# Patient Record
Sex: Female | Born: 1965 | Race: White | Hispanic: No | Marital: Married | Smoking: Never smoker
Health system: Southern US, Community
[De-identification: ages and names within clinical notes are randomized; demographics above are authoritative.]

## PROBLEM LIST (undated history)

## (undated) DIAGNOSIS — R87619 Unspecified abnormal cytological findings in specimens from cervix uteri: Secondary | ICD-10-CM

## (undated) DIAGNOSIS — I10 Essential (primary) hypertension: Secondary | ICD-10-CM

## (undated) DIAGNOSIS — T7840XA Allergy, unspecified, initial encounter: Secondary | ICD-10-CM

## (undated) DIAGNOSIS — IMO0002 Reserved for concepts with insufficient information to code with codable children: Secondary | ICD-10-CM

## (undated) HISTORY — PX: TRIGGER FINGER RELEASE: SHX641

## (undated) HISTORY — DX: Unspecified abnormal cytological findings in specimens from cervix uteri: R87.619

## (undated) HISTORY — PX: REFRACTIVE SURGERY: SHX103

## (undated) HISTORY — DX: Reserved for concepts with insufficient information to code with codable children: IMO0002

## (undated) HISTORY — PX: KNEE SURGERY: SHX244

## (undated) HISTORY — DX: Allergy, unspecified, initial encounter: T78.40XA

## (undated) HISTORY — PX: WISDOM TOOTH EXTRACTION: SHX21

## (undated) HISTORY — PX: CARPAL TUNNEL WITH CUBITAL TUNNEL: SHX5608

## (undated) HISTORY — PX: EYE SURGERY: SHX253

---

## 1998-09-17 ENCOUNTER — Other Ambulatory Visit: Admission: RE | Admit: 1998-09-17 | Discharge: 1998-09-17 | Payer: Self-pay | Admitting: Obstetrics and Gynecology

## 1999-08-28 ENCOUNTER — Emergency Department (HOSPITAL_COMMUNITY): Admission: EM | Admit: 1999-08-28 | Discharge: 1999-08-28 | Payer: Self-pay | Admitting: Emergency Medicine

## 1999-08-28 ENCOUNTER — Encounter: Payer: Self-pay | Admitting: Emergency Medicine

## 1999-11-23 ENCOUNTER — Other Ambulatory Visit: Admission: RE | Admit: 1999-11-23 | Discharge: 1999-11-23 | Payer: Self-pay | Admitting: Obstetrics and Gynecology

## 2000-09-02 ENCOUNTER — Inpatient Hospital Stay (HOSPITAL_COMMUNITY): Admission: AD | Admit: 2000-09-02 | Discharge: 2000-09-02 | Payer: Self-pay | Admitting: *Deleted

## 2000-09-29 ENCOUNTER — Inpatient Hospital Stay (HOSPITAL_COMMUNITY): Admission: RE | Admit: 2000-09-29 | Discharge: 2000-09-29 | Payer: Self-pay | Admitting: Obstetrics and Gynecology

## 2000-12-05 ENCOUNTER — Other Ambulatory Visit: Admission: RE | Admit: 2000-12-05 | Discharge: 2000-12-05 | Payer: Self-pay | Admitting: Obstetrics and Gynecology

## 2000-12-13 ENCOUNTER — Encounter: Payer: Self-pay | Admitting: Urology

## 2000-12-13 ENCOUNTER — Encounter: Admission: RE | Admit: 2000-12-13 | Discharge: 2000-12-13 | Payer: Self-pay | Admitting: Urology

## 2000-12-21 ENCOUNTER — Encounter: Payer: Self-pay | Admitting: Obstetrics and Gynecology

## 2000-12-21 ENCOUNTER — Encounter: Admission: RE | Admit: 2000-12-21 | Discharge: 2000-12-21 | Payer: Self-pay | Admitting: Obstetrics and Gynecology

## 2001-03-15 ENCOUNTER — Other Ambulatory Visit: Admission: RE | Admit: 2001-03-15 | Discharge: 2001-03-15 | Payer: Self-pay | Admitting: Obstetrics and Gynecology

## 2001-07-02 ENCOUNTER — Inpatient Hospital Stay (HOSPITAL_COMMUNITY): Admission: AD | Admit: 2001-07-02 | Discharge: 2001-07-02 | Payer: Self-pay | Admitting: Obstetrics & Gynecology

## 2001-07-03 ENCOUNTER — Inpatient Hospital Stay (HOSPITAL_COMMUNITY): Admission: AD | Admit: 2001-07-03 | Discharge: 2001-07-03 | Payer: Self-pay | Admitting: Obstetrics and Gynecology

## 2001-07-26 ENCOUNTER — Encounter: Admission: RE | Admit: 2001-07-26 | Discharge: 2001-10-24 | Payer: Self-pay | Admitting: Obstetrics and Gynecology

## 2001-08-12 ENCOUNTER — Inpatient Hospital Stay (HOSPITAL_COMMUNITY): Admission: AD | Admit: 2001-08-12 | Discharge: 2001-08-12 | Payer: Self-pay | Admitting: Obstetrics and Gynecology

## 2001-09-17 ENCOUNTER — Inpatient Hospital Stay (HOSPITAL_COMMUNITY): Admission: AD | Admit: 2001-09-17 | Discharge: 2001-09-17 | Payer: Self-pay | Admitting: Obstetrics and Gynecology

## 2001-09-18 ENCOUNTER — Inpatient Hospital Stay (HOSPITAL_COMMUNITY): Admission: AD | Admit: 2001-09-18 | Discharge: 2001-09-20 | Payer: Self-pay | Admitting: Obstetrics and Gynecology

## 2001-12-12 ENCOUNTER — Other Ambulatory Visit: Admission: RE | Admit: 2001-12-12 | Discharge: 2001-12-12 | Payer: Self-pay | Admitting: Obstetrics and Gynecology

## 2002-10-01 ENCOUNTER — Ambulatory Visit (HOSPITAL_COMMUNITY): Admission: RE | Admit: 2002-10-01 | Discharge: 2002-10-01 | Payer: Self-pay | Admitting: Gastroenterology

## 2002-12-18 ENCOUNTER — Other Ambulatory Visit: Admission: RE | Admit: 2002-12-18 | Discharge: 2002-12-18 | Payer: Self-pay | Admitting: Obstetrics and Gynecology

## 2003-12-30 ENCOUNTER — Other Ambulatory Visit: Admission: RE | Admit: 2003-12-30 | Discharge: 2003-12-30 | Payer: Self-pay | Admitting: Obstetrics and Gynecology

## 2004-01-14 ENCOUNTER — Encounter: Admission: RE | Admit: 2004-01-14 | Discharge: 2004-01-14 | Payer: Self-pay | Admitting: Obstetrics and Gynecology

## 2004-12-30 ENCOUNTER — Other Ambulatory Visit: Admission: RE | Admit: 2004-12-30 | Discharge: 2004-12-30 | Payer: Self-pay | Admitting: Obstetrics and Gynecology

## 2005-11-08 ENCOUNTER — Encounter: Admission: RE | Admit: 2005-11-08 | Discharge: 2005-11-08 | Payer: Self-pay | Admitting: Obstetrics and Gynecology

## 2005-11-08 IMAGING — MG MM MAMMO SCREENING
6 series · 6 of 6 positions shown · non-contrast
Comparison: none

SCREENING MAMMOGRAM:

Comparison is made to previous study dated [DATE].
The breast tissue is extremely dense.  There is no dominant mass, architectural distortion or 
calcification to suggest malignancy.

[R CC]
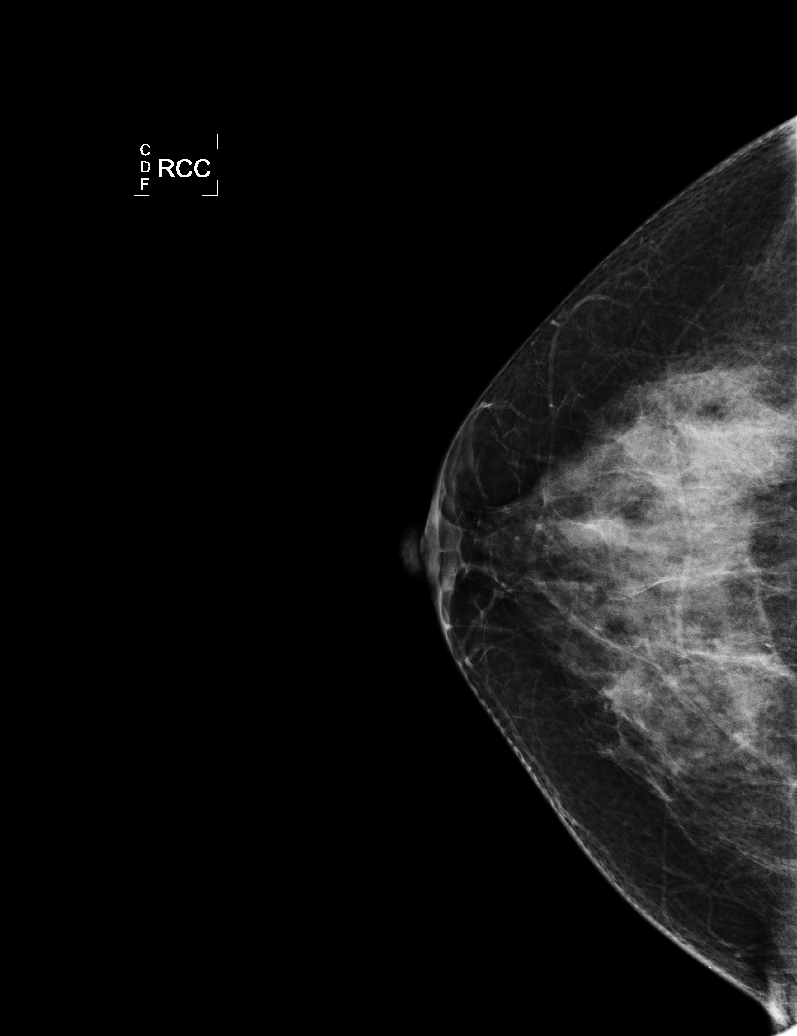

[R MLO (1 of 2)]
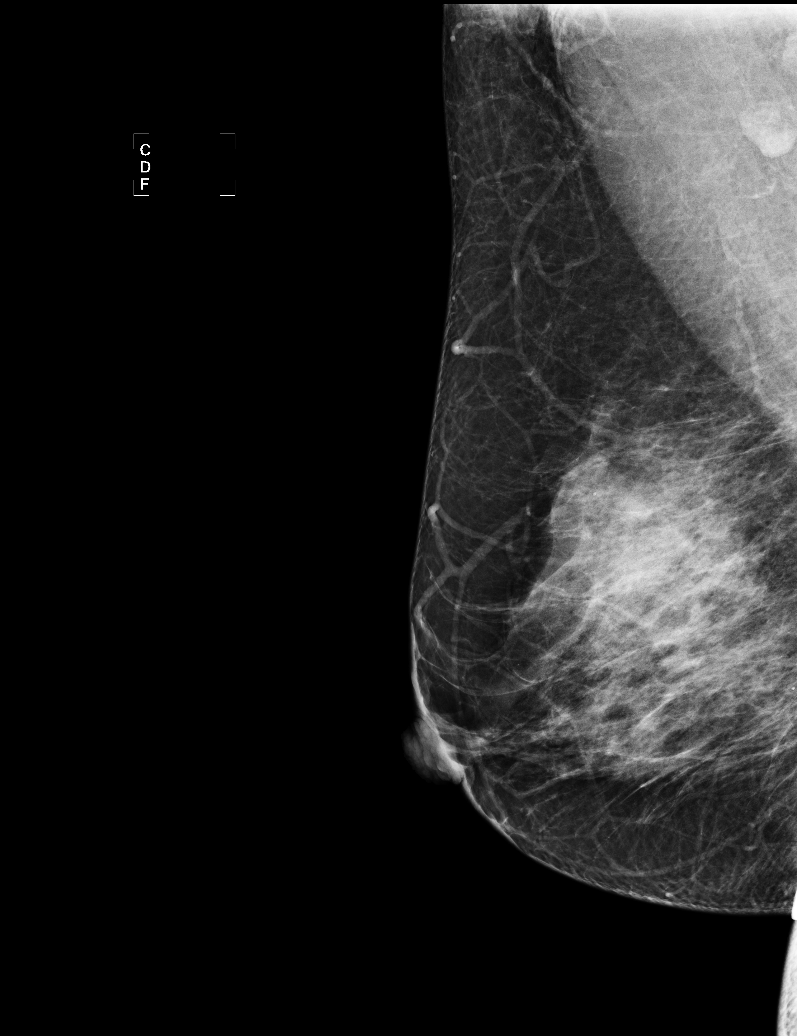

[L CC]
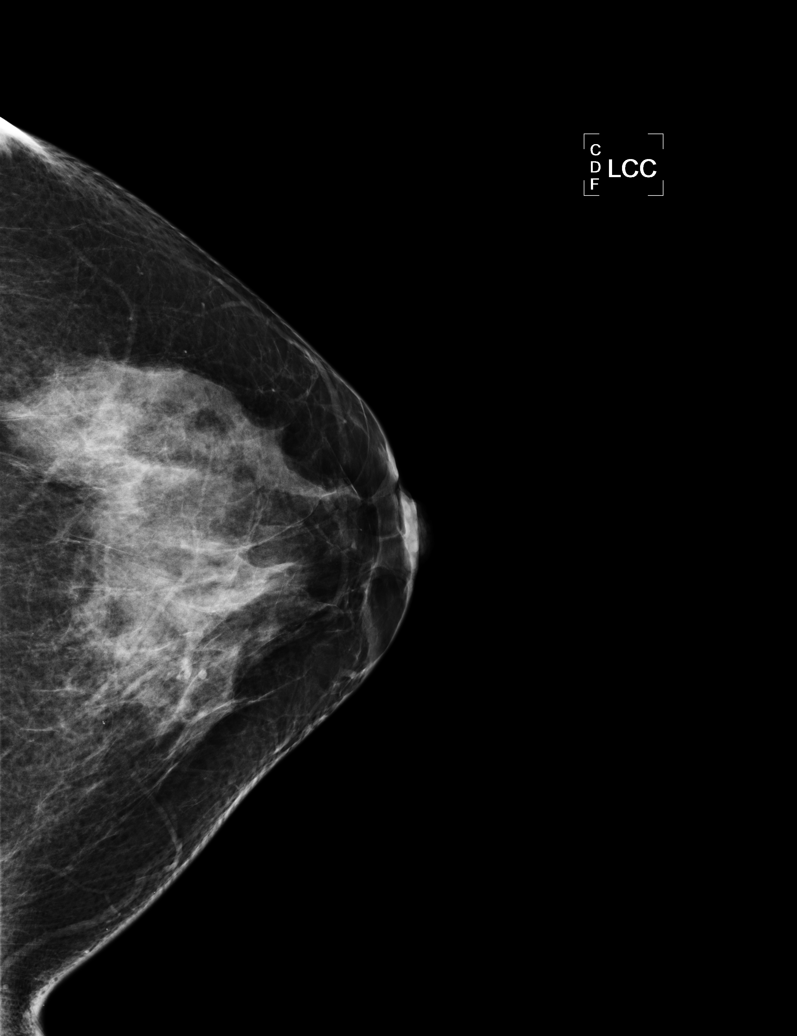

[L MLO (1 of 2)]
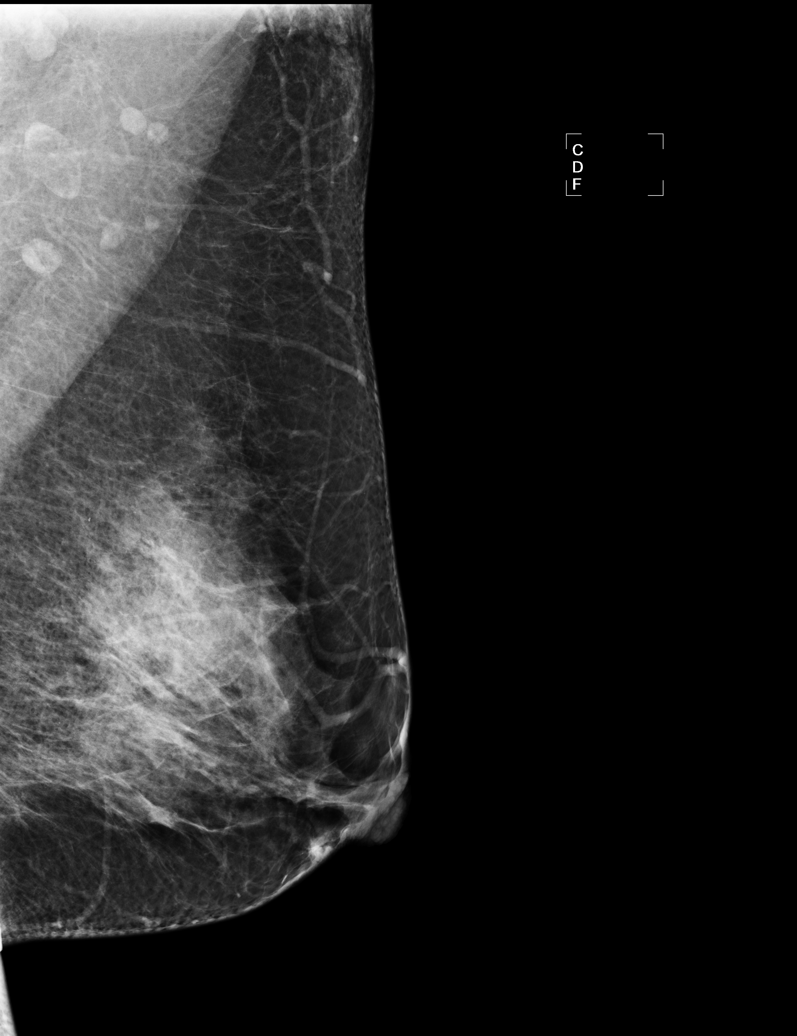

[R MLO (2 of 2)]
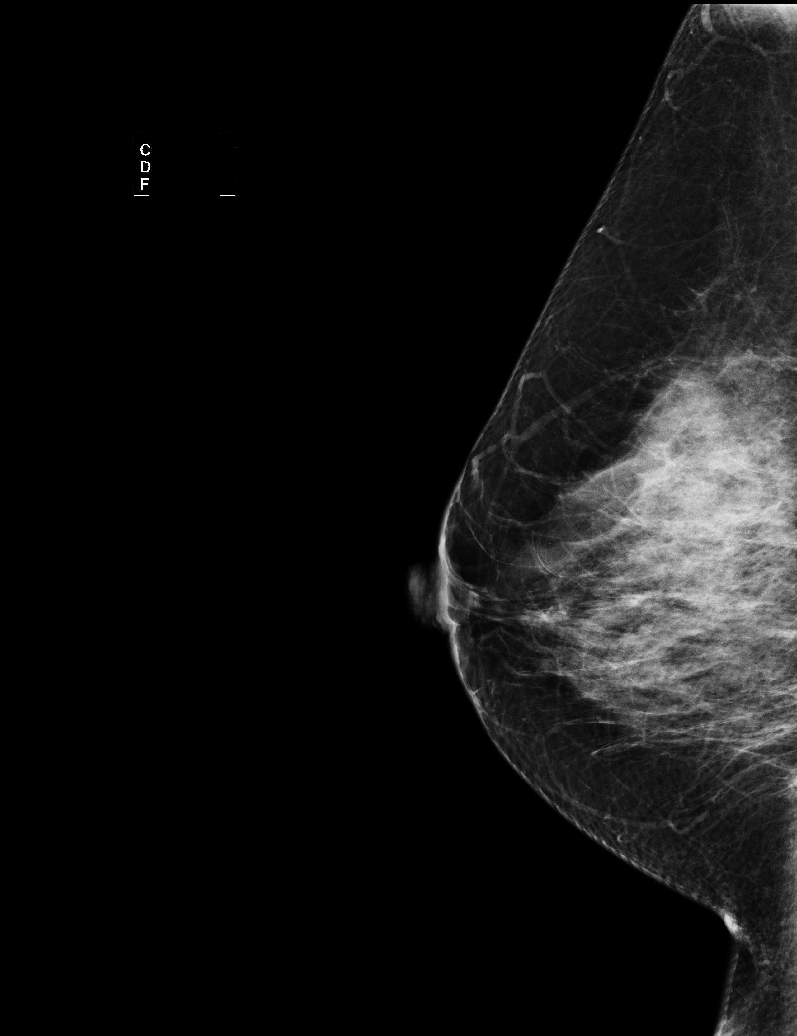

[L MLO (2 of 2)]
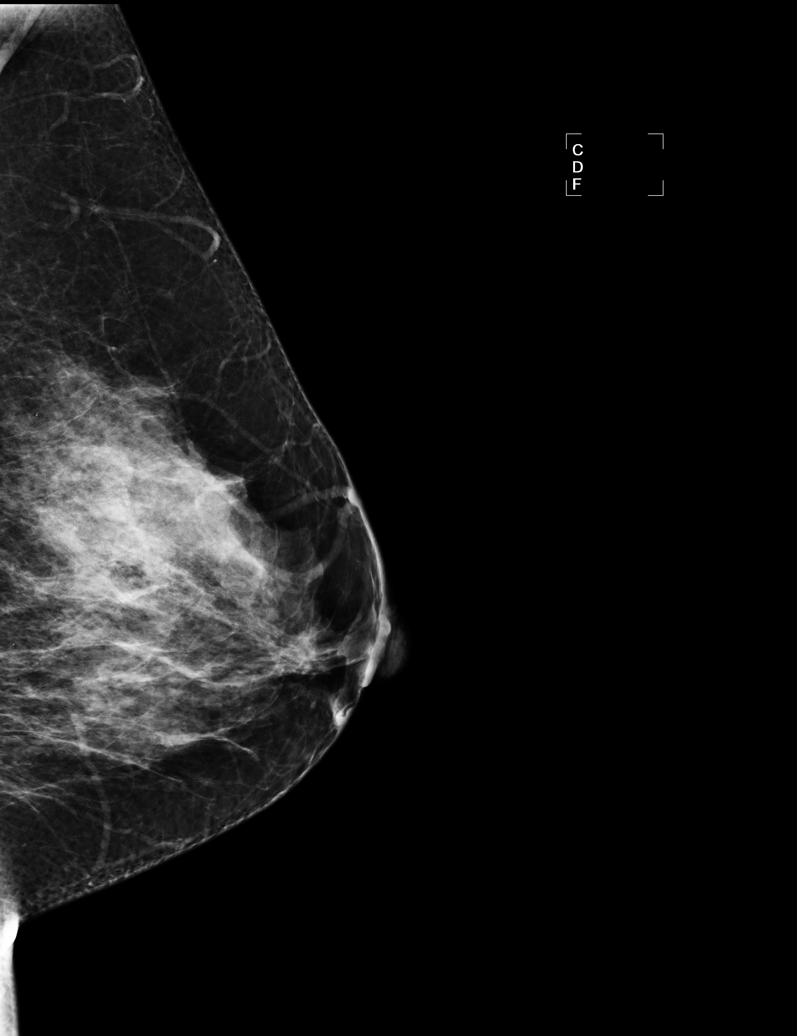

[6 of 6 positions shown; findings below may reference images not displayed]

IMPRESSION: No mammographic evidence of malignancy.  Suggest screening mammography in one year.

ASSESSMENT: Negative - BI-RADS 1

Screening mammogram in 1 year.

## 2006-01-04 ENCOUNTER — Other Ambulatory Visit: Admission: RE | Admit: 2006-01-04 | Discharge: 2006-01-04 | Payer: Self-pay | Admitting: Obstetrics and Gynecology

## 2007-01-02 ENCOUNTER — Encounter: Admission: RE | Admit: 2007-01-02 | Discharge: 2007-01-02 | Payer: Self-pay | Admitting: Obstetrics and Gynecology

## 2007-01-02 IMAGING — MG MM SCREEN MAMMOGRAM BILATERAL
6 series · 6 of 6 positions shown · non-contrast
Comparison: none

DG SCREEN MAMMOGRAM BILATERAL
Bilateral CC and MLO view(s) were taken.
Technologist: CLEMENCEAU

DIGITAL SCREENING MAMMOGRAM WITH CAD:
There is a  dense fibroglandular pattern.  No masses or malignant type calcifications are 
identified.  Compared with prior studies.

[R CC (1 of 2)]
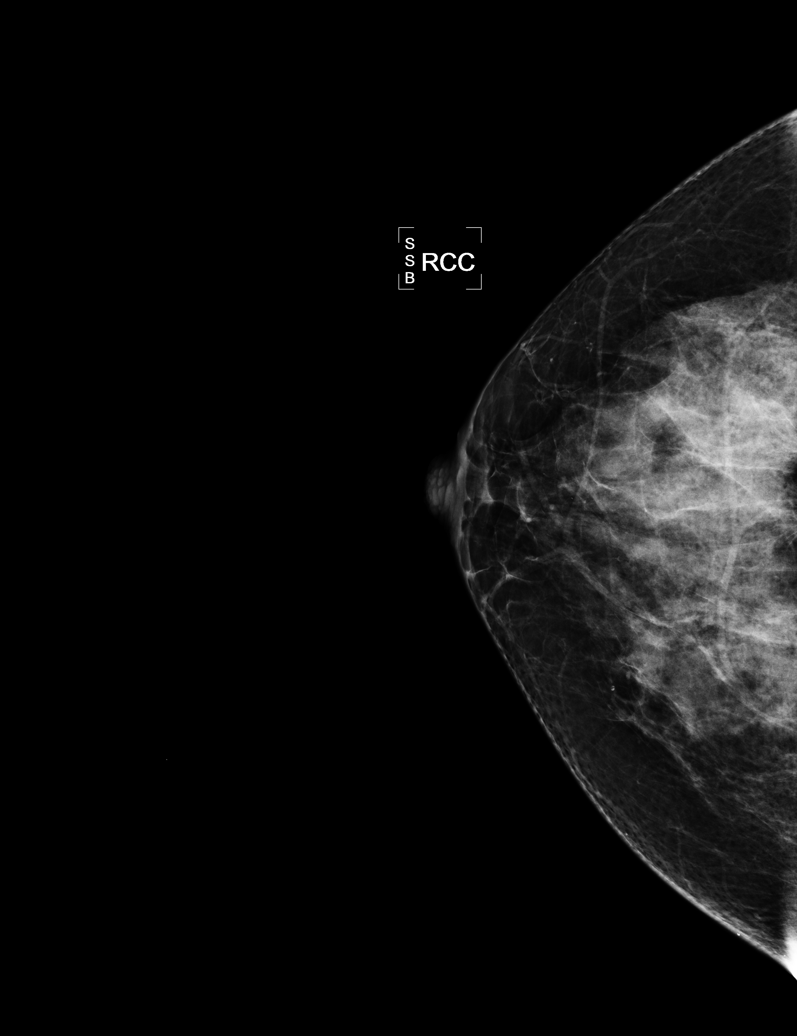

[L CC (1 of 2)]
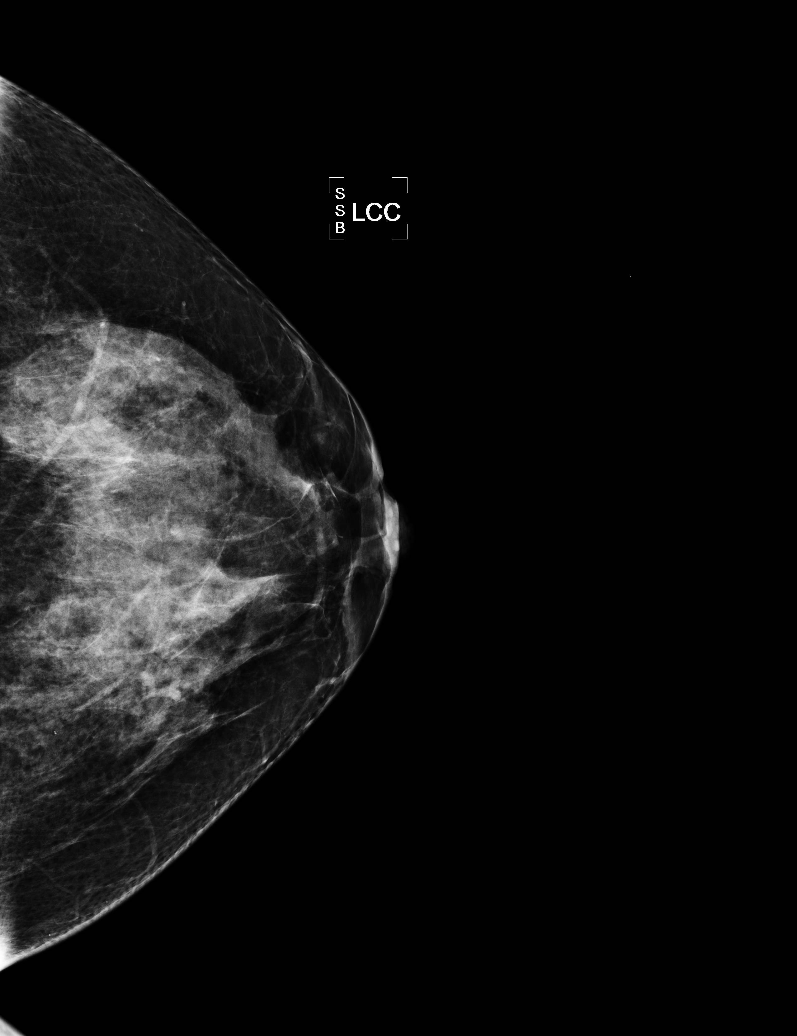

[L MLO]
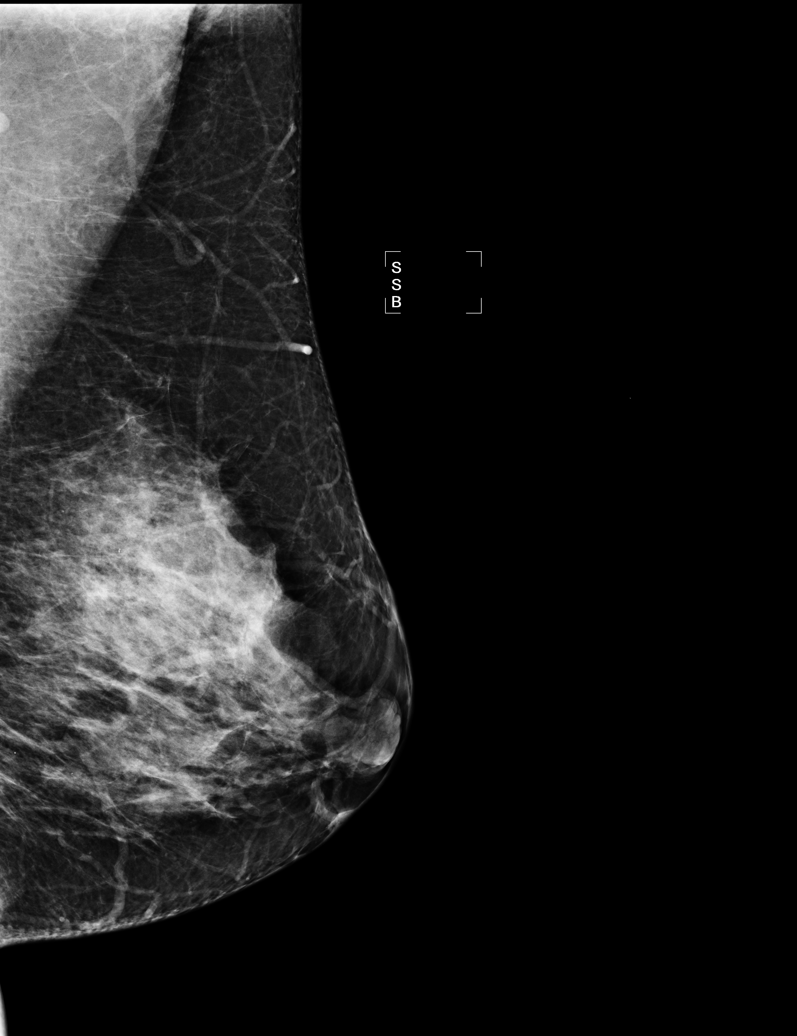

[R MLO]
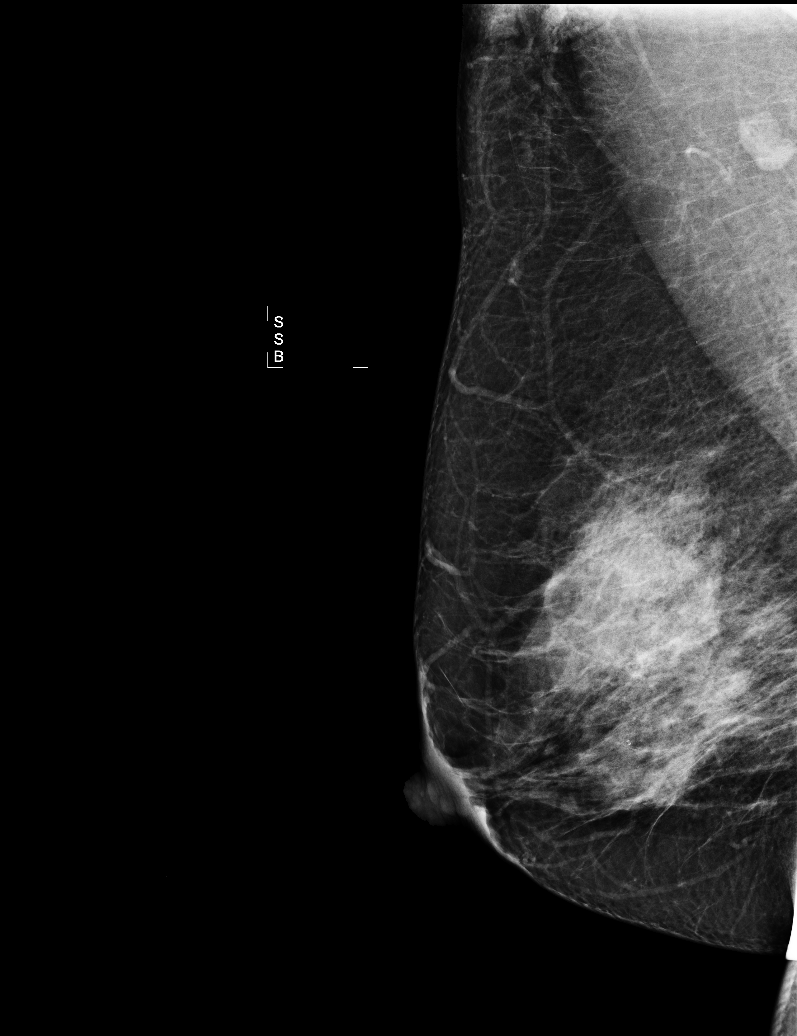

[R CC (2 of 2)]
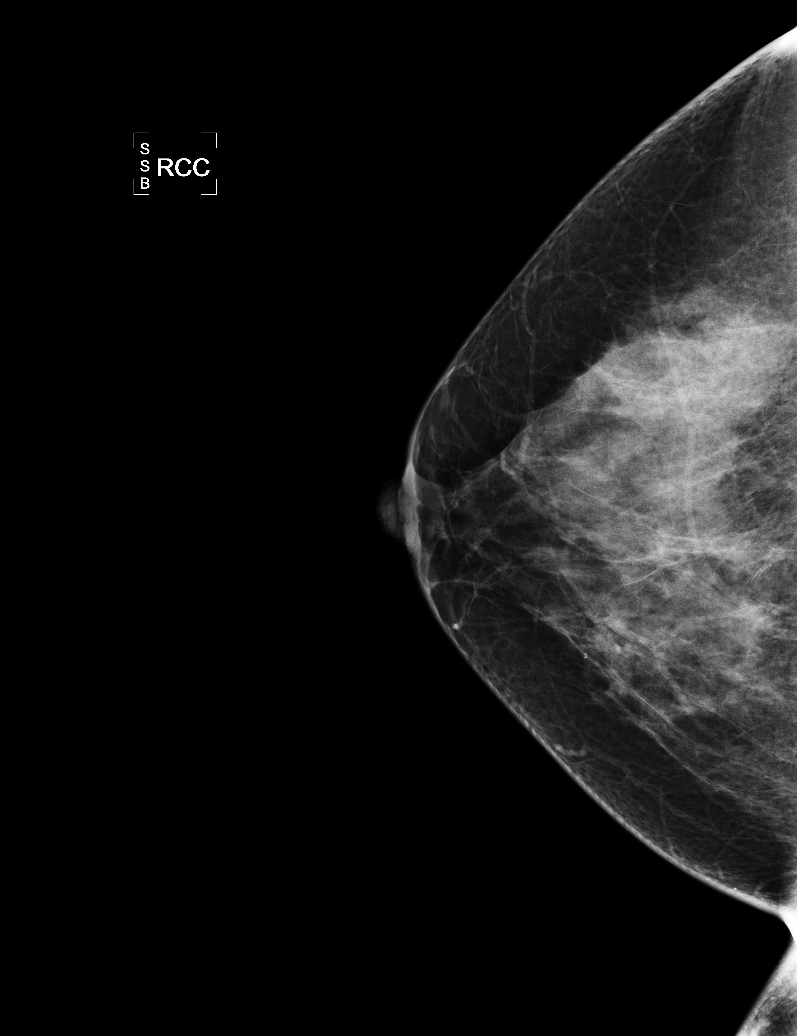

[L CC (2 of 2)]
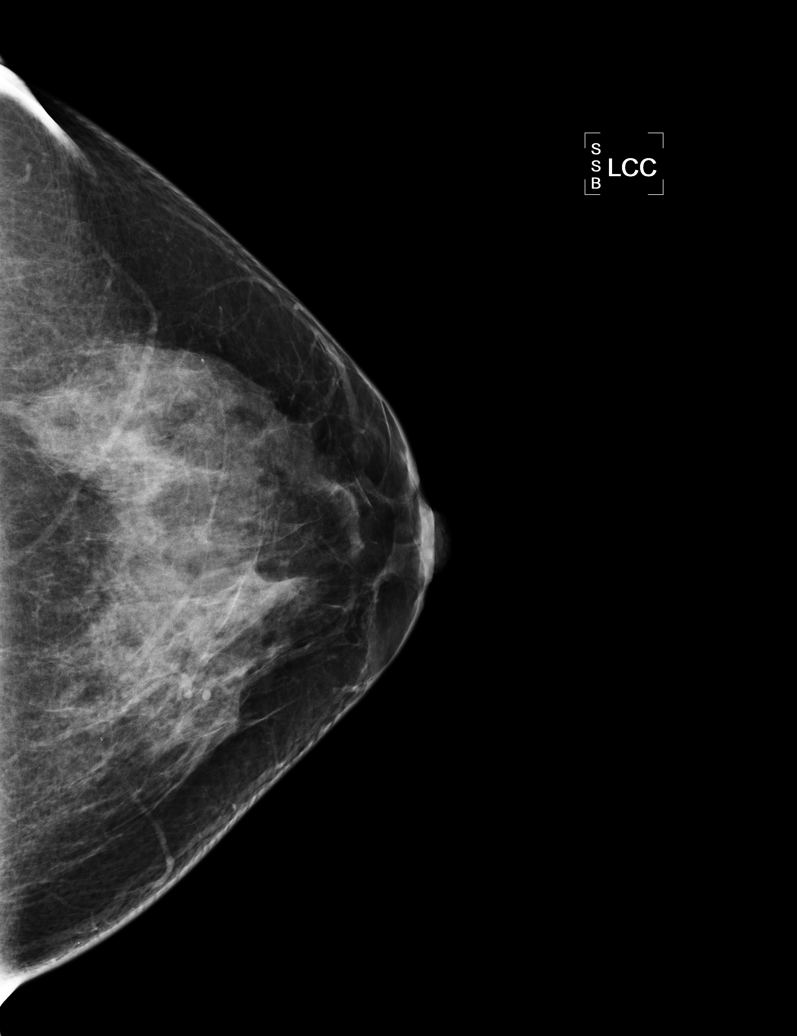

[6 of 6 positions shown; findings below may reference images not displayed]

IMPRESSION: No specific mammographic evidence of malignancy.  Next screening mammogram is recommended in one 
year.

ASSESSMENT: Negative - BI-RADS 1

Screening mammogram in 1 year.
ANALYZED BY COMPUTER AIDED DETECTION. , THIS PROCEDURE WAS A DIGITAL MAMMOGRAM.

## 2007-06-23 ENCOUNTER — Emergency Department (HOSPITAL_COMMUNITY): Admission: EM | Admit: 2007-06-23 | Discharge: 2007-06-23 | Payer: Self-pay | Admitting: Emergency Medicine

## 2007-08-19 ENCOUNTER — Emergency Department (HOSPITAL_COMMUNITY): Admission: EM | Admit: 2007-08-19 | Discharge: 2007-08-19 | Payer: Self-pay | Admitting: Emergency Medicine

## 2007-08-19 IMAGING — CR DG CHEST 1V PORT
1 series · 1 of 1 positions shown · non-contrast
Comparison: None.

CLINICAL DATA: 41-year-old female with chest pain. 
 PORTABLE CHEST - 1 VIEW:

[AP]
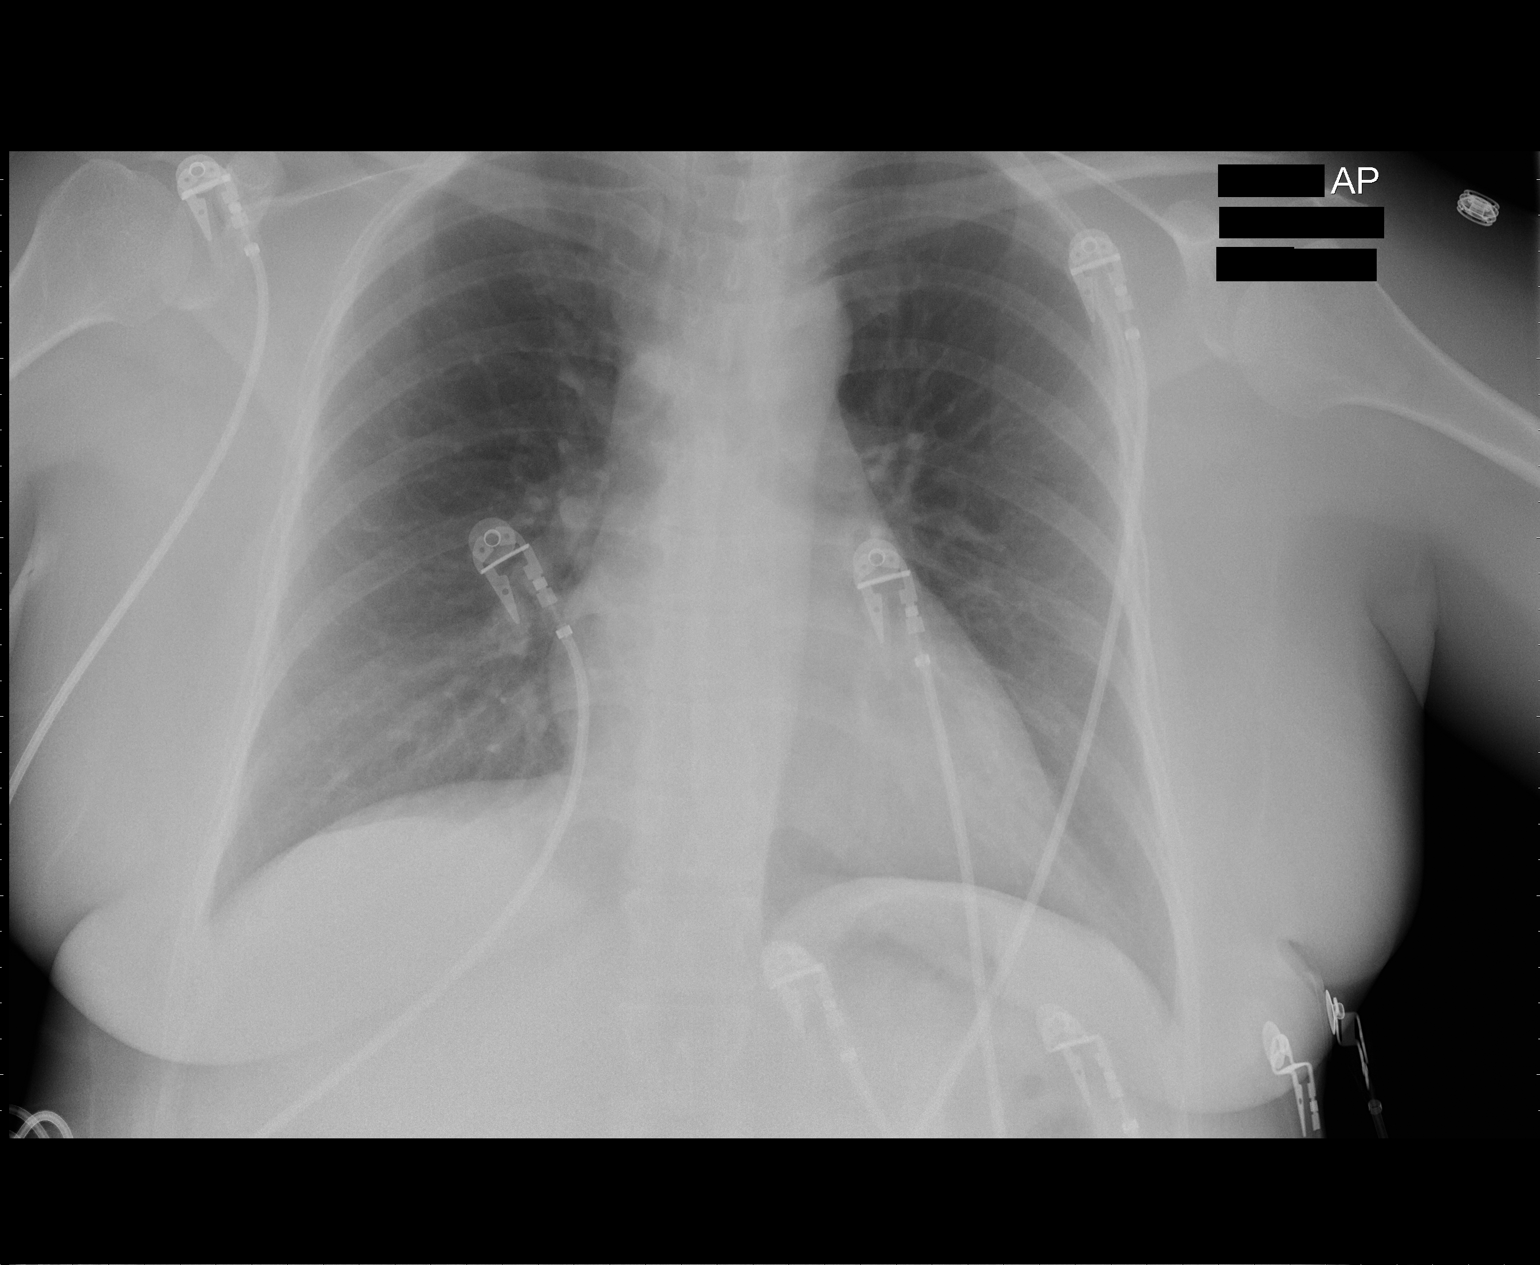

[1 of 1 positions shown; findings below may reference images not displayed]

FINDINGS: The heart size and mediastinal contours are within normal limits.  Both lungs are clear.
IMPRESSION: No acute findings.

## 2007-09-04 ENCOUNTER — Ambulatory Visit: Payer: Self-pay | Admitting: Internal Medicine

## 2007-09-12 ENCOUNTER — Ambulatory Visit: Payer: Self-pay

## 2007-10-31 ENCOUNTER — Ambulatory Visit: Payer: Self-pay | Admitting: Internal Medicine

## 2008-01-08 ENCOUNTER — Encounter: Admission: RE | Admit: 2008-01-08 | Discharge: 2008-01-08 | Payer: Self-pay | Admitting: Obstetrics and Gynecology

## 2008-01-08 IMAGING — MG MM DIGITAL SCREENING
5 series · 5 of 5 positions shown · non-contrast
Comparison: Prior studies.

DG SCREEN MAMMOGRAM BILATERAL
Bilateral CC and MLO view(s) were taken.
Technologist: TORRES
Prior study comparison: [DATE], bilateral screening mammogram, performed at the [REDACTED].

DIGITAL SCREENING MAMMOGRAM WITH CAD:

[R CC (1 of 2)]
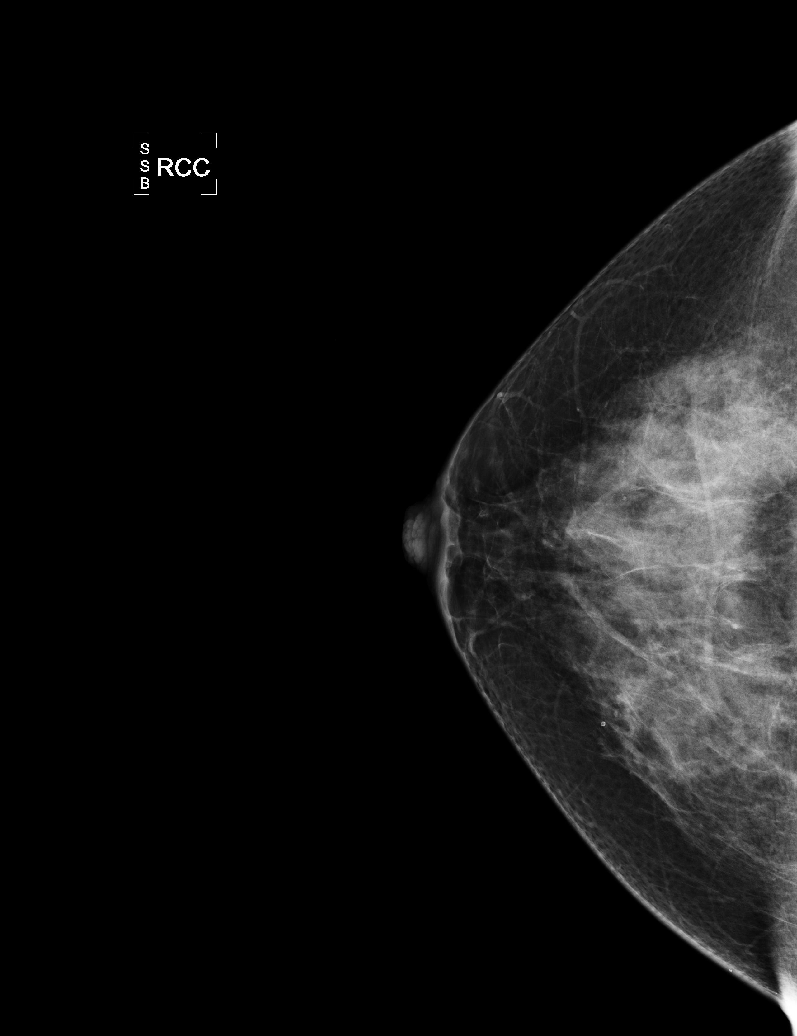

[L CC]
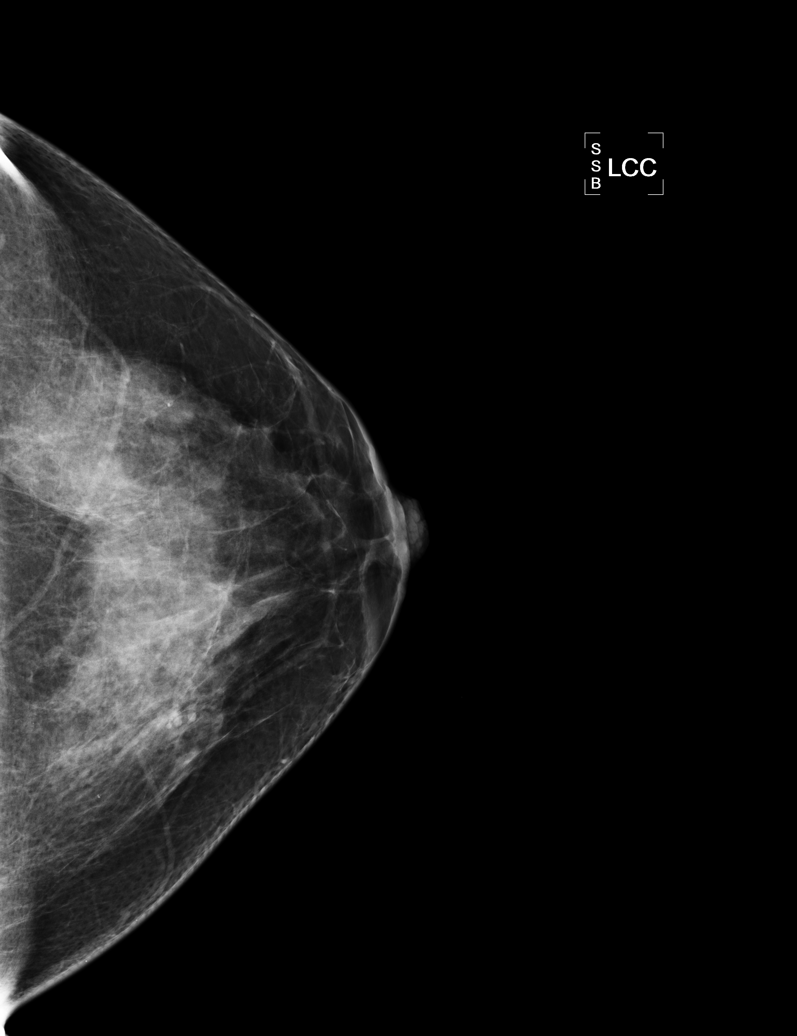

[L MLO]
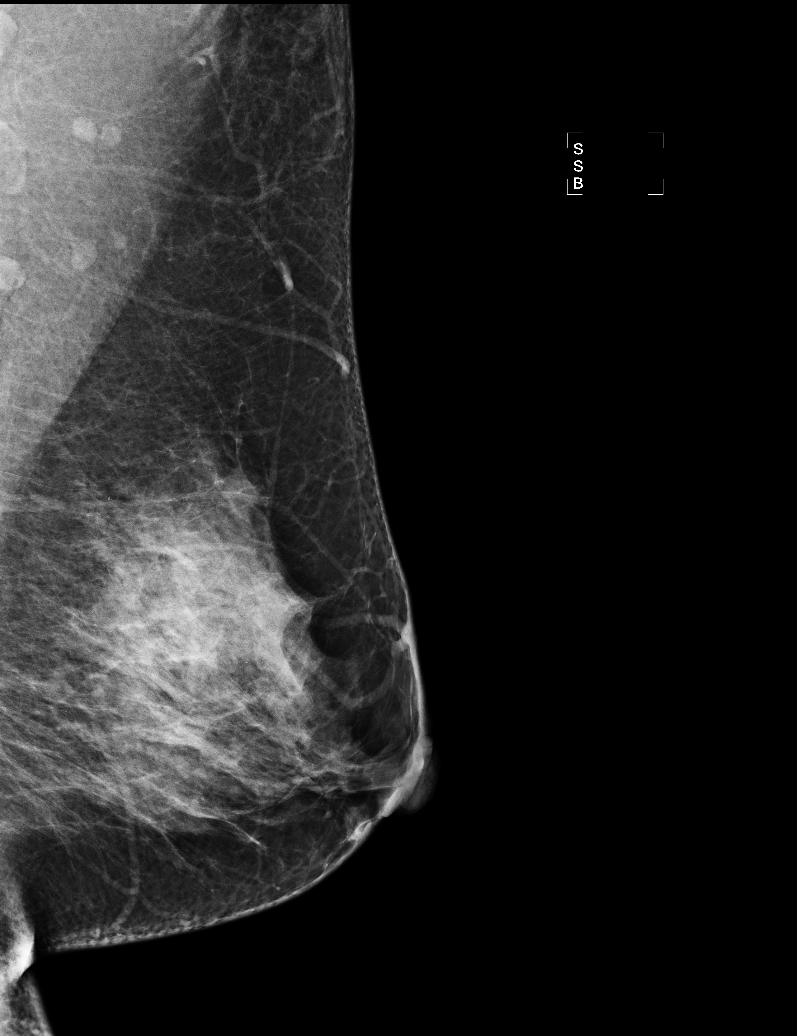

[R MLO]
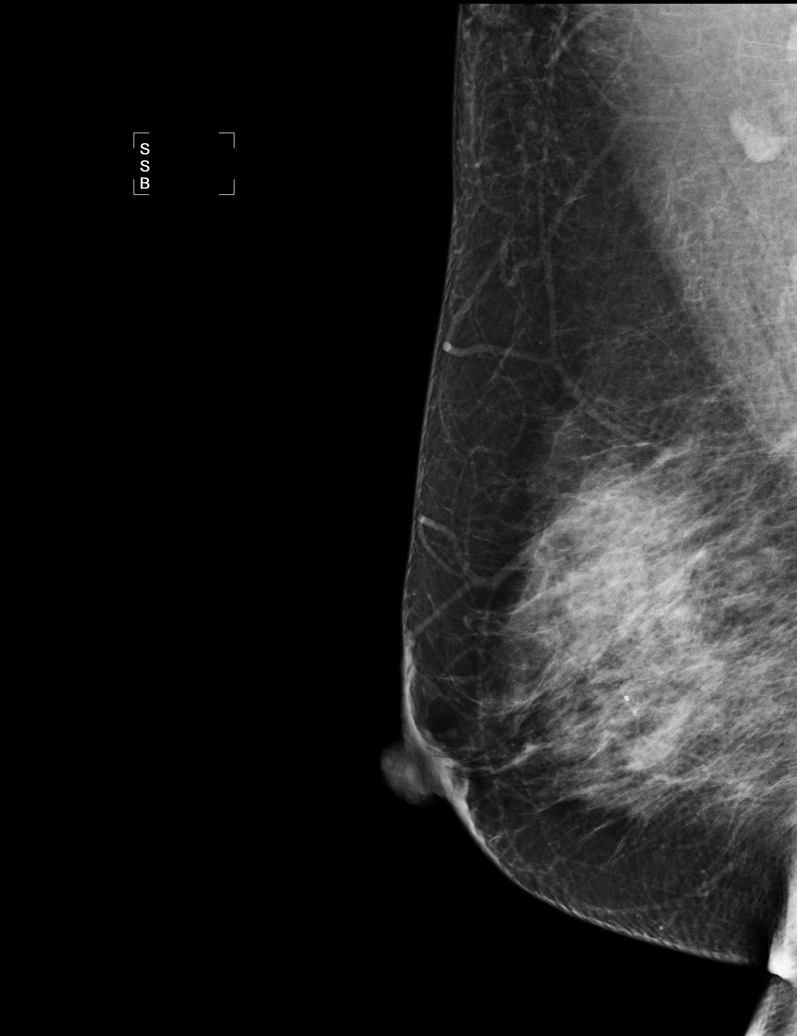

[R CC (2 of 2)]
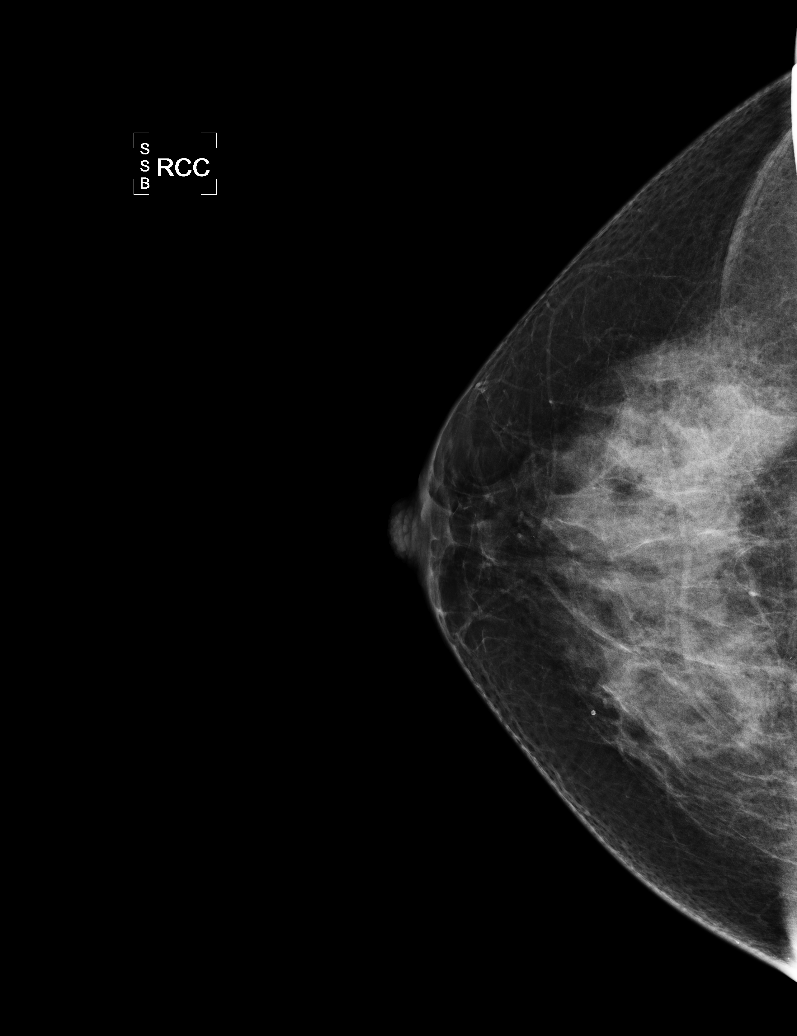

[5 of 5 positions shown; findings below may reference images not displayed]

The breast tissue is extremely dense.  There is no dominant mass, architectural distortion or 
calcification to suggest malignancy.
IMPRESSION: No mammographic evidence of malignancy.  Suggest yearly screening mammography.

ASSESSMENT: Negative - BI-RADS 1

Screening mammogram in 1 year.
THIS WAS ANALAYZED BY COMPUTER AIDED DETECTION. , THIS PROCEDURE WAS A DIGITAL MAMMOGRAM.

## 2009-01-18 ENCOUNTER — Encounter: Admission: RE | Admit: 2009-01-18 | Discharge: 2009-01-18 | Payer: Self-pay | Admitting: Obstetrics and Gynecology

## 2009-01-18 IMAGING — MG MM DIGITAL SCREENING
4 series · 4 of 4 positions shown · non-contrast
Comparison: none

DG SCREEN MAMMOGRAM BILATERAL
Bilateral CC and MLO view(s) were taken.
Technologist: ARVIN

DIGITAL SCREENING MAMMOGRAM WITH CAD:
The breast tissue is extremely dense.  No masses or malignant type calcifications are identified.  
Compared with prior studies.

[R CC]
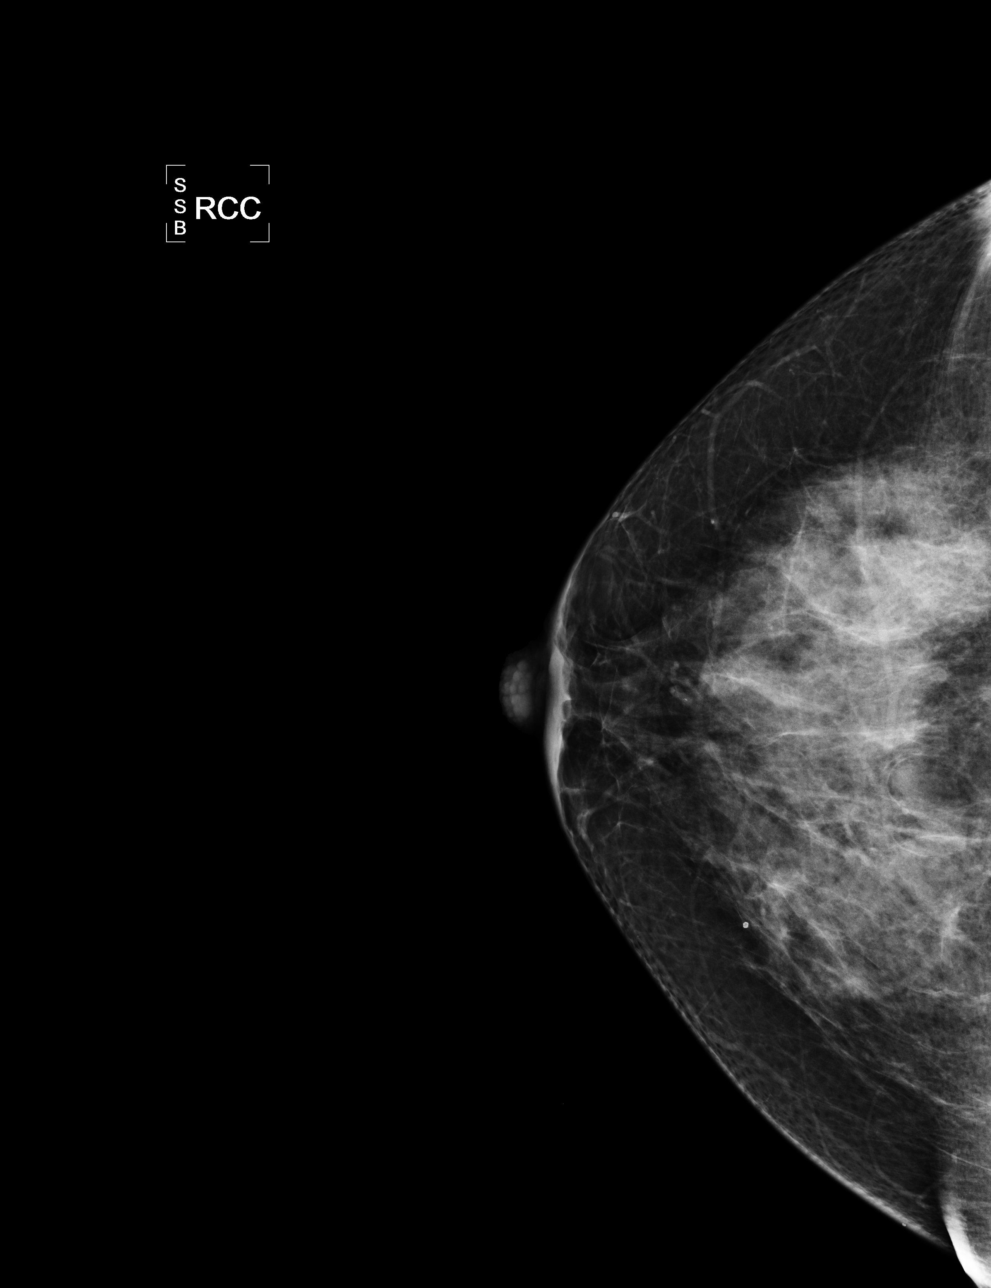

[L CC]
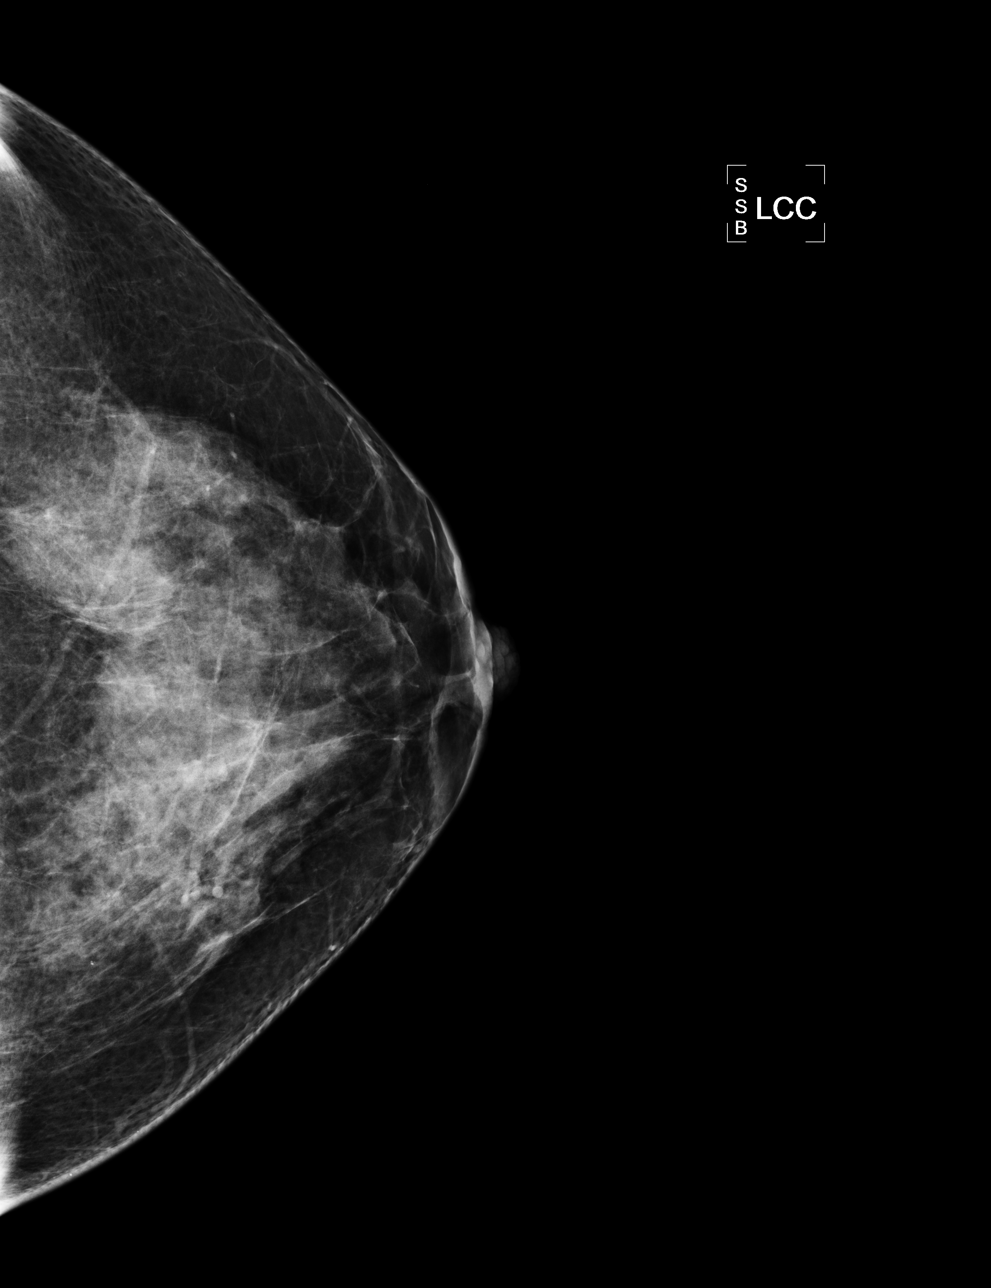

[L MLO]
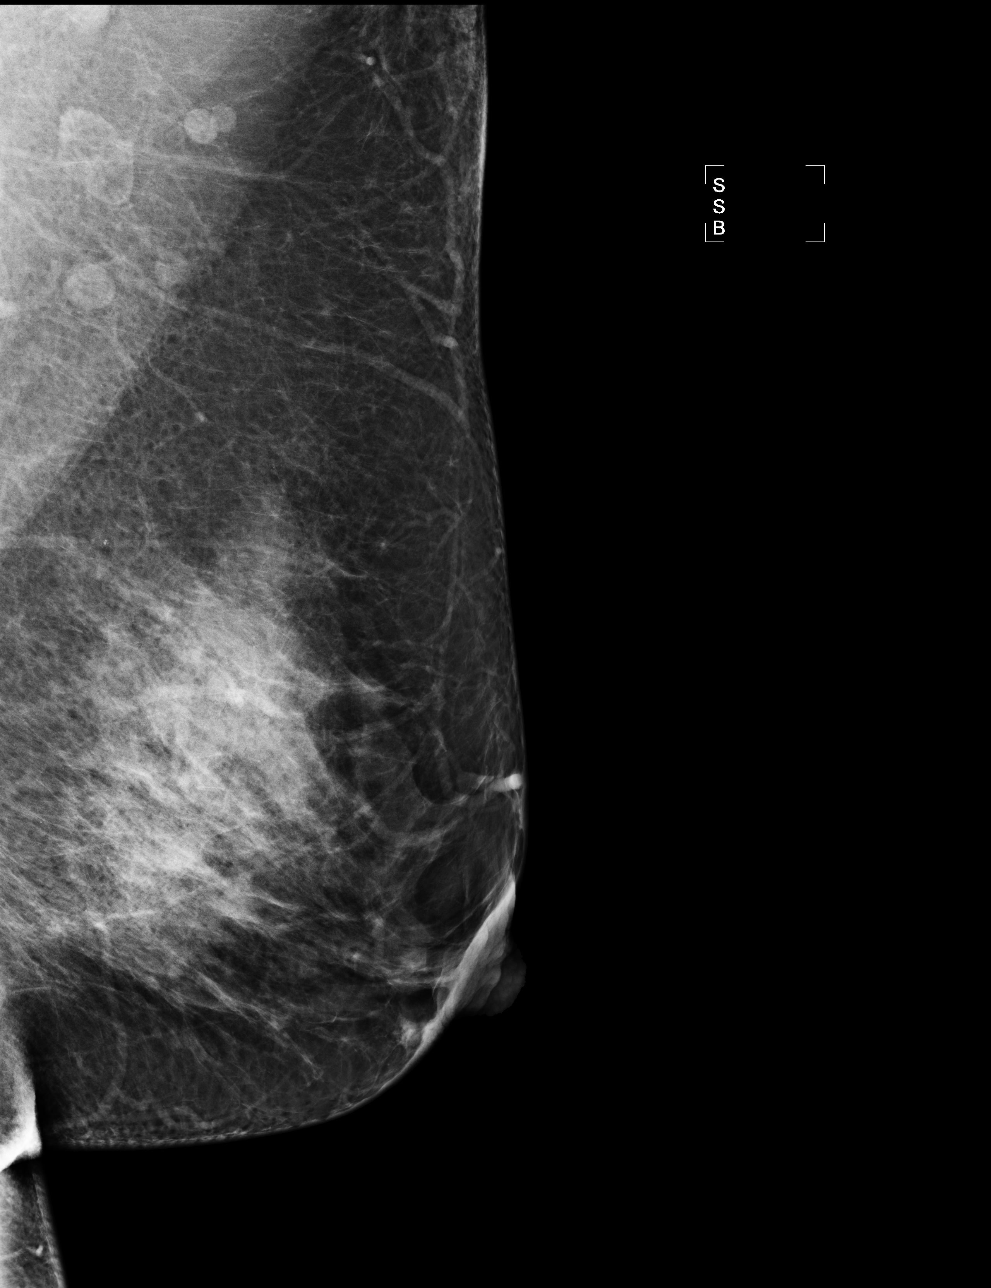

[R MLO]
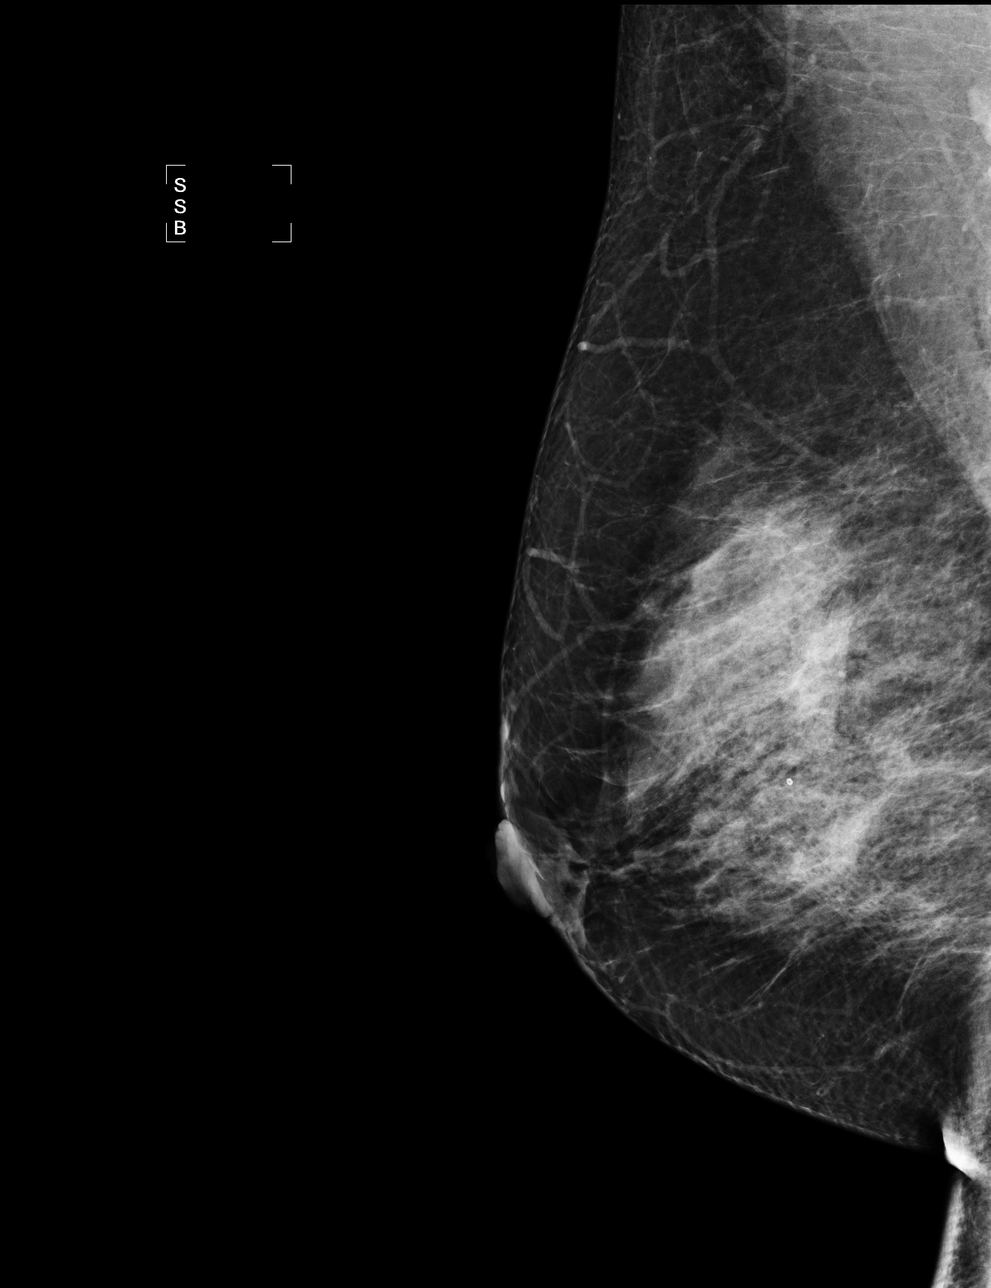

[4 of 4 positions shown; findings below may reference images not displayed]

IMPRESSION: No specific mammographic evidence of malignancy.  Next screening mammogram is recommended in one 
year.

A result letter of this screening mammogram will be mailed directly to the patient.

ASSESSMENT: Negative - BI-RADS 1

Screening mammogram in 1 year.
THIS WAS ANALAYZED BY COMPUTER AIDED DETECTION. , THIS PROCEDURE WAS A DIGITAL MAMMOGRAM.

## 2009-12-16 ENCOUNTER — Encounter: Payer: Self-pay | Admitting: Internal Medicine

## 2009-12-27 DIAGNOSIS — R0602 Shortness of breath: Secondary | ICD-10-CM

## 2009-12-27 DIAGNOSIS — R002 Palpitations: Secondary | ICD-10-CM

## 2009-12-27 DIAGNOSIS — I498 Other specified cardiac arrhythmias: Secondary | ICD-10-CM

## 2009-12-27 DIAGNOSIS — R61 Generalized hyperhidrosis: Secondary | ICD-10-CM

## 2009-12-27 HISTORY — DX: Other specified cardiac arrhythmias: I49.8

## 2009-12-27 HISTORY — DX: Generalized hyperhidrosis: R61

## 2009-12-27 HISTORY — DX: Shortness of breath: R06.02

## 2009-12-27 HISTORY — DX: Palpitations: R00.2

## 2009-12-28 ENCOUNTER — Ambulatory Visit: Payer: Self-pay | Admitting: Internal Medicine

## 2010-01-05 ENCOUNTER — Ambulatory Visit (HOSPITAL_BASED_OUTPATIENT_CLINIC_OR_DEPARTMENT_OTHER): Admission: RE | Admit: 2010-01-05 | Discharge: 2010-01-05 | Payer: Self-pay | Admitting: Orthopedic Surgery

## 2010-04-26 ENCOUNTER — Encounter: Admission: RE | Admit: 2010-04-26 | Discharge: 2010-04-26 | Payer: Self-pay | Admitting: Obstetrics and Gynecology

## 2010-04-26 IMAGING — MG MM DIGITAL SCREENING
7 series · 7 of 7 positions shown · non-contrast
Comparison: Prior studies.

DG SCREEN MAMMOGRAM BILATERAL
Bilateral CC and MLO view(s) were taken.
of [HOSPITAL].

DIGITAL SCREENING MAMMOGRAM WITH CAD:

[R CC]
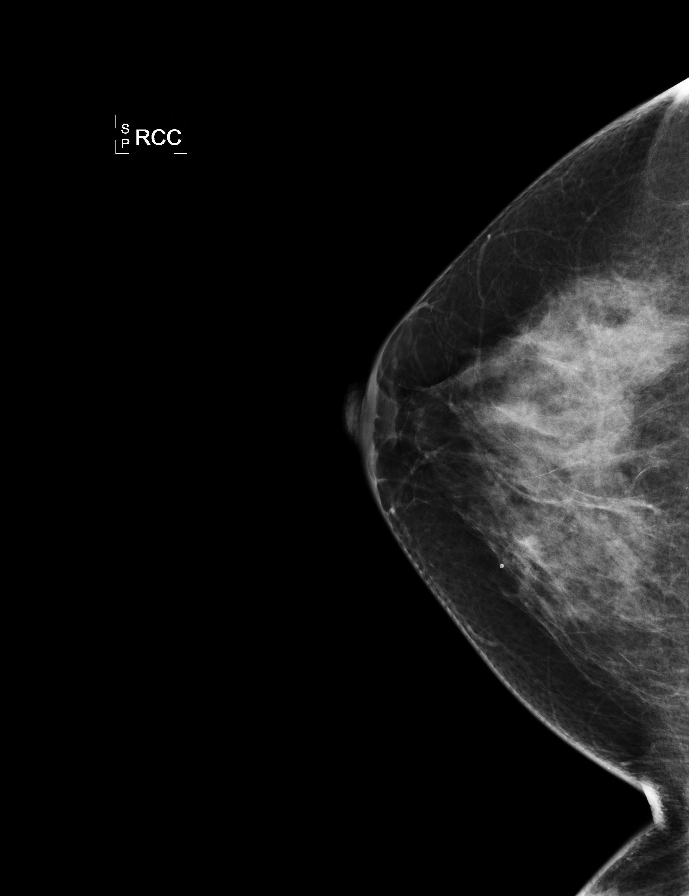

[L CC]
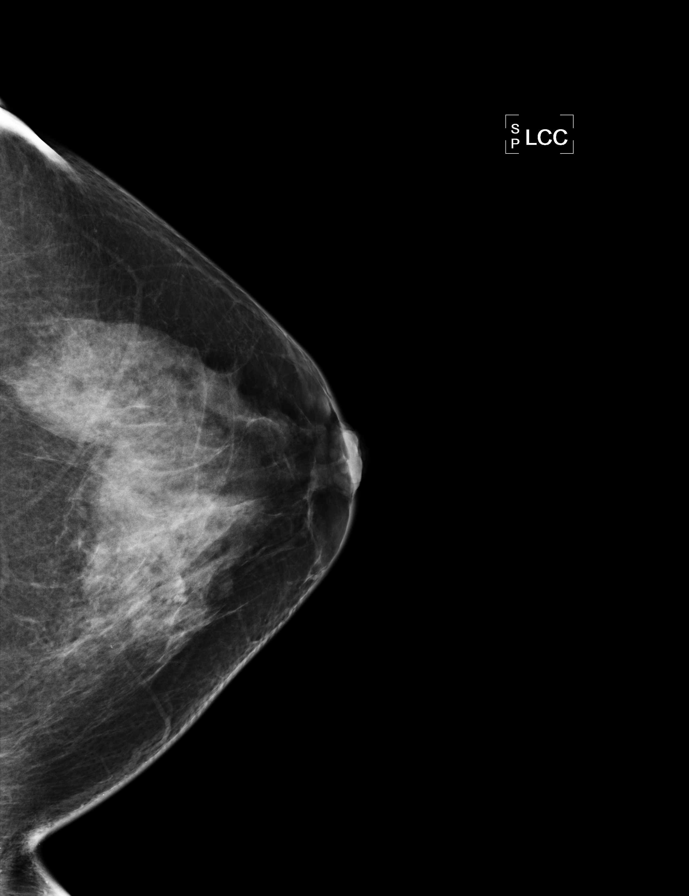

[L MLO (1 of 2)]
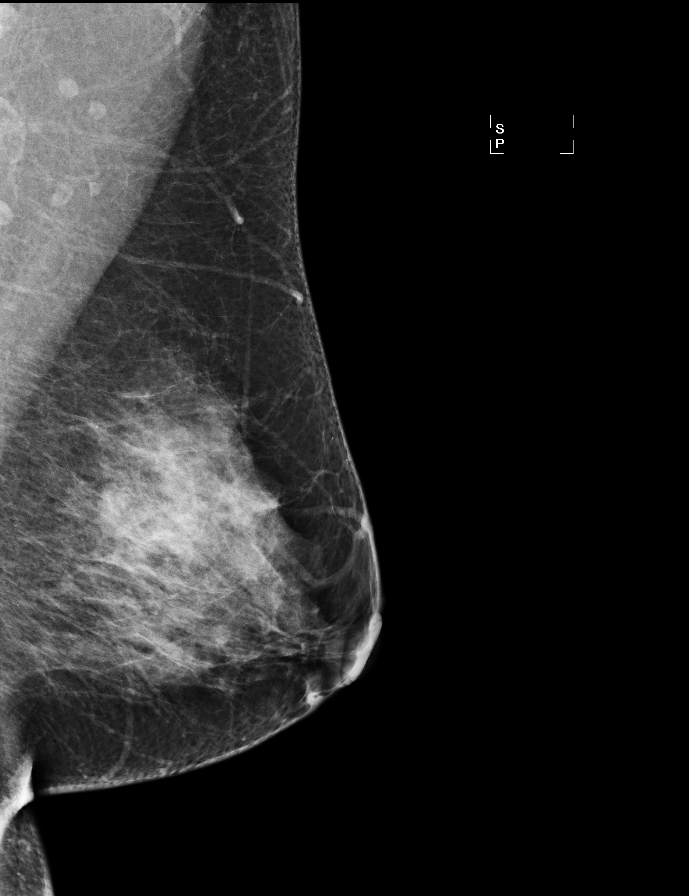

[R MLO (1 of 3)]
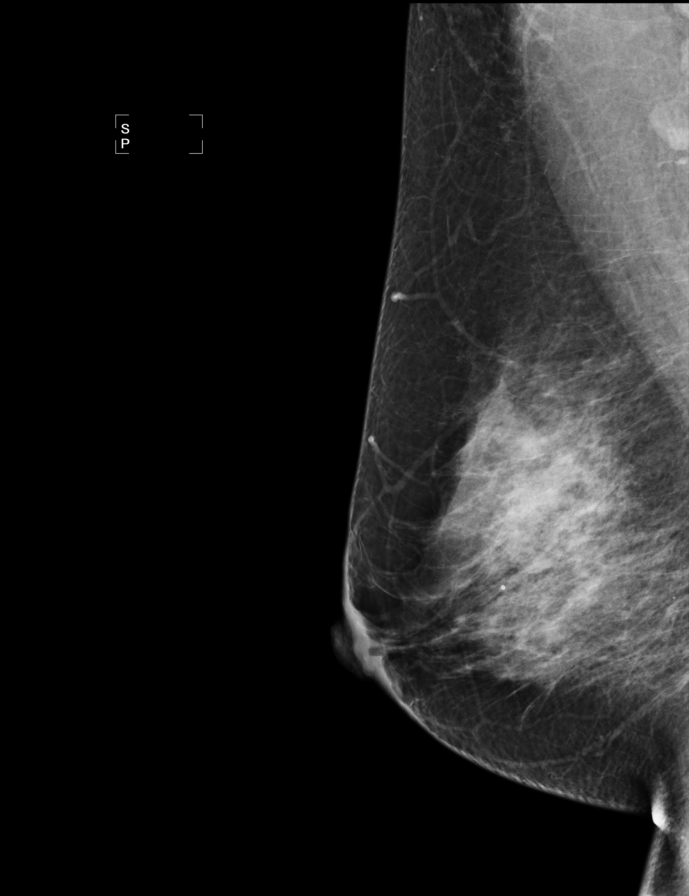

[L MLO (2 of 2)]
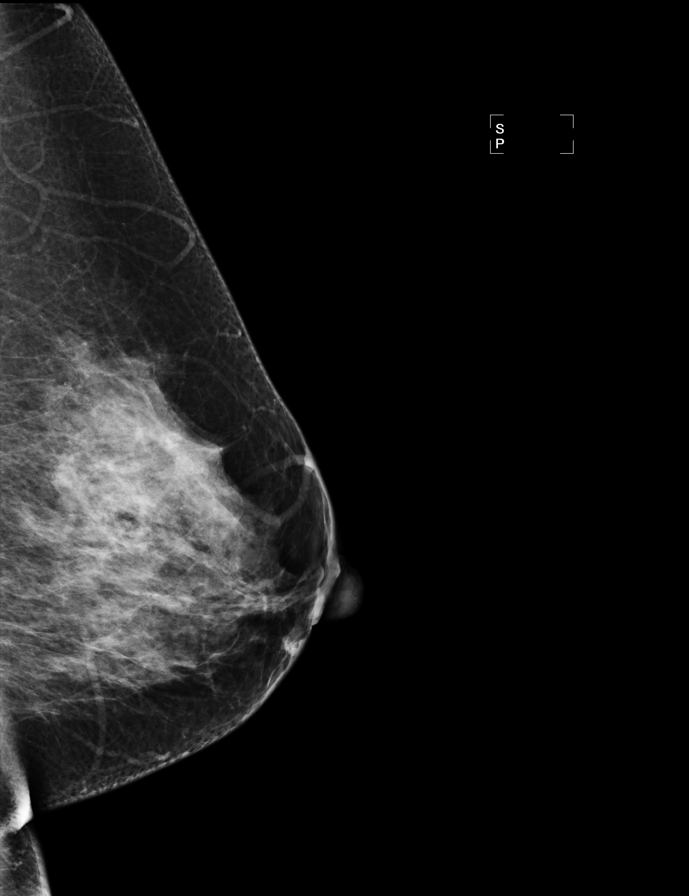

[R MLO (2 of 3)]
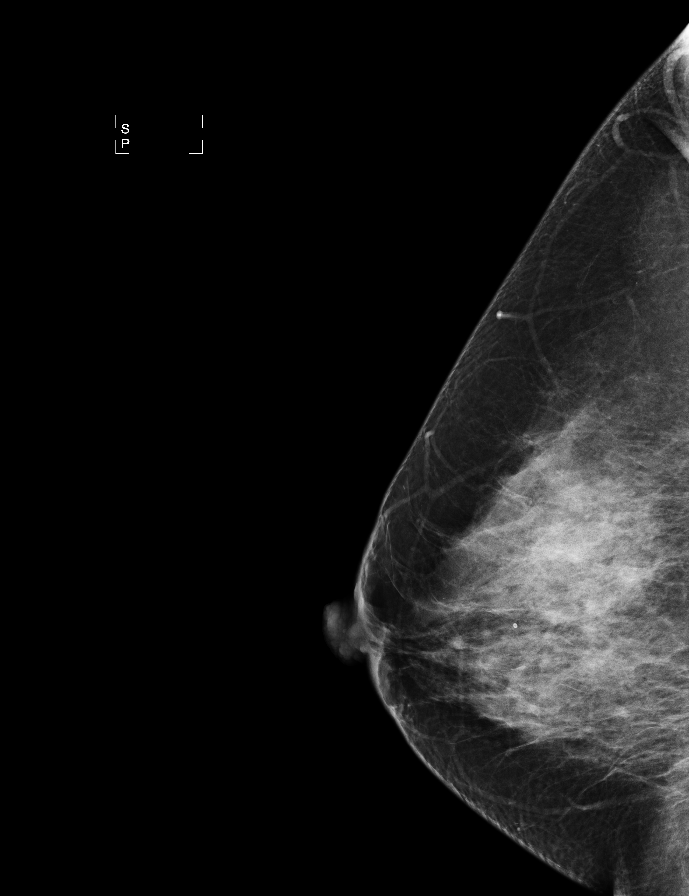

[R MLO (3 of 3)]
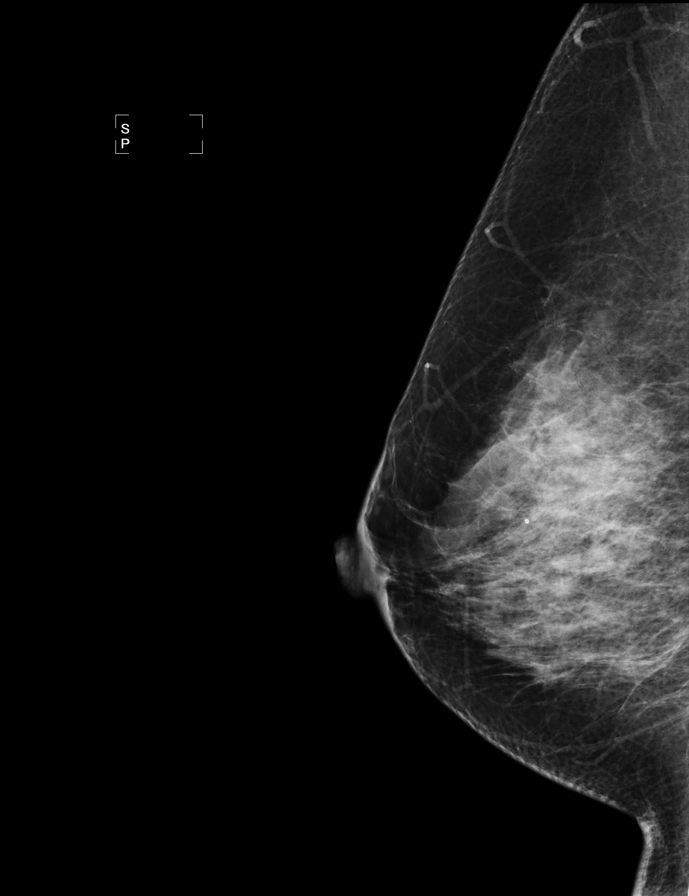

[7 of 7 positions shown; findings below may reference images not displayed]

The breast tissue is heterogeneously dense.  There is no dominant mass, architectural distortion or
calcification to suggest malignancy.

Images were processed with CAD.
IMPRESSION: No mammographic evidence of malignancy.  Suggest yearly screening mammography.

A result letter of this screening mammogram will be mailed directly to the patient.

ASSESSMENT: Negative - BI-RADS 1

Screening mammogram in 1 year.
,

## 2010-09-13 NOTE — Assessment & Plan Note (Signed)
Summary: sur/per pt call/jss  Medications Added ORTHO TRI-CYCLEN (28) 0.18/0.215/0.25 MG-35 MCG TABS (NORGESTIM-ETH ESTRAD TRIPHASIC) 1 tab by mouth once daily ZYRTEC ALLERGY 10 MG CAPS (CETIRIZINE HCL) as needed IBUPROFEN 600 MG TABS (IBUPROFEN) as needed        Primary Provider:  Dr. Weldon Picking  CC:  surgical clearnce knee.  History of Present Illness: Autumn Wyatt is seen at the request of Dr. Leslee Home for surgical clearance because of bilateral knee pain and need for  cartilage repair.   we had seen her years ago for SVT. She continues to carry verapamil n her pocket on an as needed basis.  she has not had any recent SVT.  She denies chest discomfort or exercise intolerance. She's had no edema or syncope.  Cardiac risk factors are broadly negative  Preventive Screening-Counseling & Management  Alcohol-Tobacco     Smoking Status: never  Caffeine-Diet-Exercise     Does Patient Exercise: no  Current Medications (verified): 1)  Ortho Tri-Cyclen (28) 0.18/0.215/0.25 Mg-35 Mcg Tabs (Norgestim-Eth Estrad Triphasic) .Marland Kitchen.. 1 Tab By Mouth Once Daily 2)  Zyrtec Allergy 10 Mg Caps (Cetirizine Hcl) .... As Needed 3)  Ibuprofen 600 Mg Tabs (Ibuprofen) .... As Needed  Past History:  Past Medical History: Current Problems:  SVT Bilateral knee pain Nephrolithiasis  Past Surgical History:  Lasik   Colonoscopy.  Social History: Married  Tobacco Use - No.  Alcohol Use - no Regular Exercise - no Smoking Status:  never Does Patient Exercise:  no  Review of Systems       full review of systems was negative apart from a history of present illness and past medical history.   Vital Signs:  Patient profile:   45 year old female Height:      59 inches Weight:      152 pounds BMI:     30.81 Pulse rate:   78 / minute Resp:     12 per minute BP sitting:   148 / 89  (left arm)  Vitals Entered By: Kem Parkinson (Dec 28, 2009 3:28 PM)  Physical Exam  General:  Alert  and oriented middle-aged Caucasian female appearing her stated age in no acute distress. HEENT  normal . Neck veins were flat; carotids brisk and full without bruits. No lymphadenopathy. Back without kyphosis. Lungs clear. Heart sounds regular without murmurs or gallops. PMI nondisplaced. Abdomen soft with active bowel sounds without midline pulsation or hepatomegaly. Femoral pulses and distal pulses intact. Extremities were without clubbing cyanosis or edemaSkin warm and dry. Neurological exam grossly normal affect was engaging   Impression & Recommendations:  Problem # 1:  PREOPERATIVE EXAMINATION (ICD-V72.84) the patient should be acceptable risk for surgery. she had previously normal left ventricular function and no significant symptoms of heart disease. In the event that she would have recurrent SVT, adenosine would be appropriate therapy.  Dr. Leslee Home is encouraged to call us if he has any concerns around the time of her surgery  Problem # 2:  SUPRAVENTRICULAR TACHYCARDIA (ICD-427.89) as above

## 2010-09-13 NOTE — Letter (Signed)
Summary: [facility]   [facility]   Imported By: Roderic Ovens 02/18/2010 10:30:36  _____________________________________________________________________  External Attachment:    Type:   Image     Comment:   External Document

## 2010-09-13 NOTE — Letter (Signed)
Summary: Clearance Letter  [employer], Main Office  [address]   [place], [place] [address]   Phone: [phone]  Fax: [phone]    Dec 28, 2009  Re:     [facility] Address:   [address]     [place], [place]  [address] DOB:     1965-09-01 MRN:     [id number]   Dear Dr Leslee Home,  Patient is at acceptable cardiac risk for surgery.  Please call with questions.        Sincerely,  Sherryl Manges, MD, [facility] Gypsy Balsam RN BSN

## 2010-10-31 LAB — POCT PREGNANCY, URINE

## 2010-12-27 NOTE — Letter (Signed)
October 31, 2007    Robert L. Foy Guadalajara, M.D.  [address]  [place], [place] [address]   RE:  Autumn Wyatt  MRN:  [id number]  /  DOB:  July 05, 1966   Dear Morton Peters:   It was a pleasure to see Autumn Wyatt again today in followup for her  supraventricular tachycardia.   As you recall, we were having some difficulty trying to get information.  We finally got the EMS records and unfortunately there are no recorded  strips there either. There is part of the narrative that describes the  heart rate of 240 Adenosine and a heart rate of 105. I suspect that the  association is real.   Mr. and Mrs. Lusty and I had a length discussion regarding mechanisms  and treatment options. He also describes recent episodes of palpations  associated with dizziness, which she has a sensation that her heart is  fluttering. These have been intermittent but have been going on for  years and years and are no different from what they have been in the  past.   As you recall, she has not tolerated daily AV nodal blocking agent. She  currently has a prescription for Verapamil 40, that she can take as  needed but she has had no recurrent tachy palpitations.   She has had problems with headaches, which have been worse since she  stopped taking her hormone replacement therapy, the patch removal of  which preceded her last episode of tachycardia by a day or so.   PHYSICAL EXAMINATION:  VITAL SIGNS:  Blood pressure 106/82, pulse 84.  GENERAL:  She was in no acute distress.  LUNGS:  Clear.  HEART:  Sounds regular without murmurs.  ABDOMEN:  Soft.  EXTREMITIES:  Without edema.  SKIN:  Warm and dry.   IMPRESSION:  1. Almost certainly supraventricular tachycardia with a rate of about      240, responsive to Adenosine but without documentation.  2. Palpitations and flutters associated with some dizziness that is      non-positional, likely PVC's or PAC's.   DISCUSSION:  Bo, our conversations with the Bame's  today revolves  around treatment options including:  1. Daily AV nodal blocking therapy.  2. As needed AV nodal blocking therapy plus/minus vagal maneuvers,      which we discussed including and limited to, carotid massage,      described as 5 seconds per side.  3. Valsalva.  4. Ice drinking but no facial eversion.  5. Catheter ablation.   We discussed the potential benefits, as well as potential risks of that  procedure and they would like, after the discussions, to continue going  forward as we are with her p.r.n. Verapamil and vagal maneuvers  available.   As related to her palpitations, we discussed the role of diagnosis using  an event recorder. I think the value of this is for clarity but I do not  think it has much impact, specifically on either risk and as she would  not want to take daily therapy either.   We also reviewed her Myoview scan, which was normal.   Based on the above, she elected to proceed on plan B, which was  available p.r.n., AV nodal blocking agents specifically Verapamil 40,  and vagal maneuvers. We will see her again at their request, in the  event that she has clinical recurrent, which is almost certain.   Bo, thanks very much for asking [place] to participate in  her care.    Sincerely,     Duke Salvia, MD, [facility]  Electronically Signed   SCK/MedQ  DD: 10/31/2007  DT: 10/31/2007  Job #: [id number]

## 2010-12-27 NOTE — Letter (Signed)
September 04, 2007    Robert L. Foy Guadalajara, M.D.  [address]  [place], [place] [address]   RE:  Autumn, Wyatt  MRN:  [id number]  /  DOB:  1965/12/10   Dear. Bo:   It was a pleasure to see Autumn Wyatt at your request today.  She has  documented tachycardia, which you have seen but I have not.   The first episode occurred in November of this year where she had the  abrupt onset of tachy palpitations associated with diaphoresis,  shortness of breath and lightheadedness as well as chest discomfort.  EMS was called.  They recorded a heart rate of 240-260.  She was given  adenosine with abrupt termination.   She then had another episode a week or two ago which was much less  problematic.  Trying to track through the ED  records is very difficult.  Nursing triage notes referred to a heart rate of 240, the first recorded  heart rate is about 120, the electrocardiogram is also 120.  There is a  description of adenosine in the time sequence that would suggest it was  given in the context of her sinus tachycardia.   These episodes are FROG negative but diuretic positive.   These both occurred in the context of her menses.  She does not use  significant caffeine.   She was started on Cardizem after the last episode and over the last 2-3  weeks, she has had progressive dyspnea on exertion.  There has been no  accompanying peripheral edema.   PAST MEDICAL HISTORY:  Notable for allergies and anemia.   She has had no prior surgery and her review of systems is otherwise  broadly negative as noted on the intake sheet.   CURRENT MEDICATIONS:  Climara, which is an estrogen replacement therapy,  and diltiazem.   ALLERGIES:  She is allergic to CODEINE, DEMEROL and VERSED, having  become severely nausea during a colonoscopy having received DEMEROL and  VERSED as a combination.   SOCIAL HISTORY:  She is married.  She has two children.  She works at  home.  She does not use cigarettes or  recreational drugs.  She does use  alcohol occasionally.   PHYSICAL EXAMINATION:  GENERAL APPEARANCE:  She is a [place] Caucasian  female appearing her stated age of 4.  VITAL SIGNS:  Her blood pressure was 122/84.  Her pulse is 74.  She is  133 pounds and she is 4 feet 11.  HEENT:  Exam demonstrated no icterus or xanthoma.  The neck veins were  flat.  The carotids were brisk and full bilaterally out bruits.  BACK:  Without kyphosis or scoliosis.  LUNGS:  Clear.  HEART:  The heart sounds were regular without murmurs or gallops.  ABDOMEN:  Soft with active bowel sounds without midline pulsation or  hepatomegaly.  VASCULAR:  Femoral pulses were 2+, distal pulses were intact.  EXTREMITIES:  There was no clubbing, cyanosis or edema.  Neurologic:  Exam was grossly normal.  SKIN:  Warm and dry.   Electrocardiogram dated August 19, 2007, at 2300, demonstrated sinus  rhythm at 95 with intervals of 0.12/0.07/0.32, there was no evidence of  ventricular pre-excitation.  There was mild ST-segment changes.  The  electrocardiogram from 2200 hours demonstrated similar P-wave  morphology, similar intervals but now ST-segment depression in the  inferolateral leads.   IMPRESSION:  1. Recurrent supraventricular tachycardia documented by EMS and  apparently seen by you, but which we have no records.  2. Dyspnea on exertion associated with diltiazem therapy.  3. Significant ST-segment depression associated with her sinus      tachycardia.   Bo, I am not quite sure what the mechanism of her arrhythmia is.  She  recalls having EMS tell her that it looked like sinus tachycardia.  This  would be exceedingly unlikely at a rate of 240-260 beats per minute.  Further, I think at the adenosine responsiveness would suggest that this  was AV nodal in origin.  To clarify that, I am going to need to look at  the strips and I have put a call into EMS to see if we cannot get that  information.  If not, I  will contact you directly.   As relates to her Cardizem, as she was not tolerating it from a dyspnea  point of view, I have discontinued it, put her on verapamil 40 to take  as needed.   Because of her ST-segment changes.  We will plan to undertake a Myoview  scan when she returns from her  vacation to [place].   We discussed a variety of treatment options for her tachycardia  including catheter ablation with potential risk of death and heart block  requiring pacemaker implantation, vascular injury, etc.  They would like  not to pursue that at present.  We discussed the use of verapamil on a  p.r.n. basis as an alternative to the past which is associated with  dyspnea and they were agreeable to that.   We will plan to see her again in 8-10 weeks' time, following her return  from [place] and she is advised to call us if she has a problems in the  interim.    Sincerely,      Duke Salvia, MD, [facility]  Electronically Signed    SCK/MedQ  DD: 09/04/2007  DT: 09/05/2007  Job #: [id number]

## 2010-12-30 NOTE — Op Note (Signed)
NAME:  Autumn Wyatt, Autumn Wyatt                        ACCOUNT NO.:  [id number]   MEDICAL RECORD NO.:  [id number]                   PATIENT TYPE:  AMB   LOCATION:  ENDO                                 FACILITY:  [facility]   PHYSICIAN:  Anselmo Rod, M.D.               DATE OF BIRTH:  Nov 02, 1965   DATE OF PROCEDURE:  10/01/2002  DATE OF DISCHARGE:                                 OPERATIVE REPORT   PROCEDURE:  Colonoscopy.   ENDOSCOPIST:  Anselmo Rod, M.D.   INSTRUMENT USED:  Pediatric adjustable Olympus colonoscope.   INDICATIONS FOR PROCEDURE:  This 45 year old white female with a history of  urgency in bowel movements, a history of colon cancer, in a maternal aunt  and Crohn's in both parents.  Rule out IBD, masses, polyps, etc.   PRE-PROCEDURE PREPARATION:  An informed consent was procured from the  patient.  The patient fasted for eight hours prior to the procedure.  The  patient was then prepped with a bottle of  MiraLax and Gatorade on the night  prior to the procedure.   PRE-PROCEDURE PHYSICAL EXAMINATION:  VITAL SIGNS:  Stable.  NECK:  Supple.  CHEST:  Clear to auscultation.  HEART:  S1, S2.  ABDOMEN:  Soft, with normal bowel sounds.   DESCRIPTION OF PROCEDURE:  The patient was placed in the left lateral  decubitus position and sedated with 100 mg of Demerol and 10 mg of Versed  intravenously.  Once the patient was adequately sedated and maintained on  low-flow oxygen and continuous cardiac monitoring, the pediatric adjustable  Olympus colonoscope was advanced into the rectum to the cecum and the  terminal ileum without difficulty.  The appendicular orifice and ileocecal  valve were clearly visualized and photographed.  No masses, polyps,  erosions, or ulcerations were seen.  There was no evidence of  diverticulosis.  The patient tolerated the procedure well without  complications.   IMPRESSION:  Normal colonoscopy to the terminal ileum.   RECOMMENDATIONS:  1. I  suspect that the patient has IBS.  A high fiber diet has been discussed     with her in great detail, and brochures     have been given to her for education.  Fiber 15 to 20 gm in the diet has     been recommended along with liberal fluid intake.  2. Outpatient followup in the next two weeks for further recommendations.                                               Anselmo Rod, M.D.    JNM/MEDQ  D:  10/01/2002  T:  10/01/2002  Job:  [id number]   cc:   Dois Davenport A. Rivard, M.D.  [address]  [place]  [place] [address]  Fax: [phone]

## 2010-12-30 NOTE — H&P (Signed)
[facility]  Patient:    Autumn Wyatt, Autumn Wyatt Visit Number: [id number] MRN: [id number]          Service Type: OBS Location: 910B 9158 01 Attending Physician:  Lenoard Aden Dictated by:   Lenoard Aden, M.D. Admit Date:  09/18/2001   CC:         [facility]   History and Physical  CHIEF COMPLAINT:              Spontaneous rupture of membranes at 1:30 a.m.  HISTORY OF PRESENT ILLNESS:   The patient is a 45 year old white female G37, P63, ED of October 09, 2001 at 37 weeks who presents with spontaneous rupture of membranes at 0130.  ALLERGIES:                    CODEINE.  MEDICATIONS:                  Prenatal vitamins.  PAST OBSTETRIC HISTORY:       A 6 pound 4 ounce female in 1997, SAB July 2000, SAB June 2001.  PAST MEDICAL HISTORY:         Remarkable for a history of hematuria and urolithiasis and wisdom tooth removal.  FAMILY HISTORY:               Depression, Crohns disease, and this patient has a personal history of gestational diabetes.  PRENATAL LABORATORY DATA:     Blood type 0+, Rh antibody negative, rubella immune, hepatitis B surface antigen negative, HIV nonreactive, and GBS performed was negative.  PREGNANCY HISTORY:            Pregnancy course complicated by preterm cervical change, given steroids in December 2002, and has now progressed to term. History of gestational diabetes, stable on dietary control.  PHYSICAL EXAMINATION:  GENERAL:                      A well-developed, well-nourished white female in no apparent distress.  HEENT:                        Normal.  LUNGS:                        Clear.  HEART:                        Regular rate and rhythm.  ABDOMEN:                      Soft, gravid, nontender.  Estimated fetal weight 6-1/2 to 7 pounds.  The cervix is 1, 50, -3, per nurse.  EXTREMITIES:                  No cords.  NEUROLOGIC:                   Nonfocal.  IMPRESSION:                    1. A 37-week intrauterine pregnancy.                               2. Spontaneous rupture of membranes.  3. Advanced maternal age.  The patient declined                                  amniocentesis.                               4. History of precipitous labor.                               5. Status post betamethasone.  PLAN:                         Pitocin augmentation.  Anticipate attempts at vaginal delivery.  Watch blood glucoses closely. Dictated by:   Lenoard Aden, M.D. Attending Physician:  Lenoard Aden DD:  09/18/01 TD:  09/18/01 Job: [id number]

## 2011-05-03 ENCOUNTER — Other Ambulatory Visit: Payer: Self-pay | Admitting: Obstetrics and Gynecology

## 2011-05-03 DIAGNOSIS — Z1231 Encounter for screening mammogram for malignant neoplasm of breast: Secondary | ICD-10-CM

## 2011-05-04 LAB — I-STAT 8, (EC8 V) (CONVERTED LAB)
Acid-base deficit: 3 — ABNORMAL HIGH
Bicarbonate: 23.2
Chloride: 110
pCO2, Ven: 42.6 — ABNORMAL LOW
pH, Ven: 7.343 — ABNORMAL HIGH

## 2011-05-04 LAB — DIFFERENTIAL
Basophils Absolute: 0.1
Basophils Relative: 1
Eosinophils Absolute: 0.1
Monocytes Relative: 7
Neutro Abs: 6.1
Neutrophils Relative %: 67

## 2011-05-04 LAB — POCT CARDIAC MARKERS
Myoglobin, poc: 49.1
Operator id: 257131
Troponin i, poc: <0.05

## 2011-05-04 LAB — CBC
MCHC: 34.3
MCV: 91.4
Platelets: 239
RDW: 12.9

## 2011-05-04 LAB — POCT I-STAT CREATININE: Creatinine, Ser: 0.7

## 2011-05-16 ENCOUNTER — Ambulatory Visit
Admission: RE | Admit: 2011-05-16 | Discharge: 2011-05-16 | Disposition: A | Discharge status: Home / Self Care | Payer: Self-pay | Source: Ambulatory Visit | Attending: Obstetrics and Gynecology | Admitting: Obstetrics and Gynecology

## 2011-05-16 DIAGNOSIS — Z1231 Encounter for screening mammogram for malignant neoplasm of breast: Secondary | ICD-10-CM

## 2011-10-25 ENCOUNTER — Other Ambulatory Visit (HOSPITAL_BASED_OUTPATIENT_CLINIC_OR_DEPARTMENT_OTHER): Payer: Self-pay | Admitting: Family Medicine

## 2011-10-25 DIAGNOSIS — R220 Localized swelling, mass and lump, head: Secondary | ICD-10-CM

## 2011-10-26 ENCOUNTER — Ambulatory Visit (HOSPITAL_BASED_OUTPATIENT_CLINIC_OR_DEPARTMENT_OTHER)
Admission: RE | Admit: 2011-10-26 | Discharge: 2011-10-26 | Disposition: A | Discharge status: Home / Self Care | Payer: BC Managed Care – PPO | Source: Ambulatory Visit | Attending: Family Medicine | Admitting: Family Medicine

## 2011-10-26 DIAGNOSIS — R599 Enlarged lymph nodes, unspecified: Secondary | ICD-10-CM

## 2011-10-26 DIAGNOSIS — R221 Localized swelling, mass and lump, neck: Secondary | ICD-10-CM

## 2011-10-26 DIAGNOSIS — R220 Localized swelling, mass and lump, head: Secondary | ICD-10-CM | POA: Insufficient documentation

## 2011-10-26 IMAGING — CT CT NECK WO/W CM
3 of 7 series · 12 of 33 positions shown, 14 images · IV contrast (omnipaque)
Comparison: None.

CLINICAL DATA: Swelling, mass, or lump in head and neck.  784.2.

CT NECK WITHOUT AND WITH CONTRAST
TECHNIQUE: Multidetector CT imaging of the neck was performed
without and with intravenous contrast.
Contrast: 80mL OMNIPAQUE IOHEXOL 300 MG/ML IJ SOLN

[Series 4: neck 2.0 coronal · coronal · 0.30mm/px · 1 of 87 slices shown]
[im 44/87  bone]
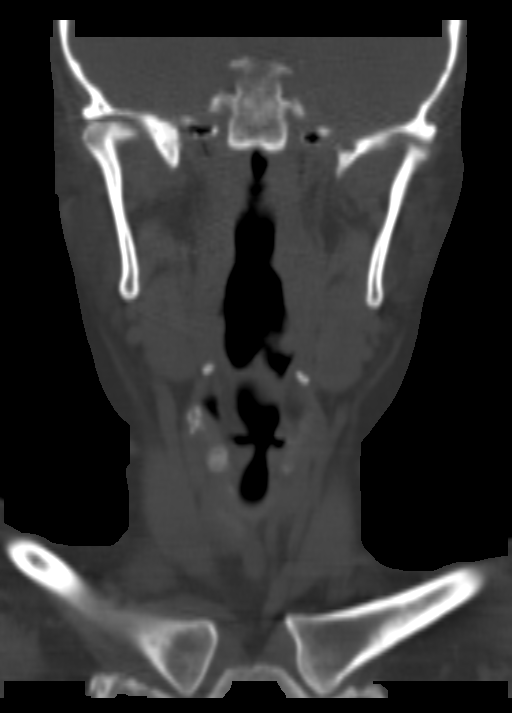

[Series 11: neck 2.0 sagittal · sagittal · 0.39mm/px · 5 of 69 slices shown]
[im 12/69  bone]
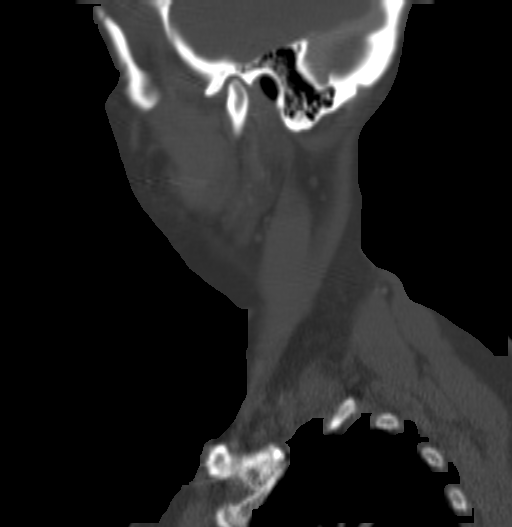
[im 23/69  bone]
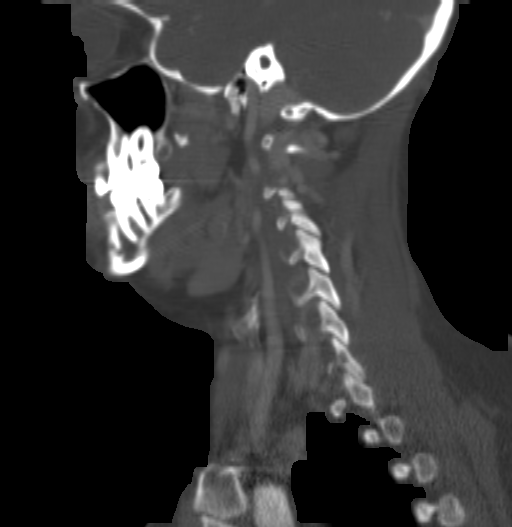
[im 35/69  bone]
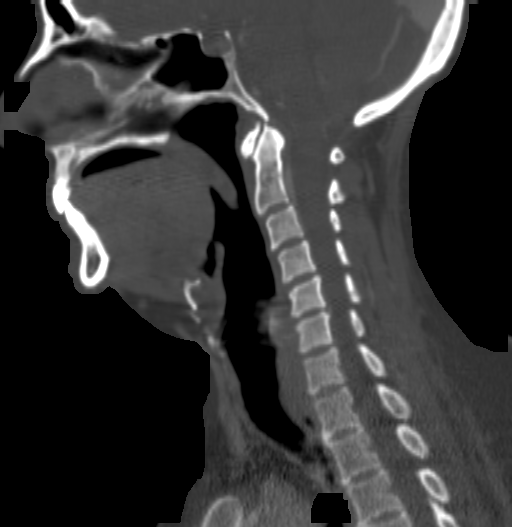
[im 46/69  bone]
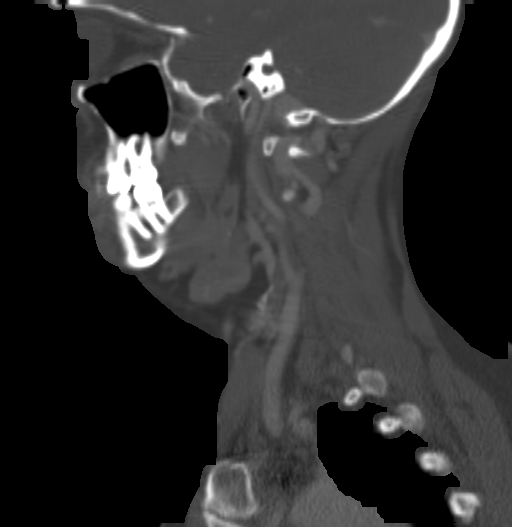
[im 57/69  bone]
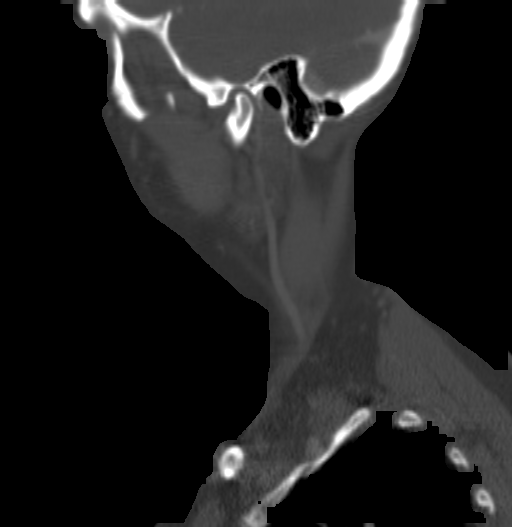

[Series 12: neck 1.5 thins · axial · 0.38mm/px · z∈[+646,+793]mm · 6 of 294 slices shown, 8 images]
[im 42/294  soft-tissue]
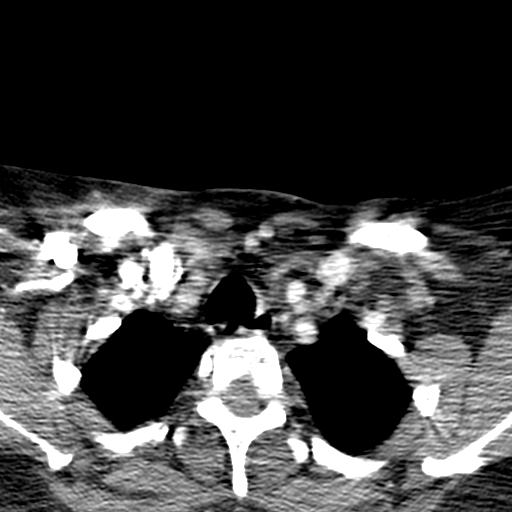
[im 42/294  bone]
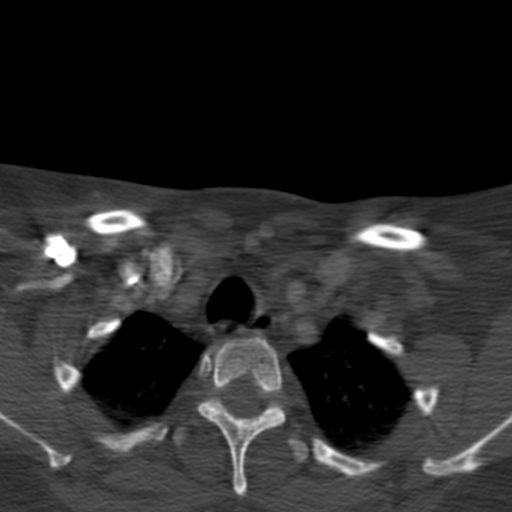
[im 84/294  bone]
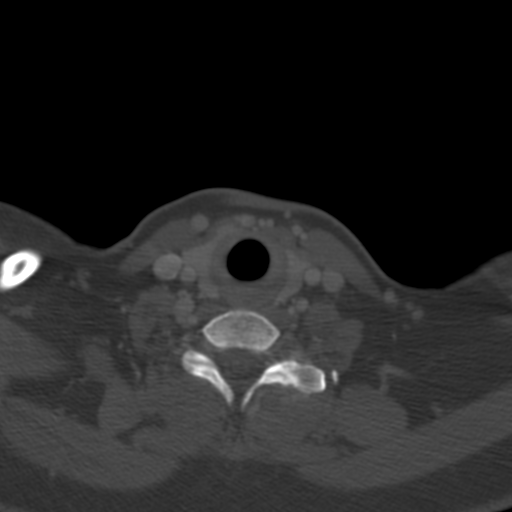
[im 126/294  bone]
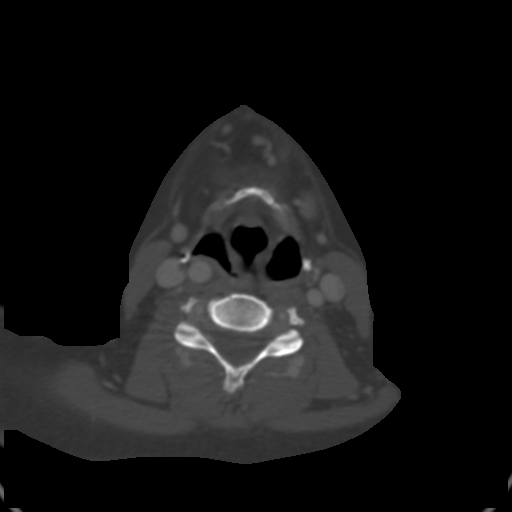
[im 168/294  bone]
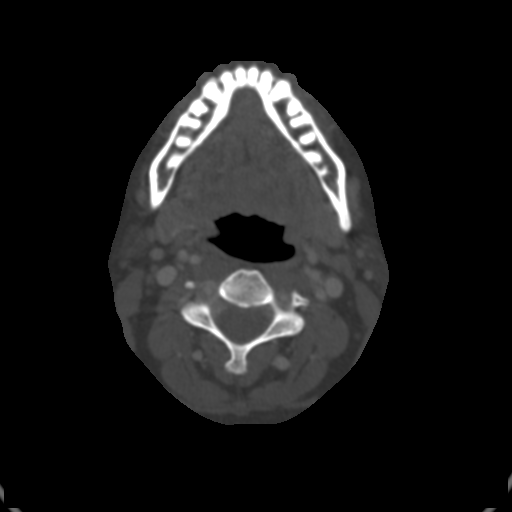
[im 210/294  soft-tissue]
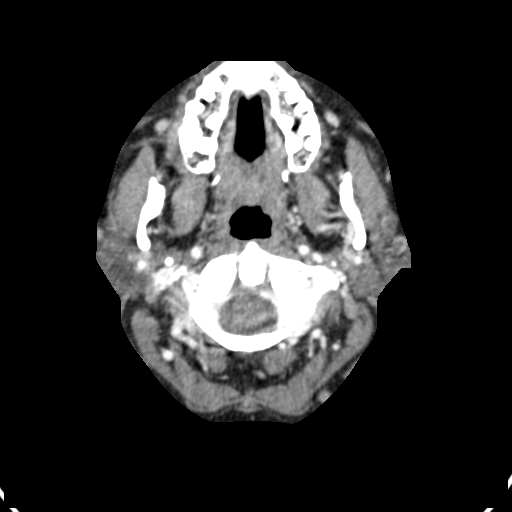
[im 210/294  bone]
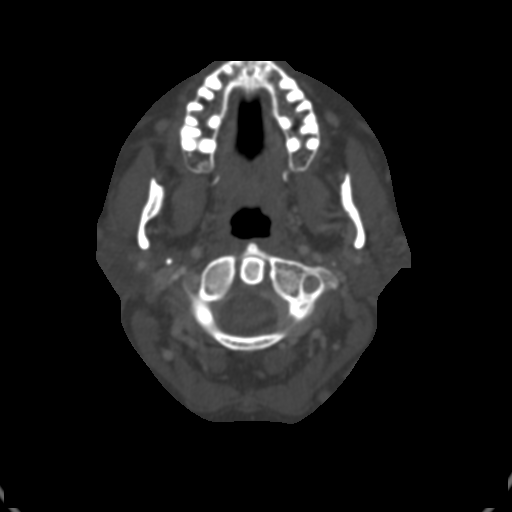
[im 252/294  bone]
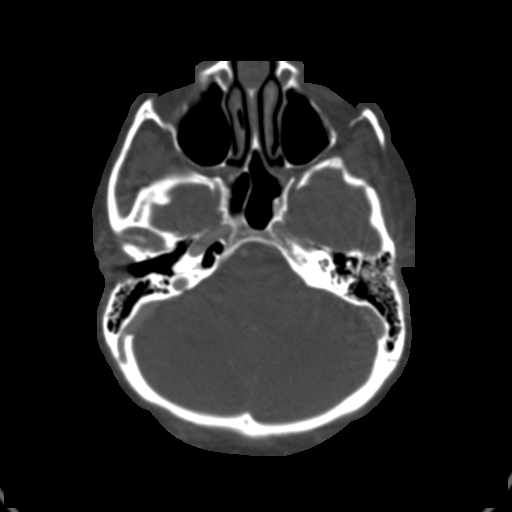

[12 of 33 positions shown; findings below may reference images not displayed]

FINDINGS: A small intraparotid lymph node lies subjacent to the
area marked over the right parotid gland.  No parotid mass is
evident.  There is a similar small intraparotid lymph node on the
left.  The submandibular glands are within normal limits
bilaterally.  There are small reactive sized submandibular and
submental lymph nodes bilaterally.  Small jugular chain lymph nodes
are present bilaterally as well, slightly more prominent on the
left.  These are likely reactive.  No pathologically enlarged nodes
are evident.

No focal mucosal or submucosal lesions are present.  The vocal
cords are midline and symmetric.  The thyroid is within normal
limits. The soft tissues of the neck are otherwise unremarkable.

The lung apices are clear.

The vertebral body heights and alignment are maintained.  There is
some straightening of the normal cervical lordosis.
IMPRESSION: 1. Small focal T1 hyperintense lesions at the superior aspect of
the parotid glands bilaterally are compatible with intraparotid
lymph nodes.  This is the only lesion subjacent to the area marked
in the right preauricular area.
2.  Small reactive sized lymph nodes bilaterally.  No
pathologically enlarged nodes are evident.

## 2011-10-26 MED ORDER — IOHEXOL 300 MG/ML  SOLN
80.0000 mL | Freq: Once | INTRAMUSCULAR | Status: AC | PRN
  Administered 2011-10-26: 80 mL via INTRAVENOUS

## 2011-10-27 ENCOUNTER — Other Ambulatory Visit (HOSPITAL_BASED_OUTPATIENT_CLINIC_OR_DEPARTMENT_OTHER): Payer: BC Managed Care – PPO

## 2011-11-02 ENCOUNTER — Encounter (INDEPENDENT_AMBULATORY_CARE_PROVIDER_SITE_OTHER): Payer: BC Managed Care – PPO | Admitting: Obstetrics and Gynecology

## 2011-11-02 DIAGNOSIS — R87610 Atypical squamous cells of undetermined significance on cytologic smear of cervix (ASC-US): Secondary | ICD-10-CM

## 2012-04-11 ENCOUNTER — Other Ambulatory Visit: Payer: Self-pay | Admitting: Obstetrics and Gynecology

## 2012-04-11 DIAGNOSIS — Z1231 Encounter for screening mammogram for malignant neoplasm of breast: Secondary | ICD-10-CM

## 2012-05-06 ENCOUNTER — Telehealth: Payer: Self-pay | Admitting: Obstetrics and Gynecology

## 2012-05-07 ENCOUNTER — Other Ambulatory Visit: Payer: Self-pay

## 2012-05-07 MED ORDER — NORGESTIM-ETH ESTRAD TRIPHASIC 0.18/0.215/0.25 MG-25 MCG PO TABS: 1.0000 | ORAL_TABLET | Freq: Every day | ORAL | Status: DC

## 2012-05-07 NOTE — Telephone Encounter (Signed)
Pt has aex appt scheduled with Dr. Estanislado Pandy on 06/05/12 but needs one more pack of bc pills. Pt last aex was 06/09/11. Refill sent to [facility], pt notified and voiced understanding.

## 2012-05-21 ENCOUNTER — Ambulatory Visit (INDEPENDENT_AMBULATORY_CARE_PROVIDER_SITE_OTHER): Payer: BC Managed Care – PPO

## 2012-05-21 DIAGNOSIS — Z1231 Encounter for screening mammogram for malignant neoplasm of breast: Secondary | ICD-10-CM

## 2012-05-22 IMAGING — MG MM DIGITAL SCREENING BILAT
4 series · 4 of 4 positions shown · non-contrast
Comparison: Previous exams.

CLINICAL DATA: Screening.

DIGITAL BILATERAL SCREENING MAMMOGRAM WITH CAD

[R CC]
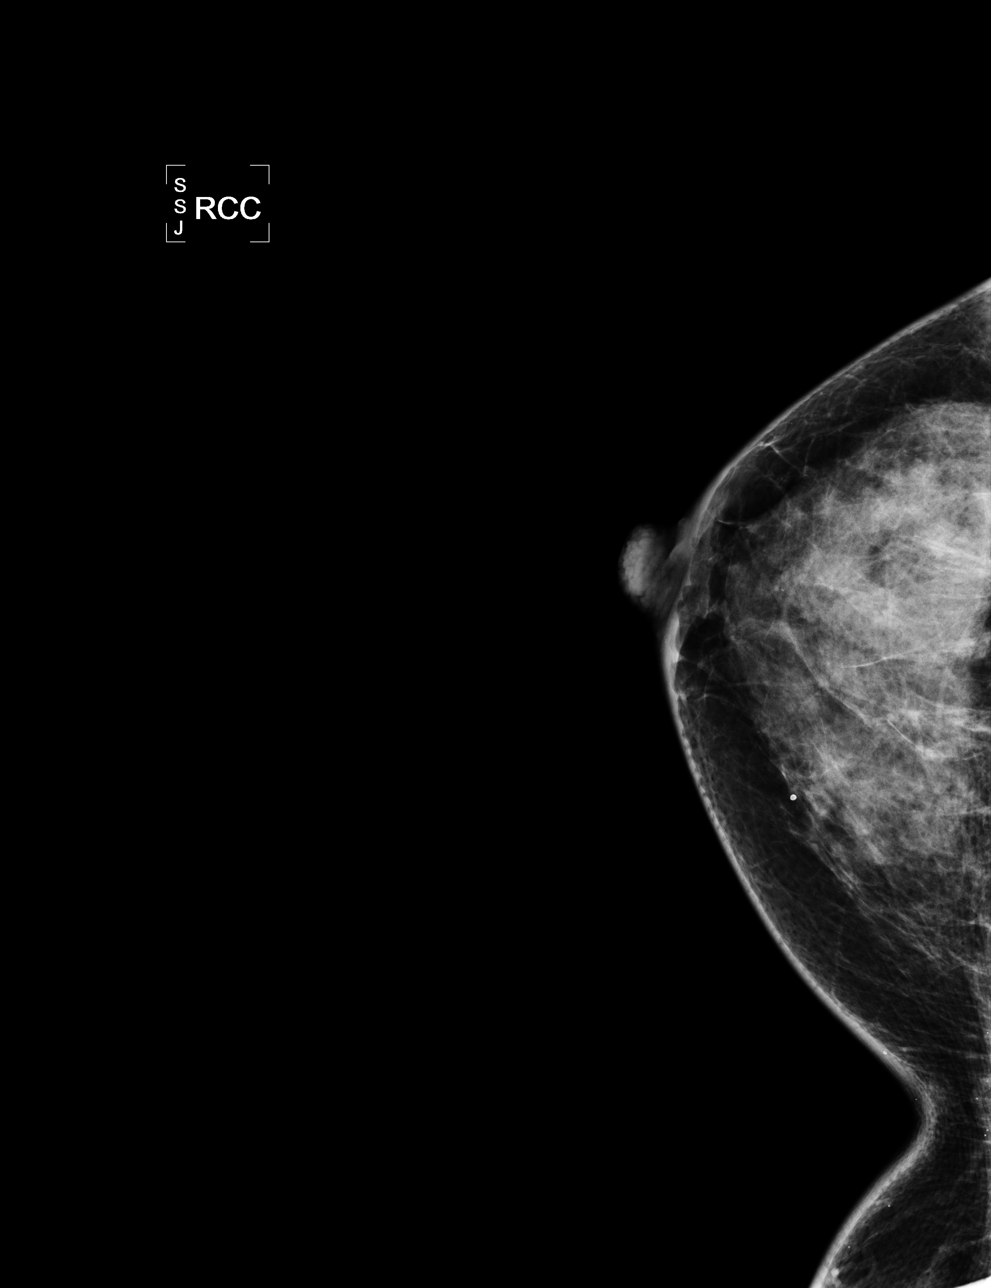

[L CC]
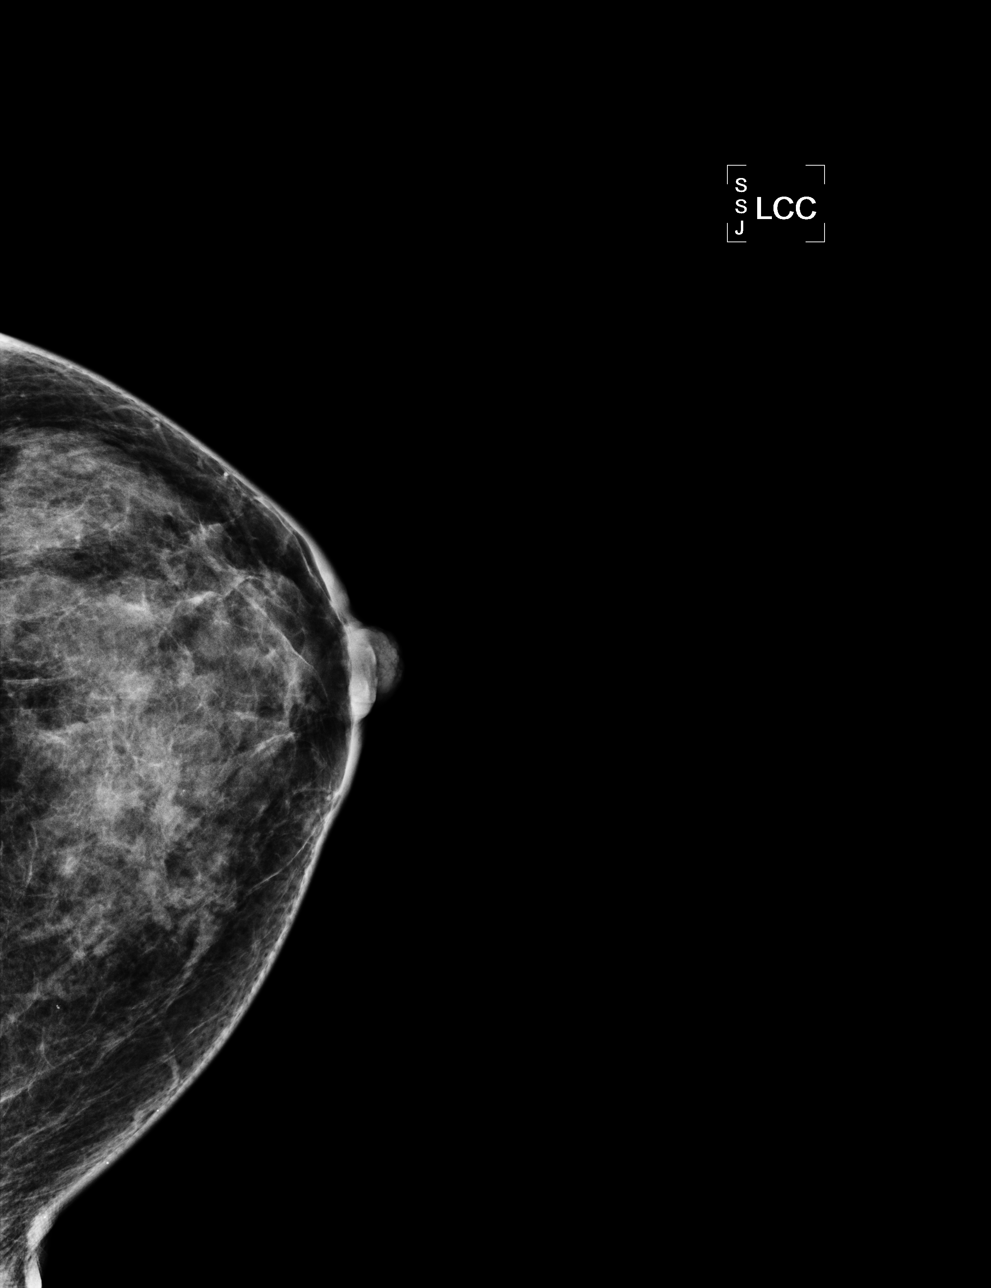

[L MLO]
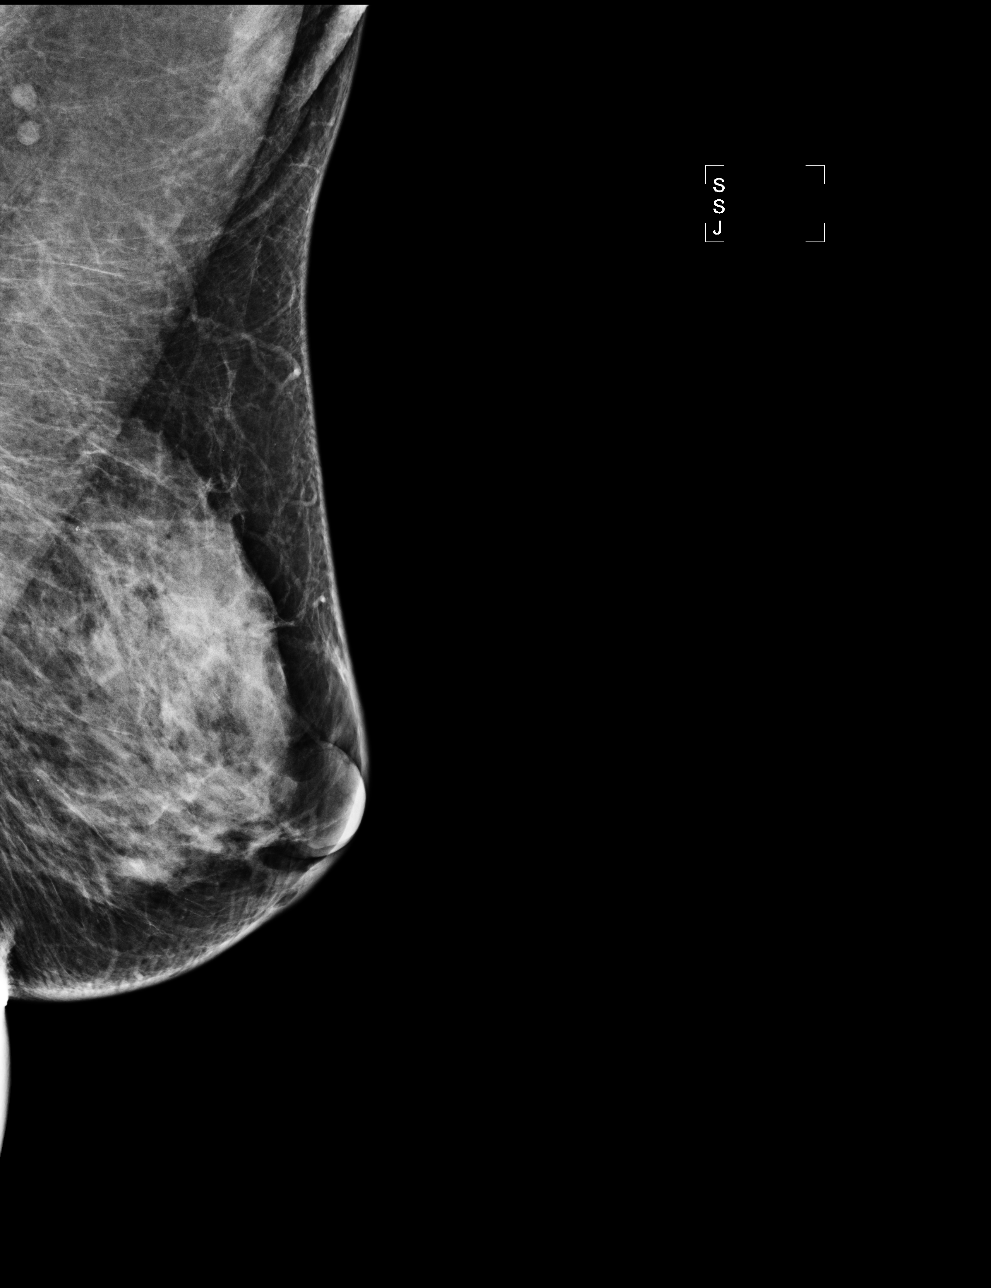

[R MLO]
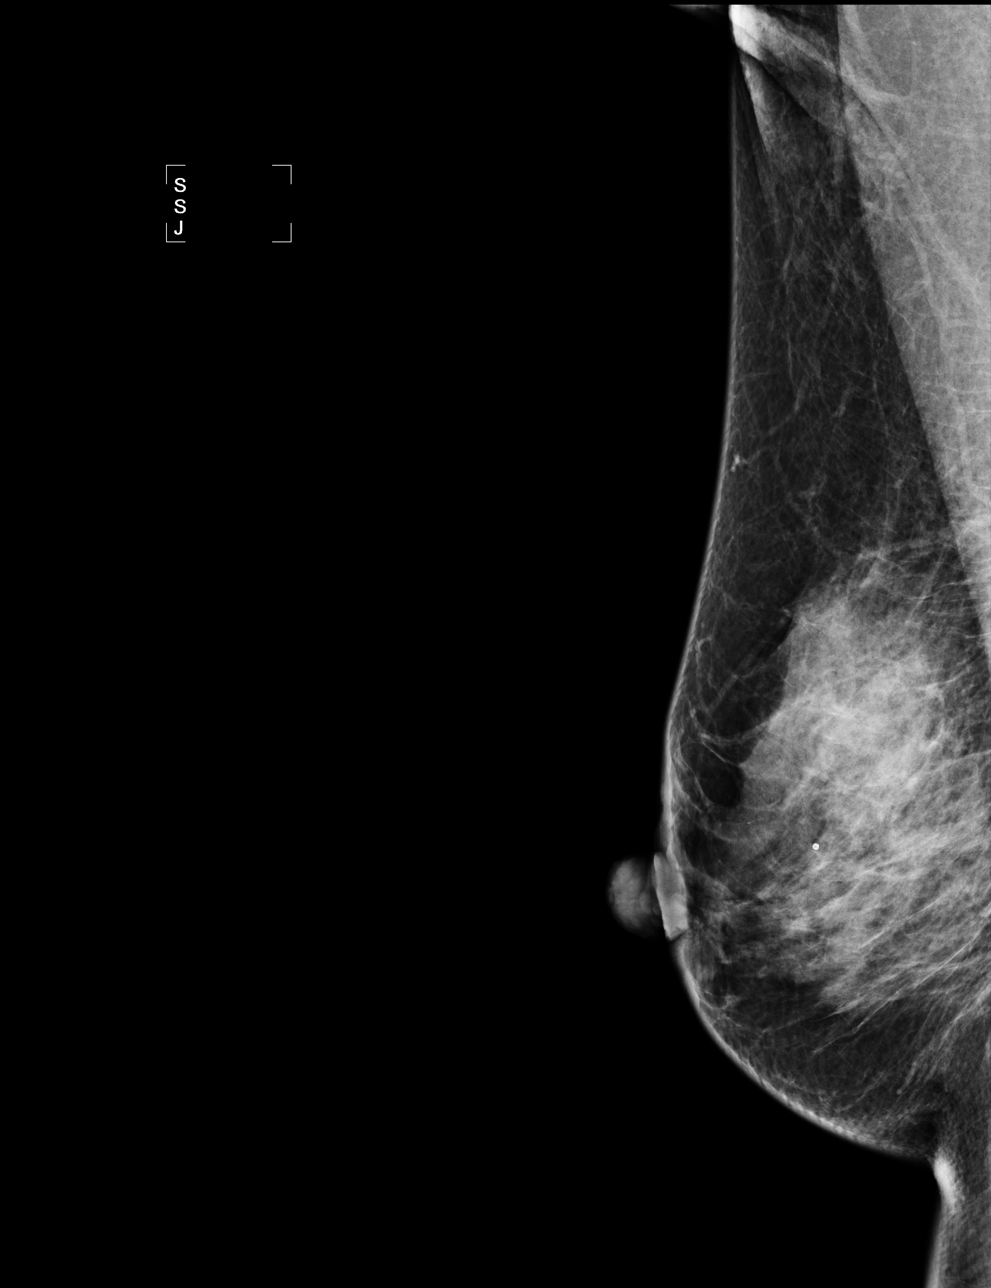

[4 of 4 positions shown; findings below may reference images not displayed]

FINDINGS: The breast tissue is heterogeneously dense. No
suspicious masses, architectural distortion, or calcifications are
present.

Images were processed with CAD.
IMPRESSION: No mammographic evidence of malignancy.

A result letter of this screening mammogram will be mailed directly
to the patient.

RECOMMENDATION:
Screening mammogram in one year. (Code:[F7])

BI-RADS CATEGORY 1:  Negative.

## 2012-05-28 ENCOUNTER — Encounter: Payer: Self-pay | Admitting: Obstetrics and Gynecology

## 2012-05-28 NOTE — Progress Notes (Signed)
Quick Note:  Please send "Dense breast" letter to patient and document in chart when letter is sent. ______ 

## 2012-06-05 ENCOUNTER — Encounter: Payer: Self-pay | Admitting: Obstetrics and Gynecology

## 2012-06-05 ENCOUNTER — Ambulatory Visit (INDEPENDENT_AMBULATORY_CARE_PROVIDER_SITE_OTHER): Payer: BC Managed Care – PPO | Admitting: Obstetrics and Gynecology

## 2012-06-05 VITALS — BP 110/76 | Ht 59.0 in | Wt 107.0 lb

## 2012-06-05 DIAGNOSIS — Z01419 Encounter for gynecological examination (general) (routine) without abnormal findings: Secondary | ICD-10-CM

## 2012-06-05 MED ORDER — NORETHINDRONE ACET-ETHINYL EST 1-20 MG-MCG PO TABS: 1.0000 | ORAL_TABLET | Freq: Every day | ORAL | Status: DC

## 2012-06-05 NOTE — Progress Notes (Signed)
The patient reports:normal menses, no abnormal bleeding, pelvic pain or discharge  Contraception:oral contraceptives (estrogen/progesterone)  Last mammogram: was normal October 2013 Last pap: was abnormal: ASCUS hpv -  March  2013  GC/Chlamydia cultures offered: declined HIV/RPR/HbsAg offered:  declined HSV 1 and 2 glycoprotein offered: declined  Menstrual cycle regular and monthly: Yes Menstrual flow normal: No: seems to be heavier   Urinary symptoms: none Normal bowel movements: Yes Reports abuse at home: No  Subjective:    Autumn Wyatt is a 46 y.o. female, No obstetric history on file., who presents for an annual exam.     History   Social History  . Marital Status: Married    Spouse Name: N/A    Number of Children: N/A  . Years of Education: N/A   Social History Main Topics  . Smoking status: Never Smoker   . Smokeless tobacco: Never Used  . Alcohol Use: 0.0 oz/week    5-10 Glasses of wine per week  . Drug Use: No  . Sexually Active: Yes    Birth Control/ Protection: Pill   Other Topics Concern  . None   Social History Narrative  . None    Menstrual cycle:   LMP: Patient's last menstrual period was 05/29/2012.           Cycle: normal  The following portions of the patient's history were reviewed and updated as appropriate: allergies, current medications, past family history, past medical history, past social history, past surgical history and problem list.  Review of Systems Pertinent items are noted in HPI. Breast:Negative for breast lump,nipple discharge or nipple retraction Gastrointestinal: Negative for abdominal pain, change in bowel habits or rectal bleeding Urinary:negative   Objective:    BP 110/76  Ht 4\' 11"  (1.499 m)  Wt 107 lb (48.535 kg)  BMI 21.61 kg/m2  LMP 05/29/2012    Weight:  Wt Readings from Last 1 Encounters:  06/05/12 107 lb (48.535 kg)          BMI: Body mass index is 21.61 kg/(m^2).  General Appearance: Alert,  appropriate appearance for age. No acute distress HEENT: Grossly normal Neck / Thyroid: Supple, no masses, nodes or enlargement Lungs: clear to auscultation bilaterally Back: No CVA tenderness Breast Exam: No masses or nodes.No dimpling, nipple retraction or discharge. Cardiovascular: Regular rate and rhythm. S1, S2, no murmur Gastrointestinal: Soft, non-tender, no masses or organomegaly Pelvic Exam: Vulva and vagina appear normal. Bimanual exam reveals normal uterus and adnexa. Rectovaginal: normal rectal, no masses Lymphatic Exam: Non-palpable nodes in neck, clavicular, axillary, or inguinal regions Skin: no rash or abnormalities Neurologic: Normal gait and speech, no tremor  Psychiatric: Alert and oriented, appropriate affect.     Assessment:    Normal gyn exam    Plan:    mammogram pap smear return annually or prn STD screening: declined Contraception:oral contraceptives (estrogen/progesterone): will change to Loestrin 1/20 to reduce menstrual flow   Silverio Lay MD

## 2012-06-07 LAB — PAP IG W/ RFLX HPV ASCU

## 2012-06-10 ENCOUNTER — Telehealth: Payer: Self-pay | Admitting: Obstetrics and Gynecology

## 2012-06-10 NOTE — Telephone Encounter (Signed)
VM from pt.at 4:23. Dr SR changed dose of OCP.  Received Junel from [employer].  Wants to make sure is correct.  [phone].

## 2012-06-11 NOTE — Telephone Encounter (Signed)
sr pt 

## 2012-06-11 NOTE — Telephone Encounter (Signed)
Pt notified that Loestrin 1/20 is same as Junel.  Pt concerned that it isn't in a 28 day pack.  Offered to send msg to SR for something different but pt said she would try this. Will call if needs different pill.  ld

## 2012-06-12 ENCOUNTER — Ambulatory Visit: Payer: BC Managed Care – PPO | Admitting: Obstetrics and Gynecology

## 2013-04-30 ENCOUNTER — Other Ambulatory Visit: Payer: Self-pay | Admitting: Obstetrics and Gynecology

## 2013-04-30 DIAGNOSIS — Z1231 Encounter for screening mammogram for malignant neoplasm of breast: Secondary | ICD-10-CM

## 2013-05-22 ENCOUNTER — Ambulatory Visit (INDEPENDENT_AMBULATORY_CARE_PROVIDER_SITE_OTHER): Payer: BC Managed Care – PPO

## 2013-05-22 DIAGNOSIS — Z1231 Encounter for screening mammogram for malignant neoplasm of breast: Secondary | ICD-10-CM

## 2013-05-22 IMAGING — MG MM DIGITAL SCREENING
4 series · 4 of 4 positions shown · non-contrast
Comparison: Previous exam(s).

CLINICAL DATA: Screening.

EXAM:
DIGITAL SCREENING BILATERAL MAMMOGRAM WITH CAD

[R CC]
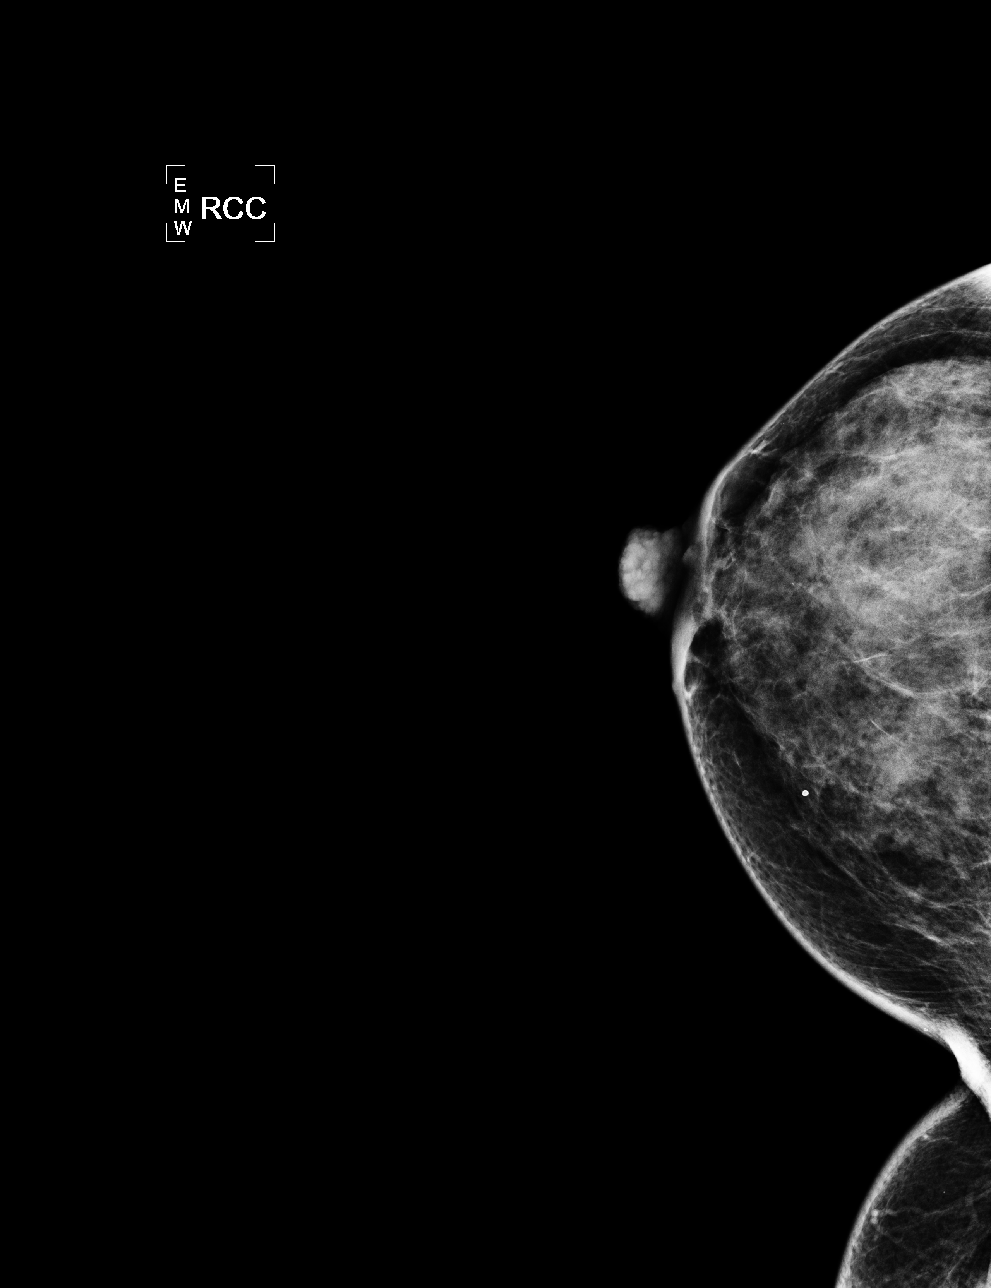

[L CC]
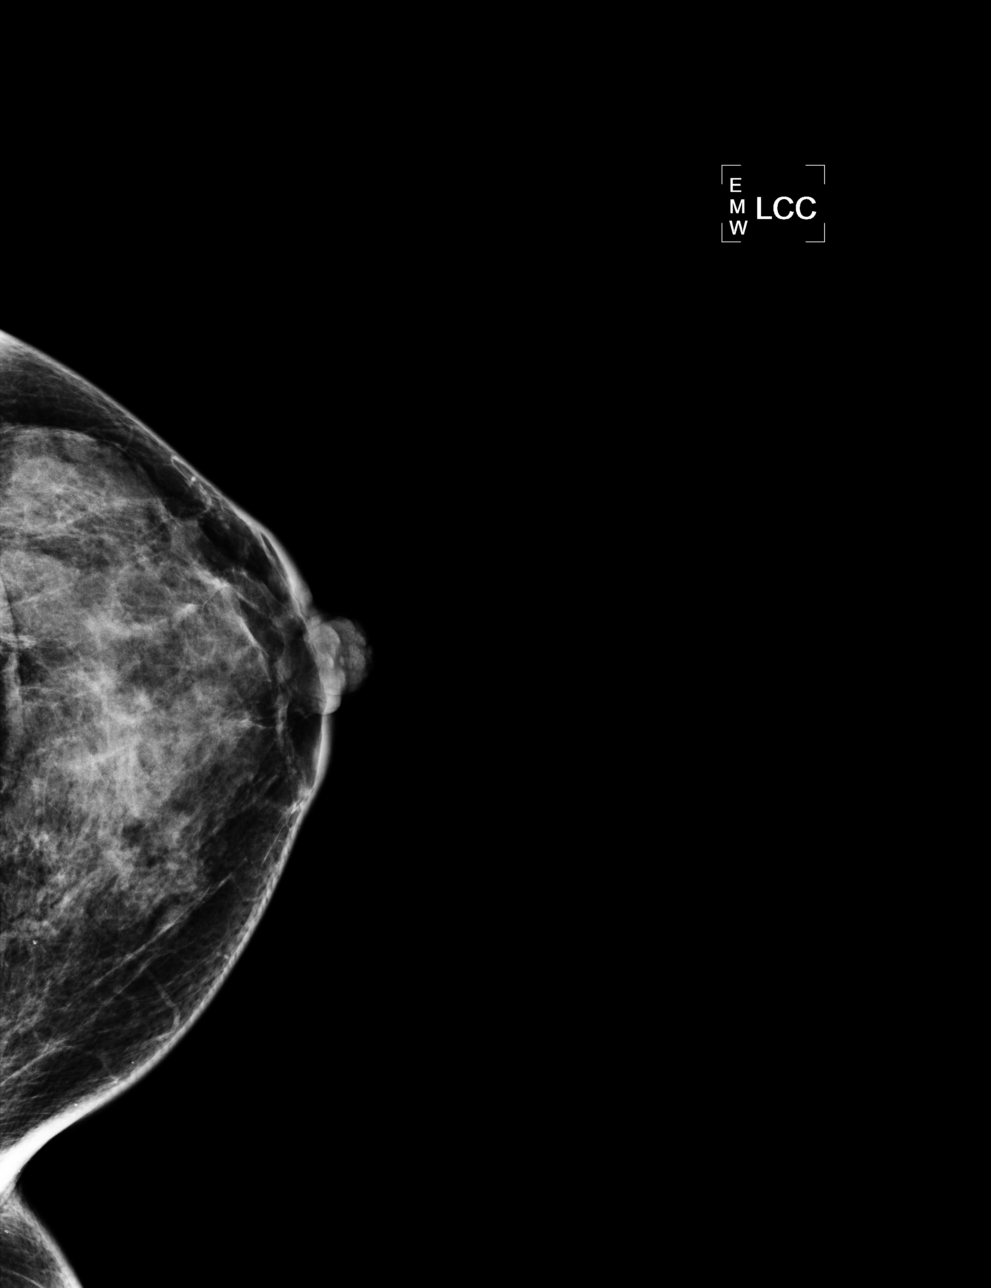

[L MLO]
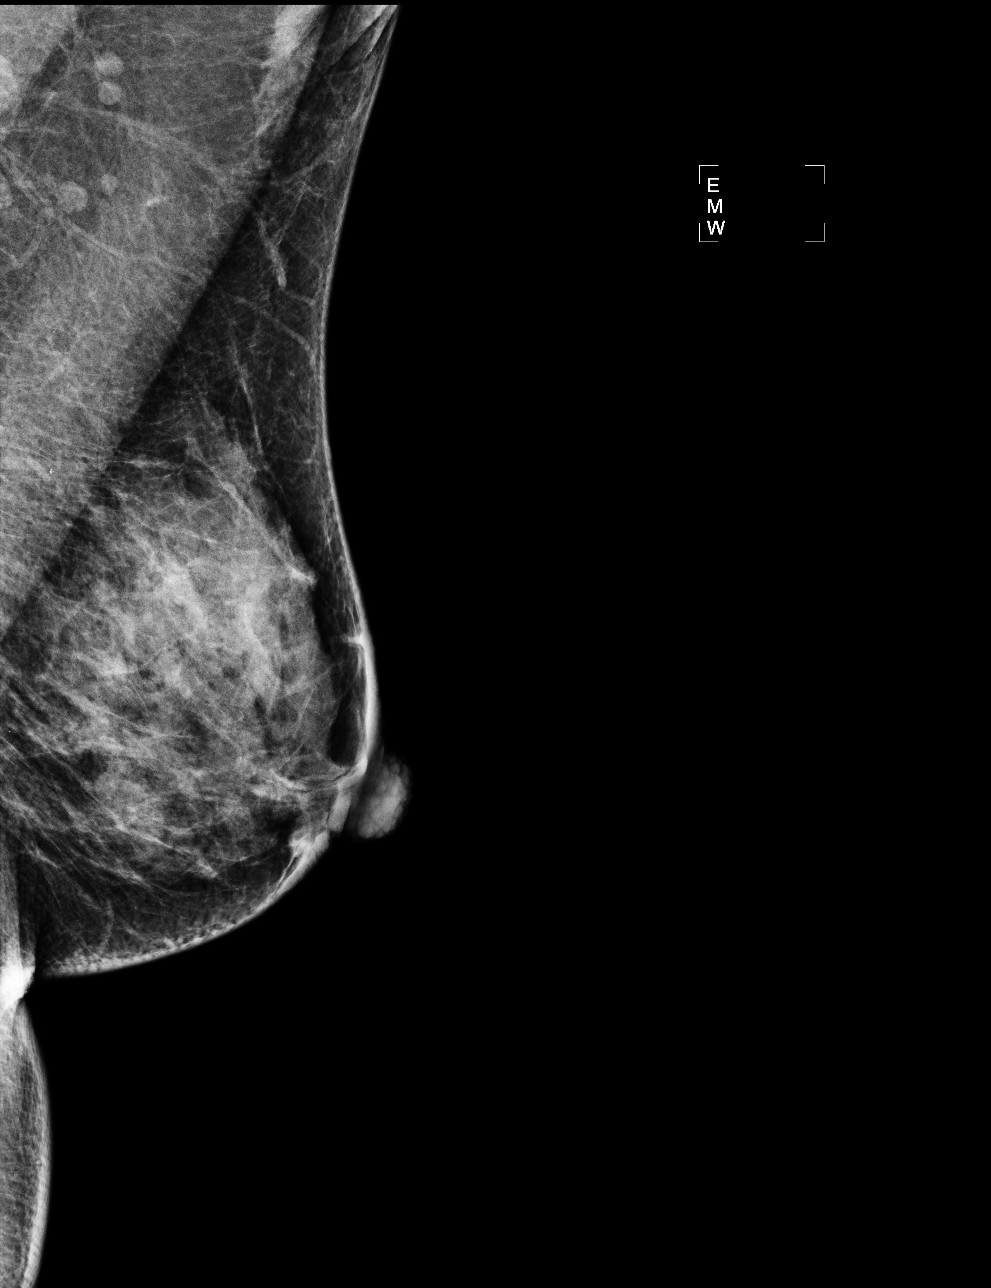

[R MLO]
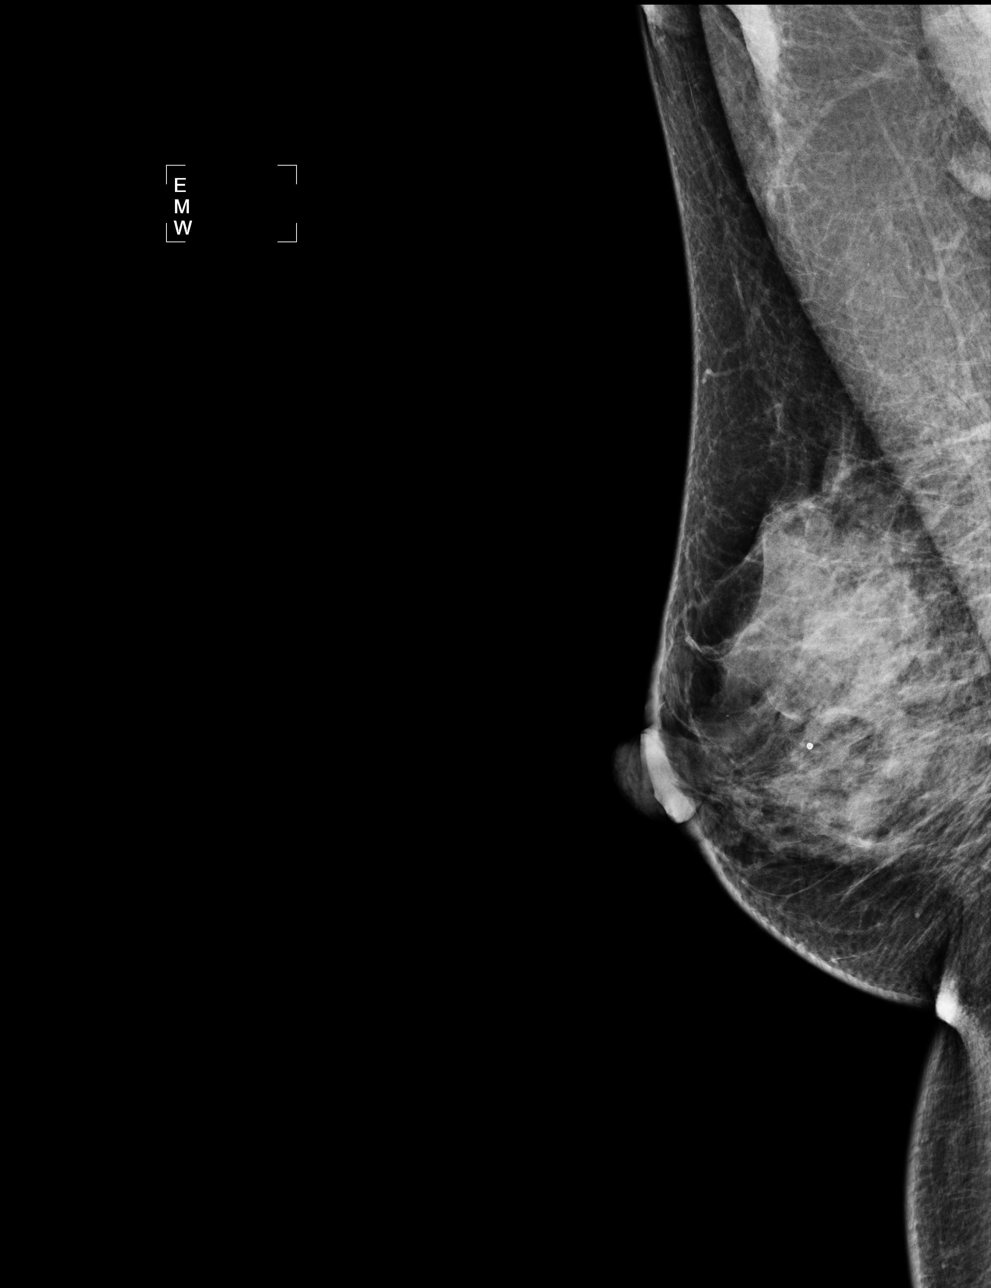

[4 of 4 positions shown; findings below may reference images not displayed]

ACR Breast Density Category d: The breasts are extremely dense,
which lowers the sensitivity of mammography.
FINDINGS: There are no findings suspicious for malignancy. Images were
processed with CAD.
IMPRESSION: No mammographic evidence of malignancy. A result letter of this
screening mammogram will be mailed directly to the patient.

RECOMMENDATION:
Screening mammogram in one year. (Code:[0P])

BI-RADS CATEGORY  1: Negative

## 2014-04-28 ENCOUNTER — Other Ambulatory Visit: Payer: Self-pay | Admitting: Obstetrics and Gynecology

## 2014-04-28 DIAGNOSIS — Z1231 Encounter for screening mammogram for malignant neoplasm of breast: Secondary | ICD-10-CM

## 2014-05-27 ENCOUNTER — Ambulatory Visit (INDEPENDENT_AMBULATORY_CARE_PROVIDER_SITE_OTHER): Payer: BC Managed Care – PPO

## 2014-05-27 DIAGNOSIS — Z1231 Encounter for screening mammogram for malignant neoplasm of breast: Secondary | ICD-10-CM

## 2014-05-27 IMAGING — MG MM DIGITAL SCREENING
4 series · 4 of 4 positions shown · non-contrast
Comparison: Previous exam(s).

CLINICAL DATA: Screening.

EXAM:
DIGITAL SCREENING BILATERAL MAMMOGRAM WITH CAD

[R CC]
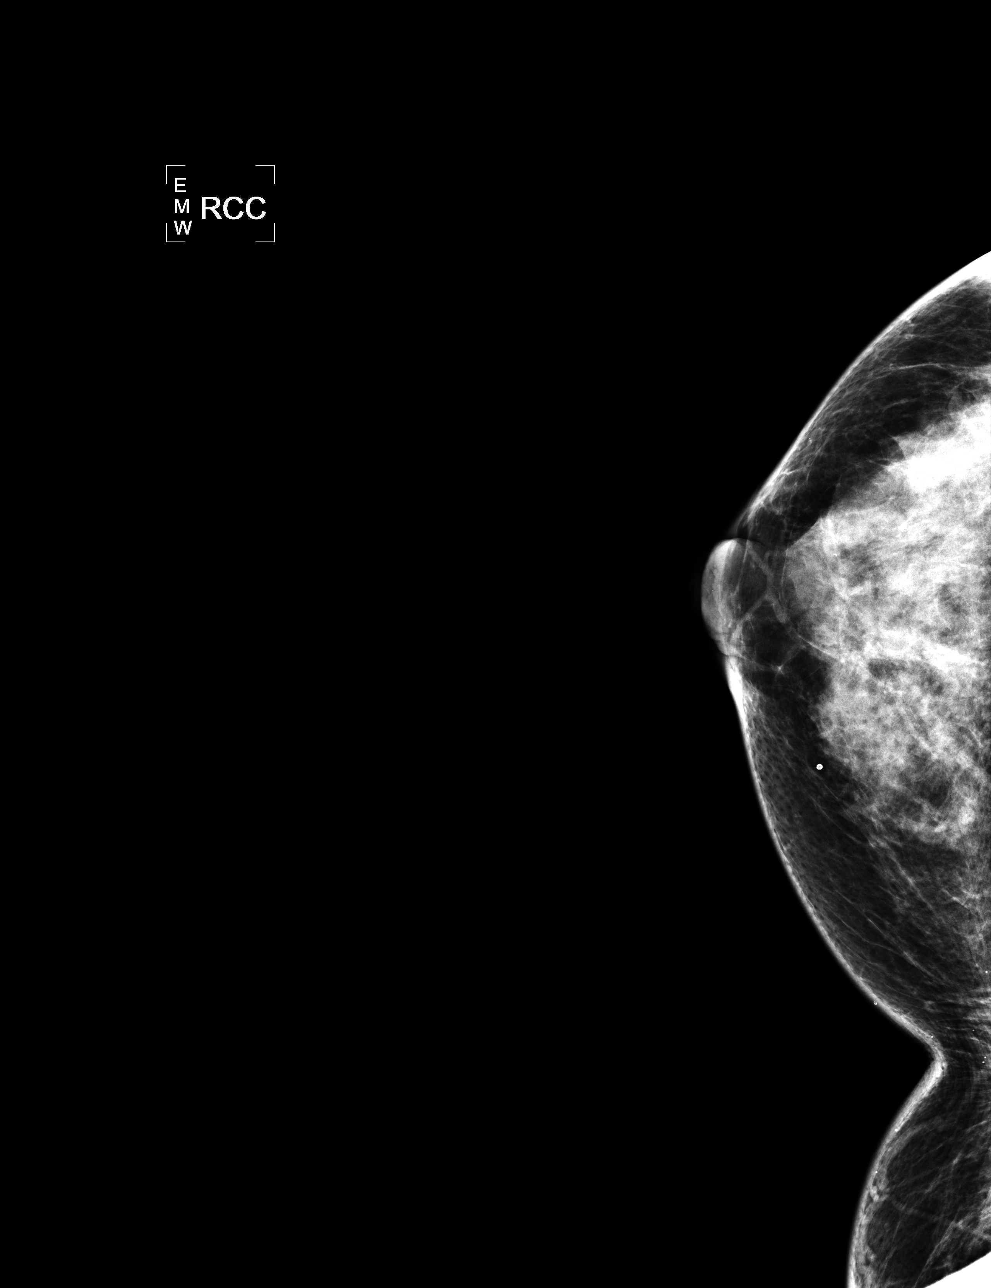

[L CC]
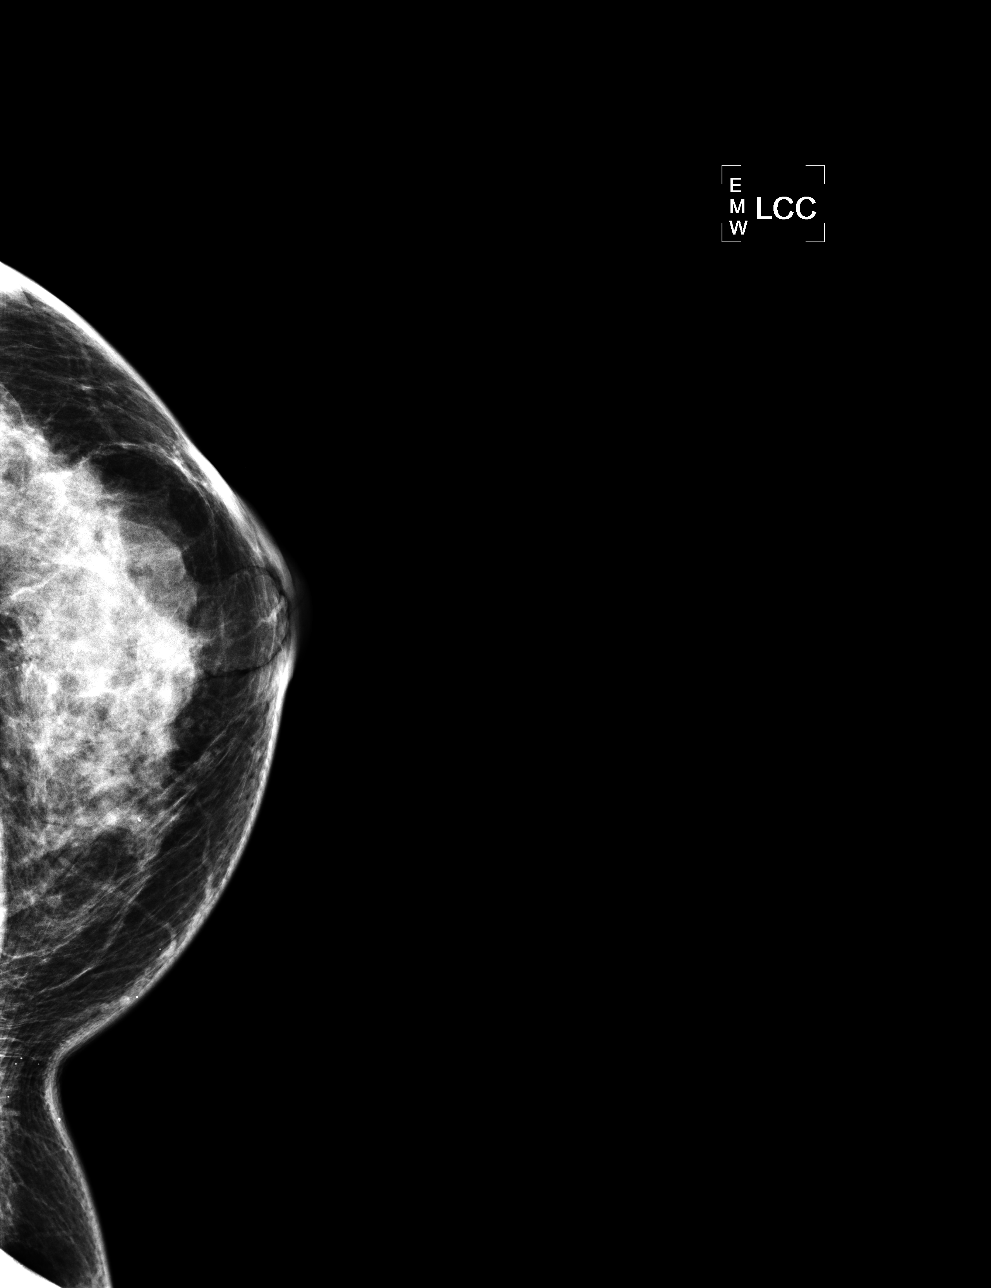

[L MLO]
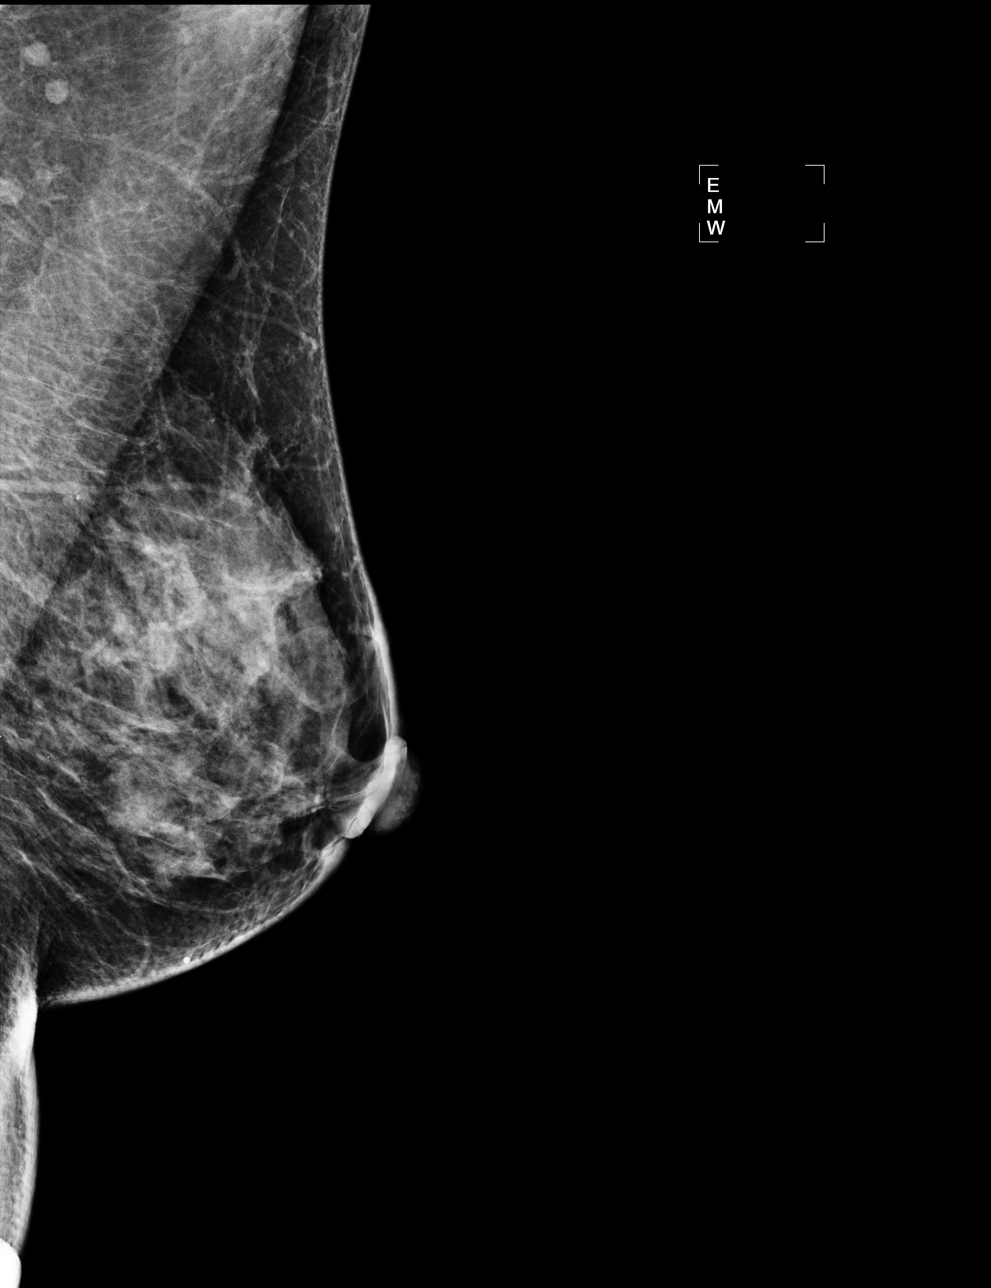

[R MLO]
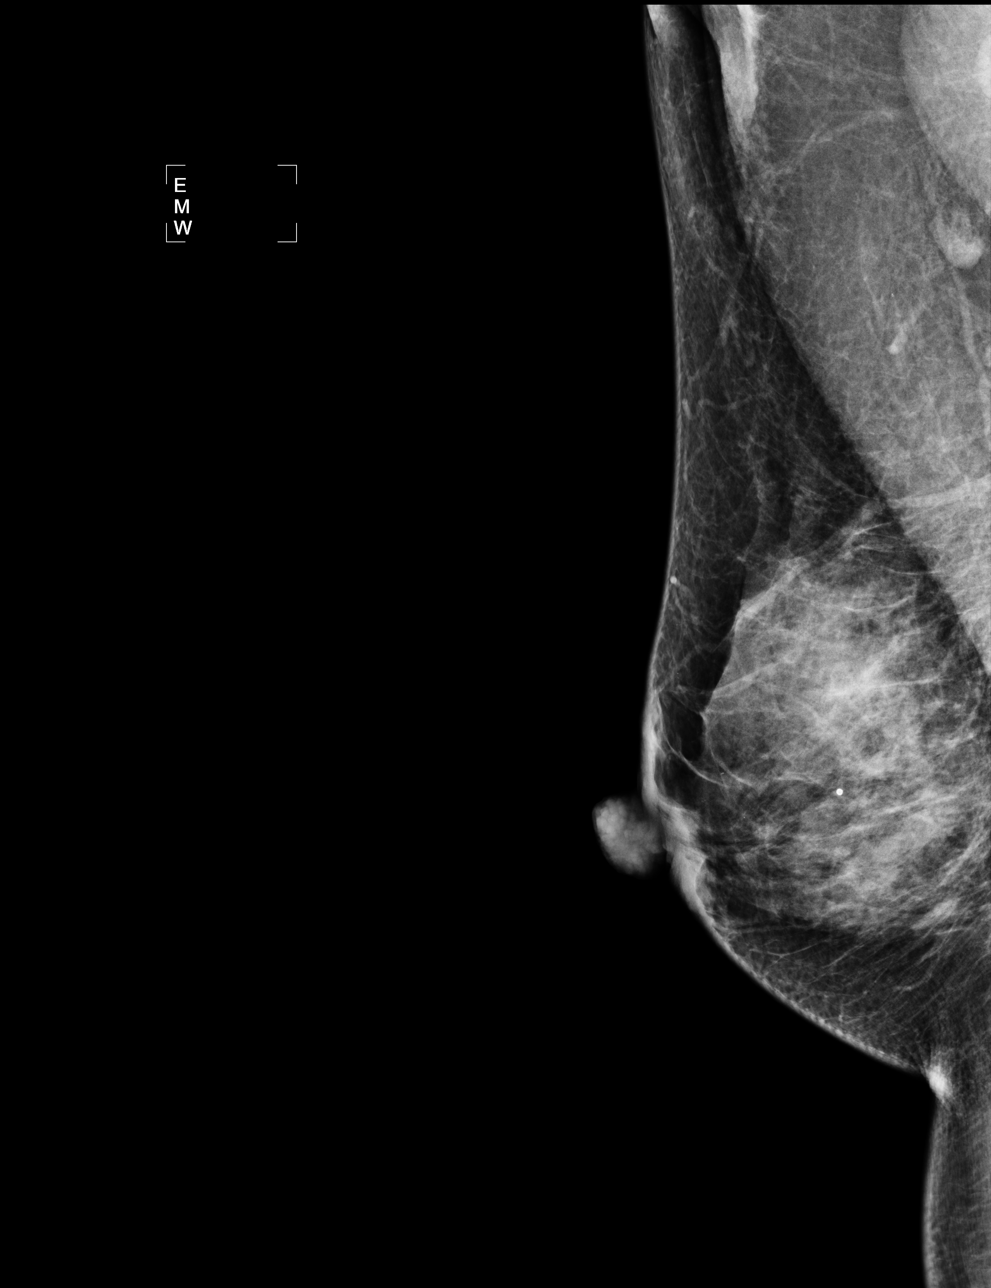

[4 of 4 positions shown; findings below may reference images not displayed]

ACR Breast Density Category d: The breast tissue is extremely dense,
which lowers the sensitivity of mammography.
FINDINGS: There are no findings suspicious for malignancy. Images were
processed with CAD.
IMPRESSION: No mammographic evidence of malignancy. A result letter of this
screening mammogram will be mailed directly to the patient.

RECOMMENDATION:
Screening mammogram in one year. (Code:[ZH])

BI-RADS CATEGORY  1: Negative.

## 2014-06-15 ENCOUNTER — Encounter: Payer: Self-pay | Admitting: Obstetrics and Gynecology

## 2015-04-30 ENCOUNTER — Other Ambulatory Visit: Payer: Self-pay | Admitting: Obstetrics and Gynecology

## 2015-04-30 DIAGNOSIS — Z1231 Encounter for screening mammogram for malignant neoplasm of breast: Secondary | ICD-10-CM

## 2015-06-02 ENCOUNTER — Ambulatory Visit (INDEPENDENT_AMBULATORY_CARE_PROVIDER_SITE_OTHER): Payer: 59

## 2015-06-02 DIAGNOSIS — Z1231 Encounter for screening mammogram for malignant neoplasm of breast: Secondary | ICD-10-CM

## 2015-06-02 IMAGING — MG MM DIGITAL SCREENING
4 series · 4 of 4 positions shown · non-contrast
Comparison: Previous exam(s).

CLINICAL DATA: Screening.

EXAM:
DIGITAL SCREENING BILATERAL MAMMOGRAM WITH CAD

[R CC]
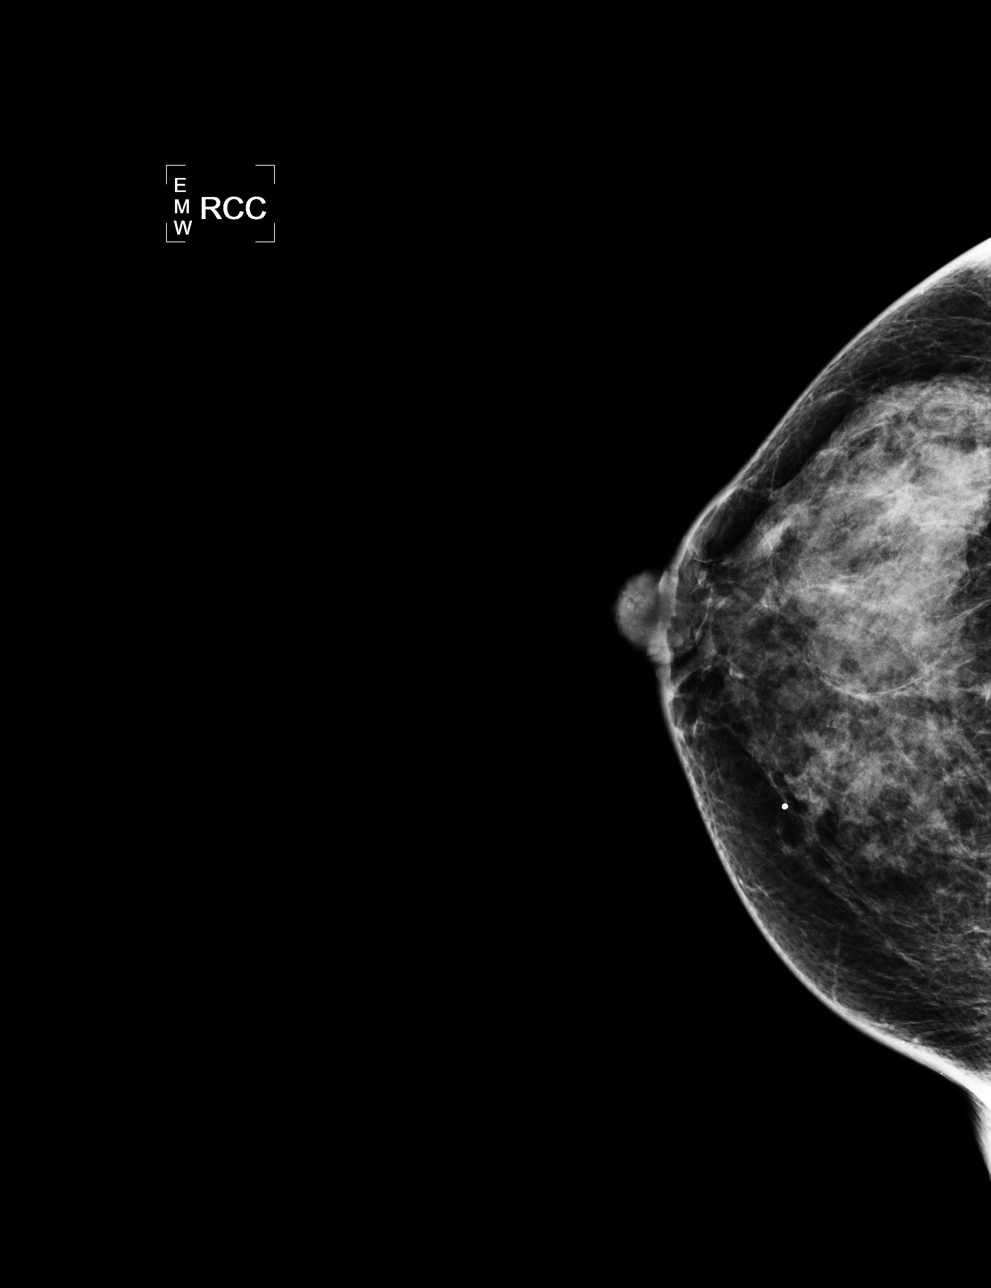

[L CC]
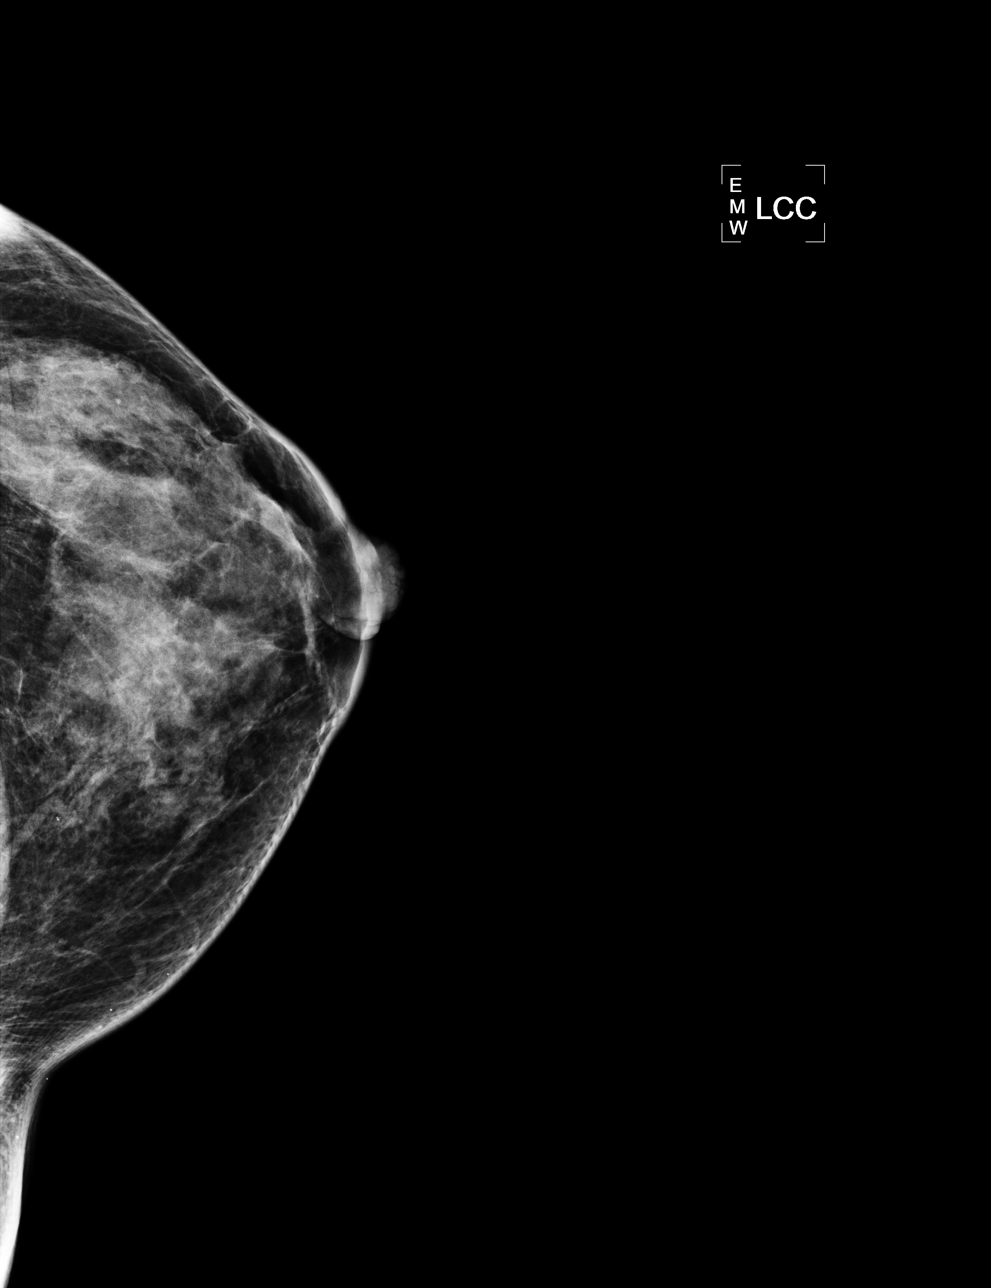

[L MLO]
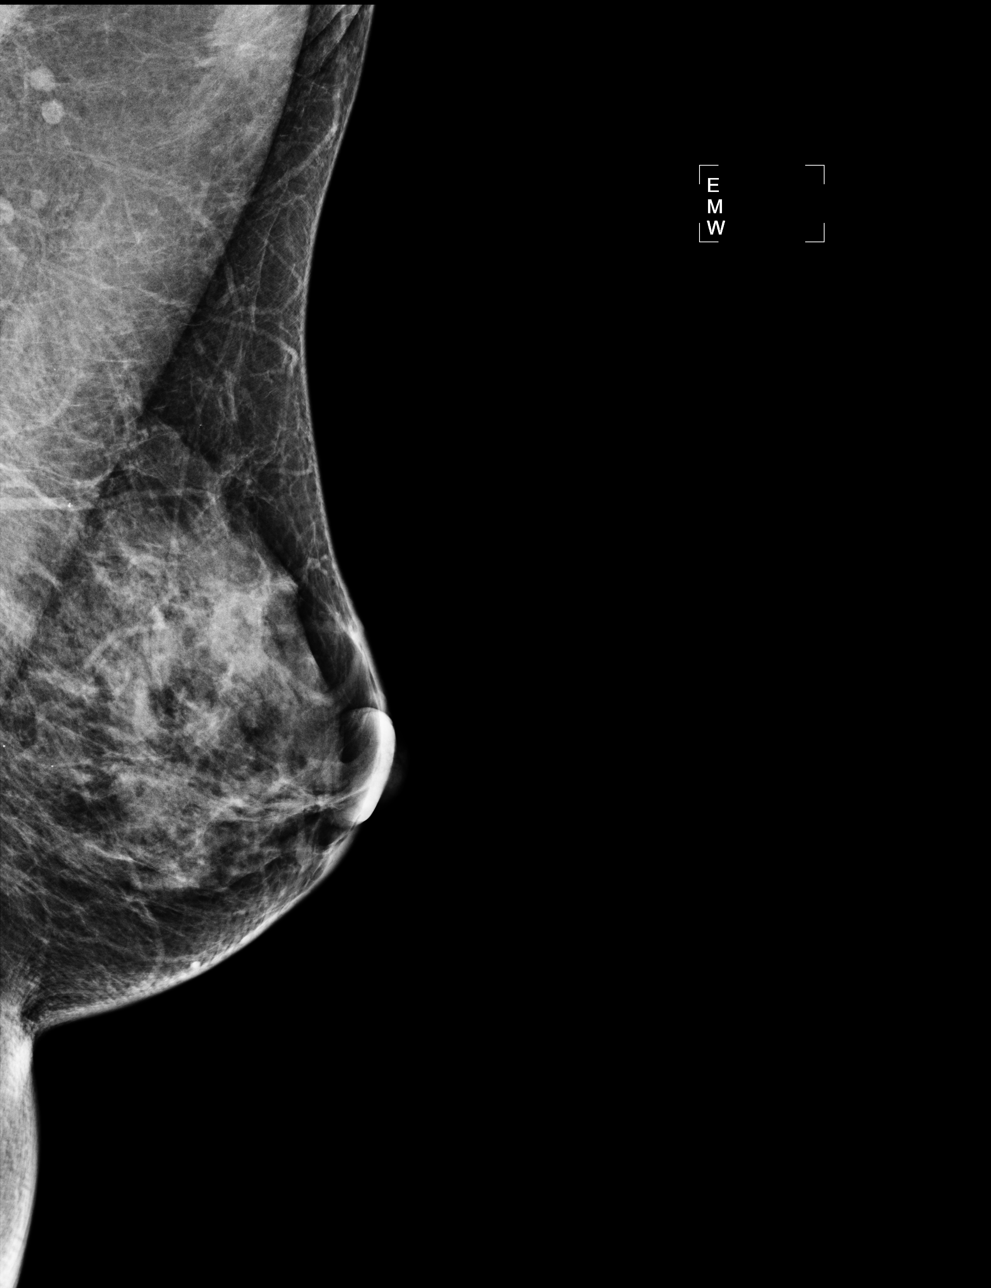

[R MLO]
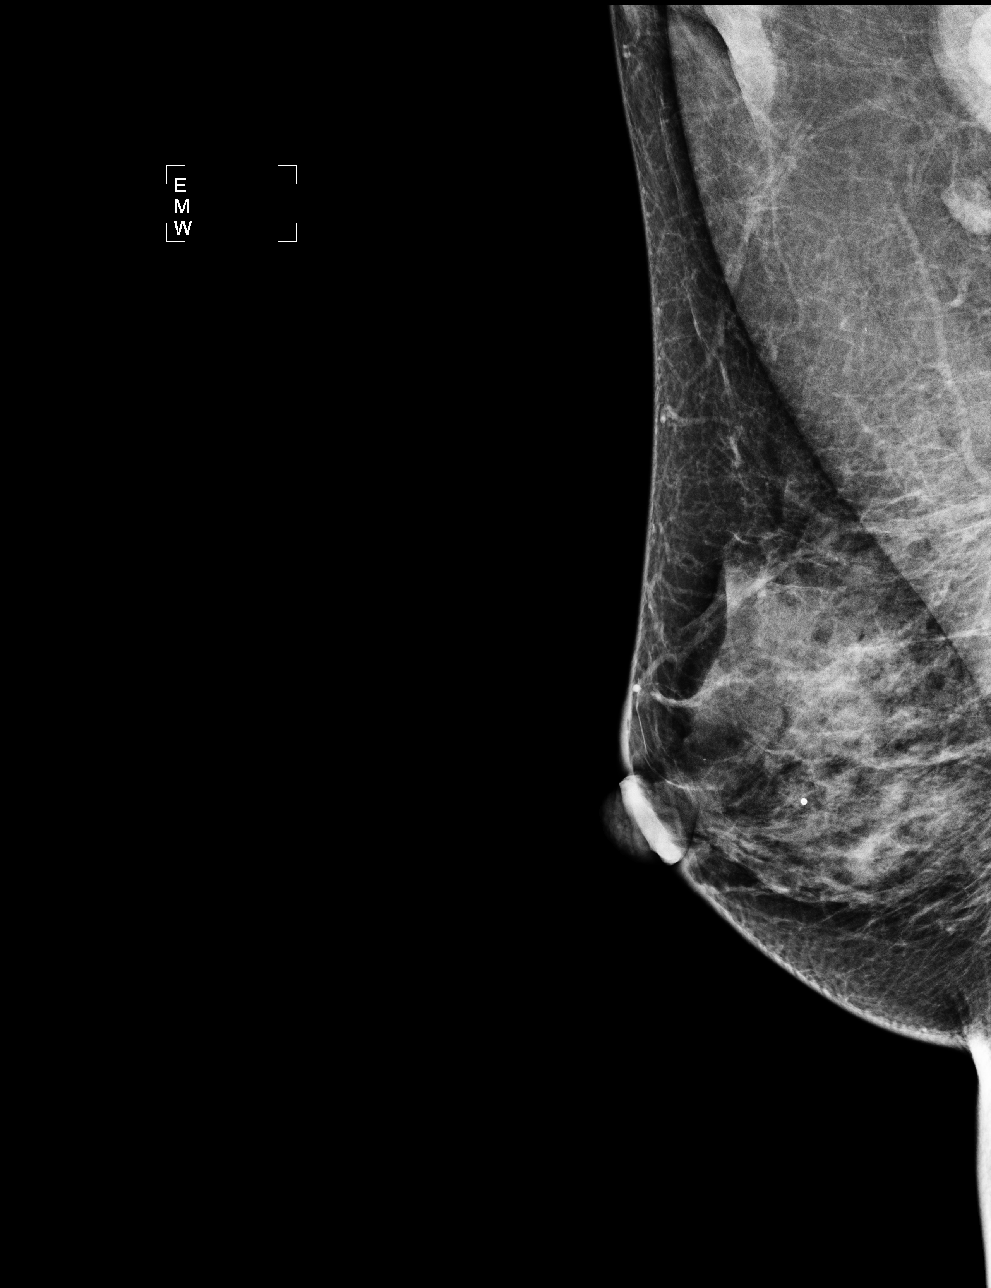

[4 of 4 positions shown; findings below may reference images not displayed]

ACR Breast Density Category c: The breast tissue is heterogeneously
dense, which may obscure small masses.
FINDINGS: There are no findings suspicious for malignancy. Images were
processed with CAD.
IMPRESSION: No mammographic evidence of malignancy. A result letter of this
screening mammogram will be mailed directly to the patient.

RECOMMENDATION:
Screening mammogram in one year. (Code:[0J])

BI-RADS CATEGORY  1: Negative.

## 2020-03-02 ENCOUNTER — Ambulatory Visit: Payer: 59 | Admitting: Internal Medicine

## 2020-04-06 ENCOUNTER — Institutional Professional Consult (permissible substitution): Payer: 59 | Admitting: Internal Medicine

## 2020-04-11 NOTE — Progress Notes (Signed)
Electrophysiology Office Note:    Date:  04/12/2020   ID:  Autumn Wyatt, DOB 09-04-1965, MRN [id number]  PCP:  Elayne Guerin  [place] Cardiologist:  No primary care provider on file.  [facility] Electrophysiologist:  Vickie Epley, MD   Referring MD: Blair Heys, PA-C   Chief Complaint: SVT  History of Present Illness:    Autumn Wyatt is a 54 y.o. female with a hx of SVT previously on verapamil prn who presents to clinic for further evaluation of her SVT due to a recent ER visit for SVT in the 220s. This episode was successfully treated with adenosine with a return to normal sinus rhythm. During this last episode, patient woke up from sleep with the feeling of a rapid heart rate and some shortness of breath. No chest pain. No syncope. She has had 3 episodes in the past year.   She had previously seen Dr Caryl Comes in 2011 with similar events. At that time the events were rare and seemed to be correlated to her menstrual cycle and/or hormone replacement therapy.    Past Medical History:  Diagnosis Date  . Abnormal pap   . Allergy   . DIAPHORESIS 12/27/2009   Qualifier: Diagnosis of  By: Burnett Kanaris    . Palpitations 12/27/2009   Qualifier: Diagnosis of  By: Burnett Kanaris    . Shortness of breath 12/27/2009   Qualifier: Diagnosis of  By: Burnett Kanaris    . SUPRAVENTRICULAR TACHYCARDIA 12/27/2009   Qualifier: Diagnosis of  By: Burnett Kanaris      Past Surgical History:  Procedure Laterality Date  . KNEE SURGERY    . REFRACTIVE SURGERY    . WISDOM TOOTH EXTRACTION      Current Medications: Current Meds  Medication Sig  . cetirizine (ZYRTEC) 10 MG tablet Take 10 mg by mouth daily.  Marland Kitchen ibuprofen (ADVIL,MOTRIN) 600 MG tablet Take 600 mg by mouth every 6 (six) hours as needed.  . Magnesium 200 MG TABS Take by mouth.  . norethindrone-ethinyl estradiol (LOESTRIN 1/20, 21,) 1-20 MG-MCG tablet Take 1 tablet by mouth daily.     Allergies:    Codeine, Demerol [meperidine], Meperidine and related, Midazolam, and Oxycodone   Social History   Socioeconomic History  . Marital status: Married    Spouse name: Not on file  . Number of children: Not on file  . Years of education: Not on file  . Highest education level: Not on file  Occupational History  . Not on file  Tobacco Use  . Smoking status: Never Smoker  . Smokeless tobacco: Never Used  Substance and Sexual Activity  . Alcohol use: Yes    Alcohol/week: 5.0 - 10.0 standard drinks    Types: 5 - 10 Glasses of wine per week  . Drug use: No  . Sexual activity: Yes    Birth control/protection: Pill  Other Topics Concern  . Not on file  Social History Narrative  . Not on file   Social Determinants of Health   Financial Resource Strain:   . Difficulty of Paying Living Expenses: Not on file  Food Insecurity:   . Worried About Charity fundraiser in the Last Year: Not on file  . Ran Out of Food in the Last Year: Not on file  Transportation Needs:   . Lack of Transportation (Medical): Not on file  . Lack of Transportation (Non-Medical): Not on file  Physical Activity:   . Days of Exercise per Week:  Not on file  . Minutes of Exercise per Session: Not on file  Stress:   . Feeling of Stress : Not on file  Social Connections:   . Frequency of Communication with Friends and Family: Not on file  . Frequency of Social Gatherings with Friends and Family: Not on file  . Attends Religious Services: Not on file  . Active Member of Clubs or Organizations: Not on file  . Attends Archivist Meetings: Not on file  . Marital Status: Not on file     Family History: The patient's family history includes Breast cancer (age of onset: 22) in her paternal aunt; Chronic granulomatous disease in her father and mother; Diabetes in her paternal grandmother; Heart disease in her paternal grandmother.  ROS:   Please see the history of present illness.    All other systems  reviewed and are negative.  EKGs/Labs/Other Studies Reviewed:    The following studies were reviewed today: ECG  ECG on 02/13/2020 showed a short RP, narrow QRS, tachycardia with a ventricular rate of 242.   EKG:  The ekg ordered today demonstrates sinus rhythm. No preexcitation.  Recent Labs: No results found for requested labs within last 8760 hours.  Recent Lipid Panel No results found for: CHOL, TRIG, HDL, CHOLHDL, VLDL, LDLCALC, LDLDIRECT  Physical Exam:    VS:  BP 136/86   Pulse 70   Ht 4\' 11"  (1.499 m)   Wt 123 lb 12.8 oz (56.2 kg)   SpO2 98%   BMI 25.00 kg/m     Wt Readings from Last 3 Encounters:  04/12/20 123 lb 12.8 oz (56.2 kg)  06/05/12 107 lb (48.5 kg)     GEN: Well nourished, well developed in no acute distress HEENT: Normal NECK: No JVD; No carotid bruits LYMPHATICS: No lymphadenopathy CARDIAC: RRR, no murmurs, rubs, gallops RESPIRATORY:  Clear to auscultation without rales, wheezing or rhonchi  ABDOMEN: Soft, non-tender, non-distended MUSCULOSKELETAL:  No edema; No deformity  SKIN: Warm and dry NEUROLOGIC:  Alert and oriented x 3 PSYCHIATRIC:  Normal affect   ASSESSMENT:    1. SVT (supraventricular tachycardia) (HCC)    PLAN:    In order of problems listed above:  1. SVT Patient has a history of adenosine sensitive SVT. ECG from 02/13/2020 at [facility] show narrow QRS tach. Recommend we proceed with EP study and ablation for definitive management. Will plan to update the echo prior to the procedure.  Risk, benefits, and alternatives to EP study and radiofrequency ablation for SVT were also discussed in detail today. These risks include but are not limited to complete heart block, stroke, bleeding, vascular damage, tamponade, perforation, and death. The patient understands these risk and wishes to proceed.  We will therefore proceed with catheter ablation at the next available time.  Carto and ICE are requested for the procedure.  Will also obtain echo  prior to the procedure to exclude structural heart disease.  Medication Adjustments/Labs and Tests Ordered: Current medicines are reviewed at length with the patient today.  Concerns regarding medicines are outlined above.  Orders Placed This Encounter  Procedures  . Basic Metabolic Panel (BMET)  . CBC w/Diff  . EKG 12-Lead  . ECHOCARDIOGRAM COMPLETE   No orders of the defined types were placed in this encounter.   Patient Instructions  Medication Instructions:  Your physician recommends that you continue on your current medications as directed. Please refer to the Current Medication list given to you today.  Labwork: None ordered.  Testing/Procedures: Your physician has requested that you have an echocardiogram. Echocardiography is a painless test that uses sound waves to create images of your heart. It provides your doctor with information about the size and shape of your heart and how well your heart's chambers and valves are working. This procedure takes approximately one hour. There are no restrictions for this procedure.  Please schedule for ECHO  Follow-Up:  SEE INSTRUCTION LETTER  Any Other Special Instructions Will Be Listed Below (If Applicable).  If you need a refill on your cardiac medications before your next appointment, please call your pharmacy.    Cardiac Ablation Cardiac ablation is a procedure to disable (ablate) a small amount of heart tissue in very specific places. The heart has many electrical connections. Sometimes these connections are abnormal and can cause the heart to beat very fast or irregularly. Ablating some of the problem areas can improve the heart rhythm or return it to normal. Ablation may be done for people who:  Have Wolff-Parkinson-White syndrome.  Have fast heart rhythms (tachycardia).  Have taken medicines for an abnormal heart rhythm (arrhythmia) that were not effective or caused side effects.  Have a high-risk heartbeat that may  be life-threatening. During the procedure, a small incision is made in the neck or the groin, and a long, thin, flexible tube (catheter) is inserted into the incision and moved to the heart. Small devices (electrodes) on the tip of the catheter will send out electrical currents. A type of X-ray (fluoroscopy) will be used to help guide the catheter and to provide images of the heart. Tell a health care provider about:  Any allergies you have.  All medicines you are taking, including vitamins, herbs, eye drops, creams, and over-the-counter medicines.  Any problems you or family members have had with anesthetic medicines.  Any blood disorders you have.  Any surgeries you have had.  Any medical conditions you have, such as kidney failure.  Whether you are pregnant or may be pregnant. What are the risks? Generally, this is a safe procedure. However, problems may occur, including:  Infection.  Bruising and bleeding at the catheter insertion site.  Bleeding into the chest, especially into the sac that surrounds the heart. This is a serious complication.  Stroke or blood clots.  Damage to other structures or organs.  Allergic reaction to medicines or dyes.  Need for a permanent pacemaker if the normal electrical system is damaged. A pacemaker is a small computer that sends electrical signals to the heart and helps your heart beat normally.  The procedure not being fully effective. This may not be recognized until months later. Repeat ablation procedures are sometimes required. What happens before the procedure?  Follow instructions from your health care provider about eating or drinking restrictions.  Ask your health care provider about: ? Changing or stopping your regular medicines. This is especially important if you are taking diabetes medicines or blood thinners. ? Taking medicines such as aspirin and ibuprofen. These medicines can thin your blood. Do not take these medicines  before your procedure if your health care provider instructs you not to.  Plan to have someone take you home from the hospital or clinic.  If you will be going home right after the procedure, plan to have someone with you for 24 hours. What happens during the procedure?  To lower your risk of infection: ? Your health care team will wash or sanitize their hands. ? Your skin will be washed with soap. ?  Hair may be removed from the incision area.  An IV tube will be inserted into one of your veins.  You will be given a medicine to help you relax (sedative).  The skin on your neck or groin will be numbed.  An incision will be made in your neck or your groin.  A needle will be inserted through the incision and into a large vein in your neck or groin.  A catheter will be inserted into the needle and moved to your heart.  Dye may be injected through the catheter to help your surgeon see the area of the heart that needs treatment.  Electrical currents will be sent from the catheter to ablate heart tissue in desired areas. There are three types of energy that may be used to ablate heart tissue: ? Heat (radiofrequency energy). ? Laser energy. ? Extreme cold (cryoablation).  When the necessary tissue has been ablated, the catheter will be removed.  Pressure will be held on the catheter insertion area to prevent excessive bleeding.  A bandage (dressing) will be placed over the catheter insertion area. The procedure may vary among health care providers and hospitals. What happens after the procedure?  Your blood pressure, heart rate, breathing rate, and blood oxygen level will be monitored until the medicines you were given have worn off.  Your catheter insertion area will be monitored for bleeding. You will need to lie still for a few hours to ensure that you do not bleed from the catheter insertion area.  Do not drive for 24 hours or as long as directed by your health care  provider. Summary  Cardiac ablation is a procedure to disable (ablate) a small amount of heart tissue in very specific places. Ablating some of the problem areas can improve the heart rhythm or return it to normal.  During the procedure, electrical currents will be sent from the catheter to ablate heart tissue in desired areas. This information is not intended to replace advice given to you by your health care provider. Make sure you discuss any questions you have with your health care provider. Document Revised: 01/21/2018 Document Reviewed: 06/19/2016 Elsevier Patient Education  2020 [place], Vickie Epley, MD  04/12/2020 5:22 PM    [facility]    Anticoagulation instructions: The patient is not on anticoagulation.  Medication instructions morning of: The patient does not need to hold any medications the morning of the procedure.   Discharge: Our plan will be to discharge the patient same day after a period of observation

## 2020-04-12 ENCOUNTER — Ambulatory Visit (INDEPENDENT_AMBULATORY_CARE_PROVIDER_SITE_OTHER): Payer: 59 | Admitting: Cardiology

## 2020-04-12 ENCOUNTER — Other Ambulatory Visit: Payer: Self-pay

## 2020-04-12 ENCOUNTER — Telehealth: Payer: Self-pay

## 2020-04-12 ENCOUNTER — Encounter: Payer: Self-pay | Admitting: Cardiology

## 2020-04-12 VITALS — BP 136/86 | HR 70 | Ht 59.0 in | Wt 123.8 lb

## 2020-04-12 DIAGNOSIS — I471 Supraventricular tachycardia: Secondary | ICD-10-CM

## 2020-04-12 NOTE — Telephone Encounter (Signed)
Pt tested positive for covid on March 09, 2020.  Pt is fully vaccinated.  No covid test needed for SVT ablation scheduled for 05/21/2020.  Cath lab notified.

## 2020-04-12 NOTE — Patient Instructions (Addendum)
Medication Instructions:  Your physician recommends that you continue on your current medications as directed. Please refer to the Current Medication list given to you today.  Labwork: None ordered.  Testing/Procedures: Your physician has requested that you have an echocardiogram. Echocardiography is a painless test that uses sound waves to create images of your heart. It provides your doctor with information about the size and shape of your heart and how well your heart's chambers and valves are working. This procedure takes approximately one hour. There are no restrictions for this procedure.  Please schedule for ECHO  Follow-Up:  SEE INSTRUCTION LETTER  Any Other Special Instructions Will Be Listed Below (If Applicable).  If you need a refill on your cardiac medications before your next appointment, please call your pharmacy.    Cardiac Ablation Cardiac ablation is a procedure to disable (ablate) a small amount of heart tissue in very specific places. The heart has many electrical connections. Sometimes these connections are abnormal and can cause the heart to beat very fast or irregularly. Ablating some of the problem areas can improve the heart rhythm or return it to normal. Ablation may be done for people who:  Have Wolff-Parkinson-White syndrome.  Have fast heart rhythms (tachycardia).  Have taken medicines for an abnormal heart rhythm (arrhythmia) that were not effective or caused side effects.  Have a high-risk heartbeat that may be life-threatening. During the procedure, a small incision is made in the neck or the groin, and a long, thin, flexible tube (catheter) is inserted into the incision and moved to the heart. Small devices (electrodes) on the tip of the catheter will send out electrical currents. A type of X-ray (fluoroscopy) will be used to help guide the catheter and to provide images of the heart. Tell a health care provider about:  Any allergies you have.  All  medicines you are taking, including vitamins, herbs, eye drops, creams, and over-the-counter medicines.  Any problems you or family members have had with anesthetic medicines.  Any blood disorders you have.  Any surgeries you have had.  Any medical conditions you have, such as kidney failure.  Whether you are pregnant or may be pregnant. What are the risks? Generally, this is a safe procedure. However, problems may occur, including:  Infection.  Bruising and bleeding at the catheter insertion site.  Bleeding into the chest, especially into the sac that surrounds the heart. This is a serious complication.  Stroke or blood clots.  Damage to other structures or organs.  Allergic reaction to medicines or dyes.  Need for a permanent pacemaker if the normal electrical system is damaged. A pacemaker is a small computer that sends electrical signals to the heart and helps your heart beat normally.  The procedure not being fully effective. This may not be recognized until months later. Repeat ablation procedures are sometimes required. What happens before the procedure?  Follow instructions from your health care provider about eating or drinking restrictions.  Ask your health care provider about: ? Changing or stopping your regular medicines. This is especially important if you are taking diabetes medicines or blood thinners. ? Taking medicines such as aspirin and ibuprofen. These medicines can thin your blood. Do not take these medicines before your procedure if your health care provider instructs you not to.  Plan to have someone take you home from the hospital or clinic.  If you will be going home right after the procedure, plan to have someone with you for 24 hours. What happens  during the procedure?  To lower your risk of infection: ? Your health care team will wash or sanitize their hands. ? Your skin will be washed with soap. ? Hair may be removed from the incision  area.  An IV tube will be inserted into one of your veins.  You will be given a medicine to help you relax (sedative).  The skin on your neck or groin will be numbed.  An incision will be made in your neck or your groin.  A needle will be inserted through the incision and into a large vein in your neck or groin.  A catheter will be inserted into the needle and moved to your heart.  Dye may be injected through the catheter to help your surgeon see the area of the heart that needs treatment.  Electrical currents will be sent from the catheter to ablate heart tissue in desired areas. There are three types of energy that may be used to ablate heart tissue: ? Heat (radiofrequency energy). ? Laser energy. ? Extreme cold (cryoablation).  When the necessary tissue has been ablated, the catheter will be removed.  Pressure will be held on the catheter insertion area to prevent excessive bleeding.  A bandage (dressing) will be placed over the catheter insertion area. The procedure may vary among health care providers and hospitals. What happens after the procedure?  Your blood pressure, heart rate, breathing rate, and blood oxygen level will be monitored until the medicines you were given have worn off.  Your catheter insertion area will be monitored for bleeding. You will need to lie still for a few hours to ensure that you do not bleed from the catheter insertion area.  Do not drive for 24 hours or as long as directed by your health care provider. Summary  Cardiac ablation is a procedure to disable (ablate) a small amount of heart tissue in very specific places. Ablating some of the problem areas can improve the heart rhythm or return it to normal.  During the procedure, electrical currents will be sent from the catheter to ablate heart tissue in desired areas. This information is not intended to replace advice given to you by your health care provider. Make sure you discuss any  questions you have with your health care provider. Document Revised: 01/21/2018 Document Reviewed: 06/19/2016 Elsevier Patient Education  [place].

## 2020-05-03 ENCOUNTER — Telehealth: Payer: Self-pay | Admitting: Cardiology

## 2020-05-03 NOTE — Telephone Encounter (Signed)
New message:    Patient has a apt coming up 05/21/20 and she would like for some one to call she has questions.

## 2020-05-04 NOTE — Telephone Encounter (Signed)
Spoke to the patient about gym limitations post percedure.  Carpal tunnel percedure is okay to have after the first week post ablation per Dr. Claudie Revering RN Sonia Baller.

## 2020-05-04 NOTE — Telephone Encounter (Signed)
Follow-up:  Patient called again asking to speak with Sonia Baller about scheduling another procedure. She would like to get this other procedure done before her ablation she has scheduled . Please call to address patient concerns

## 2020-05-06 ENCOUNTER — Telehealth: Payer: Self-pay | Admitting: *Deleted

## 2020-05-06 NOTE — Telephone Encounter (Signed)
   [facility] Pre-operative Risk Assessment    HEARTCARE STAFF: - Please ensure there is not already an duplicate clearance open for this procedure. - Under Visit Info/Reason for Call, type in Other and utilize the format Clearance MM/DD/YY or Clearance TBD. Do not use dashes or single digits. - If request is for dental extraction, please clarify the # of teeth to be extracted.  Request for surgical clearance:  1. What type of surgery is being performed? LEFT CARPAL TUNNEL RELEASE, LEFT CUBITAL TUNNEL RELEASE  2. When is this surgery scheduled? 06/22/20  3. What type of clearance is required (medical clearance vs. Pharmacy clearance to hold med vs. Both)? MEDICAL  4. Are there any medications that need to be held prior to surgery and how long? NONE LISTED  5. Practice name and name of physician performing surgery? [facility]; DR. Gwyndolyn Saxon [facility]   6. What is the office phone number? [phone]   7.   What is the office fax number? [place]  8.   Anesthesia type (None, local, MAC, general) ? BLOCK WITH IV SEDATION   Julaine Hua 05/06/2020, 11:34 AM  _________________________________________________________________   (provider comments below)

## 2020-05-06 NOTE — Telephone Encounter (Signed)
   Primary Cardiologist: Vickie Epley, MD  Chart reviewed as part of pre-operative protocol coverage. Given past medical history and time since last visit, based on ACC/AHA guidelines, Autumn Wyatt would be at acceptable risk for the planned procedure without further cardiovascular testing.   She has an RCRI class I risk, 0.4% risk of major cardiac event.  I will route this recommendation to the requesting party via Epic fax function and remove from pre-op pool.  Please call with questions.  Jossie Ng. Stacee Earp NP-C    05/06/2020, 12:04 PM [address] Office [phone] Fax [phone]

## 2020-05-18 ENCOUNTER — Telehealth: Payer: Self-pay | Admitting: Cardiology

## 2020-05-18 NOTE — Telephone Encounter (Signed)
Patient states procedure scheduled for 05/21/20 with Dr. Quentin Ore will not be covered by her insurance. Please return call to discuss.

## 2020-05-18 NOTE — Telephone Encounter (Signed)
Call returned to Pt.  Advised we are appealing her denial.  Advised will know more tomorrow.  She will get lab work and echo as scheduled 05/19/2020.

## 2020-05-19 ENCOUNTER — Other Ambulatory Visit: Payer: Self-pay

## 2020-05-19 ENCOUNTER — Other Ambulatory Visit: Payer: 59 | Admitting: *Deleted

## 2020-05-19 ENCOUNTER — Ambulatory Visit (HOSPITAL_COMMUNITY): Payer: 59 | Attending: Cardiovascular Disease

## 2020-05-19 DIAGNOSIS — I471 Supraventricular tachycardia: Secondary | ICD-10-CM | POA: Diagnosis not present

## 2020-05-19 LAB — CBC WITH DIFFERENTIAL/PLATELET
Basophils Absolute: 0 10*3/uL (ref 0.0–0.2)
Basos: 0 %
EOS (ABSOLUTE): 0.1 10*3/uL (ref 0.0–0.4)
Eos: 1 %
Hematocrit: 43.3 % (ref 34.0–46.6)
Hemoglobin: 14.7 g/dL (ref 11.1–15.9)
Lymphocytes Absolute: 1.8 10*3/uL (ref 0.7–3.1)
Lymphs: 17 %
MCH: 33.6 pg — ABNORMAL HIGH (ref 26.6–33.0)
MCHC: 33.9 g/dL (ref 31.5–35.7)
MCV: 99 fL — ABNORMAL HIGH (ref 79–97)
Monocytes Absolute: 1.1 10*3/uL — ABNORMAL HIGH (ref 0.1–0.9)
Monocytes: 11 %
Neutrophils Absolute: 7.5 10*3/uL — ABNORMAL HIGH (ref 1.4–7.0)
Neutrophils: 71 %
Platelets: 210 10*3/uL (ref 150–450)
RBC: 4.38 x10E6/uL (ref 3.77–5.28)
RDW: 13.6 % (ref 11.7–15.4)
WBC: 10.5 10*3/uL (ref 3.4–10.8)

## 2020-05-19 LAB — BASIC METABOLIC PANEL
BUN/Creatinine Ratio: 21 (ref 9–23)
BUN: 11 mg/dL (ref 6–24)
CO2: 24 mmol/L (ref 20–29)
Calcium: 9.2 mg/dL (ref 8.7–10.2)
Chloride: 105 mmol/L (ref 96–106)
Creatinine, Ser: 0.53 mg/dL — ABNORMAL LOW (ref 0.57–1.00)
GFR calc Af Amer: 124 mL/min/{1.73_m2} (ref >59)
GFR calc non Af Amer: 108 mL/min/{1.73_m2} (ref >59)
Glucose: 89 mg/dL (ref 65–99)
Potassium: 3.8 mmol/L (ref 3.5–5.2)
Sodium: 138 mmol/L (ref 134–144)

## 2020-05-19 LAB — ECHOCARDIOGRAM COMPLETE
Area-P 1/2: 4.17 cm2
S' Lateral: 2.5 cm

## 2020-05-20 NOTE — Progress Notes (Signed)
Patient not home, reviewed instructions with husband on the following items: Arrival time 0530 Nothing to eat or drink after midnight No meds AM of procedure Responsible person to drive you home and stay with you for 24 hrs

## 2020-05-20 NOTE — Telephone Encounter (Signed)
Peer to peer completed with [employer].  Procedure approved.

## 2020-05-21 ENCOUNTER — Ambulatory Visit (HOSPITAL_COMMUNITY): Payer: 59 | Admitting: Certified Registered Nurse Anesthetist

## 2020-05-21 ENCOUNTER — Other Ambulatory Visit: Payer: Self-pay

## 2020-05-21 ENCOUNTER — Encounter (HOSPITAL_COMMUNITY): Payer: Self-pay | Admitting: Cardiology

## 2020-05-21 ENCOUNTER — Ambulatory Visit (HOSPITAL_COMMUNITY)
Admission: RE | Disposition: A | Discharge status: Home / Self Care | Payer: Self-pay | Source: Home / Self Care | Attending: Cardiology

## 2020-05-21 ENCOUNTER — Ambulatory Visit (HOSPITAL_COMMUNITY)
Admission: RE | Admit: 2020-05-21 | Discharge: 2020-05-21 | Disposition: A | Discharge status: Home / Self Care | Payer: 59 | Attending: Cardiology | Admitting: Cardiology

## 2020-05-21 DIAGNOSIS — Z888 Allergy status to other drugs, medicaments and biological substances status: Secondary | ICD-10-CM | POA: Diagnosis not present

## 2020-05-21 DIAGNOSIS — I471 Supraventricular tachycardia: Secondary | ICD-10-CM | POA: Insufficient documentation

## 2020-05-21 DIAGNOSIS — Z8249 Family history of ischemic heart disease and other diseases of the circulatory system: Secondary | ICD-10-CM | POA: Diagnosis not present

## 2020-05-21 DIAGNOSIS — Z793 Long term (current) use of hormonal contraceptives: Secondary | ICD-10-CM | POA: Diagnosis not present

## 2020-05-21 DIAGNOSIS — Z885 Allergy status to narcotic agent status: Secondary | ICD-10-CM | POA: Insufficient documentation

## 2020-05-21 HISTORY — PX: SVT ABLATION: EP1225

## 2020-05-21 LAB — PREGNANCY, URINE: Preg Test, Ur: NEGATIVE

## 2020-05-21 SURGERY — SVT ABLATION
Anesthesia: Monitor Anesthesia Care

## 2020-05-21 MED ORDER — SODIUM CHLORIDE 0.9% FLUSH: 3.0000 mL | INTRAVENOUS | Status: DC | PRN

## 2020-05-21 MED ORDER — LIDOCAINE 2% (20 MG/ML) 5 ML SYRINGE
INTRAMUSCULAR | Status: AC
  Filled 2020-05-21: qty 5

## 2020-05-21 MED ORDER — DEXAMETHASONE SODIUM PHOSPHATE 10 MG/ML IJ SOLN
INTRAMUSCULAR | Status: DC | PRN
  Administered 2020-05-21: 5 mg via INTRAVENOUS

## 2020-05-21 MED ORDER — PHENYLEPHRINE HCL-NACL 10-0.9 MG/250ML-% IV SOLN: INTRAVENOUS | Status: DC | PRN

## 2020-05-21 MED ORDER — SODIUM CHLORIDE 0.9 % IV SOLN: 250.0000 mL | INTRAVENOUS | Status: DC | PRN

## 2020-05-21 MED ORDER — BUPIVACAINE HCL (PF) 0.25 % IJ SOLN
INTRAMUSCULAR | Status: AC
  Filled 2020-05-21: qty 30

## 2020-05-21 MED ORDER — ACETAMINOPHEN 325 MG PO TABS
650.0000 mg | ORAL_TABLET | ORAL | Status: DC | PRN
  Filled 2020-05-21: qty 2

## 2020-05-21 MED ORDER — HEPARIN (PORCINE) IN NACL 1000-0.9 UT/500ML-% IV SOLN
INTRAVENOUS | Status: DC | PRN
  Administered 2020-05-21 (×2): 500 mL

## 2020-05-21 MED ORDER — FENTANYL CITRATE (PF) 100 MCG/2ML IJ SOLN
INTRAMUSCULAR | Status: DC | PRN
Start: 2020-05-21 — End: 2020-05-21
  Administered 2020-05-21 (×4): 25 ug via INTRAVENOUS

## 2020-05-21 MED ORDER — ONDANSETRON HCL 4 MG/2ML IJ SOLN: 4.0000 mg | Freq: Four times a day (QID) | INTRAMUSCULAR | Status: DC | PRN

## 2020-05-21 MED ORDER — SODIUM CHLORIDE 0.9 % IV SOLN: INTRAVENOUS | Status: DC

## 2020-05-21 MED ORDER — ISOPROTERENOL HCL 0.2 MG/ML IJ SOLN
INTRAVENOUS | Status: DC | PRN
  Administered 2020-05-21: 2 ug/min via INTRAVENOUS

## 2020-05-21 MED ORDER — ONDANSETRON HCL 4 MG/2ML IJ SOLN
INTRAMUSCULAR | Status: DC | PRN
  Administered 2020-05-21: 4 mg via INTRAVENOUS

## 2020-05-21 MED ORDER — ROCURONIUM BROMIDE 10 MG/ML (PF) SYRINGE
PREFILLED_SYRINGE | INTRAVENOUS | Status: AC
  Filled 2020-05-21: qty 10

## 2020-05-21 MED ORDER — HEPARIN (PORCINE) IN NACL 1000-0.9 UT/500ML-% IV SOLN
INTRAVENOUS | Status: AC
  Filled 2020-05-21: qty 500

## 2020-05-21 MED ORDER — ONDANSETRON HCL 4 MG/2ML IJ SOLN
INTRAMUSCULAR | Status: AC
  Filled 2020-05-21: qty 2

## 2020-05-21 MED ORDER — DEXMEDETOMIDINE (PRECEDEX) IN NS 20 MCG/5ML (4 MCG/ML) IV SYRINGE
PREFILLED_SYRINGE | INTRAVENOUS | Status: DC | PRN
  Administered 2020-05-21 (×2): 4 ug via INTRAVENOUS
  Administered 2020-05-21: 8 ug via INTRAVENOUS
  Administered 2020-05-21: 4 ug via INTRAVENOUS

## 2020-05-21 MED ORDER — DEXMEDETOMIDINE HCL IN NACL 400 MCG/100ML IV SOLN
INTRAVENOUS | Status: DC | PRN
  Administered 2020-05-21: .4 ug/kg/h via INTRAVENOUS

## 2020-05-21 MED ORDER — PROPOFOL 10 MG/ML IV BOLUS
INTRAVENOUS | Status: AC
  Filled 2020-05-21: qty 20

## 2020-05-21 MED ORDER — ISOPROTERENOL HCL 0.2 MG/ML IJ SOLN
INTRAMUSCULAR | Status: AC
  Filled 2020-05-21: qty 5

## 2020-05-21 MED ORDER — PHENYLEPHRINE 40 MCG/ML (10ML) SYRINGE FOR IV PUSH (FOR BLOOD PRESSURE SUPPORT)
PREFILLED_SYRINGE | INTRAVENOUS | Status: AC
  Filled 2020-05-21: qty 10

## 2020-05-21 MED ORDER — HEPARIN SODIUM (PORCINE) 1000 UNIT/ML IJ SOLN
INTRAMUSCULAR | Status: AC
  Filled 2020-05-21: qty 1

## 2020-05-21 MED ORDER — SODIUM CHLORIDE 0.9% FLUSH: 3.0000 mL | Freq: Two times a day (BID) | INTRAVENOUS | Status: DC

## 2020-05-21 MED ORDER — EPHEDRINE 5 MG/ML INJ
INTRAVENOUS | Status: AC
  Filled 2020-05-21: qty 10

## 2020-05-21 MED ORDER — DEXAMETHASONE SODIUM PHOSPHATE 10 MG/ML IJ SOLN
INTRAMUSCULAR | Status: AC
  Filled 2020-05-21: qty 1

## 2020-05-21 MED ORDER — SUCCINYLCHOLINE CHLORIDE 200 MG/10ML IV SOSY
PREFILLED_SYRINGE | INTRAVENOUS | Status: AC
  Filled 2020-05-21: qty 10

## 2020-05-21 MED ORDER — BUPIVACAINE HCL (PF) 0.25 % IJ SOLN
INTRAMUSCULAR | Status: DC | PRN
  Administered 2020-05-21: 20 mL

## 2020-05-21 SURGICAL SUPPLY — 14 items
BLANKET WARM UNDERBOD FULL ACC (MISCELLANEOUS) ×3 IMPLANT
CATH DECANAV F CURVE (CATHETERS) ×3 IMPLANT
CATH JOSEPH QUAD ALLRED 6F REP (CATHETERS) ×3 IMPLANT
CATH QUAD COURNAND 5FR REPROC (CATHETERS) ×3 IMPLANT
CATH SMTCH THERMOCOOL SF DF (CATHETERS) ×3 IMPLANT
CLOSURE PERCLOSE PROSTYLE (VASCULAR PRODUCTS) ×9 IMPLANT
PACK EP LATEX FREE (CUSTOM PROCEDURE TRAY) ×3
PACK EP LF (CUSTOM PROCEDURE TRAY) ×1 IMPLANT
PAD PRO RADIOLUCENT 2001M-C (PAD) ×3 IMPLANT
PATCH CARTO3 (PAD) ×3 IMPLANT
SHEATH PINNACLE 7F 10CM (SHEATH) ×3 IMPLANT
SHEATH PINNACLE 8F 10CM (SHEATH) ×6 IMPLANT
SHEATH PROBE COVER 6X72 (BAG) ×3 IMPLANT
TUBING SMART ABLATE COOLFLOW (TUBING) ×3 IMPLANT

## 2020-05-21 NOTE — Anesthesia Procedure Notes (Addendum)
Procedure Name: MAC Date/Time: 05/21/2020 7:43 AM Performed by: Reece Agar, CRNA Pre-anesthesia Checklist: Patient identified, Emergency Drugs available, Suction available, Patient being monitored and Timeout performed Patient Re-evaluated:Patient Re-evaluated prior to induction Oxygen Delivery Method: Simple face mask

## 2020-05-21 NOTE — Discharge Instructions (Signed)

## 2020-05-21 NOTE — Progress Notes (Signed)
R groin 7 fr sheath pulled, manual pressure held X 15 minutes.  Post instructions given, verbalizes understanding,  Site 0

## 2020-05-21 NOTE — Anesthesia Postprocedure Evaluation (Signed)
Anesthesia Post Note  Patient: Autumn Wyatt  Procedure(s) Performed: SVT ABLATION (N/A )     Patient location during evaluation: PACU Anesthesia Type: MAC Level of consciousness: awake and alert Pain management: pain level controlled Vital Signs Assessment: post-procedure vital signs reviewed and stable Respiratory status: spontaneous breathing, nonlabored ventilation and respiratory function stable Cardiovascular status: blood pressure returned to baseline and stable Postop Assessment: no apparent nausea or vomiting Anesthetic complications: no   No complications documented.  Last Vitals:  Vitals:   05/21/20 1015 05/21/20 1027  BP: 92/63   Pulse: 69   Resp: (!) 21   Temp:  36.5 C  SpO2: 100%     Last Pain:  Vitals:   05/21/20 0940  TempSrc: Tympanic  PainSc: 0-No pain                 [name]

## 2020-05-21 NOTE — H&P (Signed)
Electrophysiology Note:    Date:  05/21/2020   ID:  Autumn Wyatt, DOB [birth year], MRN [id number]  PCP:  Elayne Guerin   [place] Cardiologist:  No primary care provider on file.  [facility] Electrophysiologist:  Vickie Epley, MD   Referring MD: Blair Heys, PA-C   Chief Complaint: SVT  History of Present Illness:    Autumn Wyatt is a 54 y.o. female with a hx of SVT previously on verapamil prn who presents to clinic for further evaluation of her SVT due to a recent ER visit for SVT in the 220s. This episode was successfully treated with adenosine with a return to normal sinus rhythm. During this last episode, patient woke up from sleep with the feeling of a rapid heart rate and some shortness of breath. No chest pain. No syncope. She has had 3 episodes in the past year.   She had previously seen Dr Caryl Comes in 2011 with similar events. At that time the events were rare and seemed to be correlated to her menstrual cycle and/or hormone replacement therapy.        Past Medical History:  Diagnosis Date  . Abnormal pap   . Allergy   . DIAPHORESIS 12/27/2009   Qualifier: Diagnosis of  By: Burnett Kanaris    . Palpitations 12/27/2009   Qualifier: Diagnosis of  By: Burnett Kanaris    . Shortness of breath 12/27/2009   Qualifier: Diagnosis of  By: Burnett Kanaris    . SUPRAVENTRICULAR TACHYCARDIA 12/27/2009   Qualifier: Diagnosis of  By: Burnett Kanaris           Past Surgical History:  Procedure Laterality Date  . KNEE SURGERY    . REFRACTIVE SURGERY    . WISDOM TOOTH EXTRACTION      Current Medications: Active Medications      Current Meds  Medication Sig  . cetirizine (ZYRTEC) 10 MG tablet Take 10 mg by mouth daily.  Marland Kitchen ibuprofen (ADVIL,MOTRIN) 600 MG tablet Take 600 mg by mouth every 6 (six) hours as needed.  . Magnesium 200 MG TABS Take by mouth.  . norethindrone-ethinyl estradiol (LOESTRIN 1/20, 21,) 1-20 MG-MCG  tablet Take 1 tablet by mouth daily.       Allergies:   Codeine, Demerol [meperidine], Meperidine and related, Midazolam, and Oxycodone   Social History        Socioeconomic History  . Marital status: Married    Spouse name: Not on file  . Number of children: Not on file  . Years of education: Not on file  . Highest education level: Not on file  Occupational History  . Not on file  Tobacco Use  . Smoking status: Never Smoker  . Smokeless tobacco: Never Used  Substance and Sexual Activity  . Alcohol use: Yes    Alcohol/week: 5.0 - 10.0 standard drinks    Types: 5 - 10 Glasses of wine per week  . Drug use: No  . Sexual activity: Yes    Birth control/protection: Pill  Other Topics Concern  . Not on file  Social History Narrative  . Not on file   Social Determinants of Health      Financial Resource Strain:   . Difficulty of Paying Living Expenses: Not on file  Food Insecurity:   . Worried About Charity fundraiser in the Last Year: Not on file  . Ran Out of Food in the Last Year: Not on file  Transportation Needs:   . Lack  of Transportation (Medical): Not on file  . Lack of Transportation (Non-Medical): Not on file  Physical Activity:   . Days of Exercise per Week: Not on file  . Minutes of Exercise per Session: Not on file  Stress:   . Feeling of Stress : Not on file  Social Connections:   . Frequency of Communication with Friends and Family: Not on file  . Frequency of Social Gatherings with Friends and Family: Not on file  . Attends Religious Services: Not on file  . Active Member of Clubs or Organizations: Not on file  . Attends Archivist Meetings: Not on file  . Marital Status: Not on file     Family History: The patient's family history includes Breast cancer (age of onset: 71) in her paternal aunt; Chronic granulomatous disease in her father and mother; Diabetes in her paternal grandmother; Heart disease in her paternal  grandmother.  ROS:   Please see the history of present illness.    All other systems reviewed and are negative.  EKGs/Labs/Other Studies Reviewed:    The following studies were reviewed today: ECG  ECG on 02/13/2020 showed a short RP, narrow QRS, tachycardia with a ventricular rate of 242.   EKG:  The ekg ordered today demonstrates sinus rhythm. No preexcitation.  Recent Labs: No results found for requested labs within last 8760 hours.  Recent Lipid Panel Labs (Brief)  No results found for: CHOL, TRIG, HDL, CHOLHDL, VLDL, LDLCALC, LDLDIRECT    Physical Exam:    VS:  BP 136/86   Pulse 70   Ht 4\' 11"  (1.499 m)   Wt 123 lb 12.8 oz (56.2 kg)   SpO2 98%   BMI 25.00 kg/m        Wt Readings from Last 3 Encounters:  04/12/20 123 lb 12.8 oz (56.2 kg)  06/05/12 107 lb (48.5 kg)     GEN: Well nourished, well developed in no acute distress HEENT: Normal NECK: No JVD; No carotid bruits LYMPHATICS: No lymphadenopathy CARDIAC: RRR, no murmurs, rubs, gallops RESPIRATORY:  Clear to auscultation without rales, wheezing or rhonchi  ABDOMEN: Soft, non-tender, non-distended MUSCULOSKELETAL:  No edema; No deformity  SKIN: Warm and dry NEUROLOGIC:  Alert and oriented x 3 PSYCHIATRIC:  Normal affect   ASSESSMENT:    1. SVT (supraventricular tachycardia) (HCC)    PLAN:    In order of problems listed above:  1. SVT Patient has a history of adenosine sensitive SVT. ECG from 02/13/2020 at [facility] show narrow QRS tach. Recommend we proceed with EP study and ablation for definitive management. Will plan to update the echo prior to the procedure.       ------------------------------------------------------------------------------------ I have seen, examined the patient. Plan for EP study and ablation of SVT.  Vickie Epley, MD 05/21/2020 7:16 AM

## 2020-05-21 NOTE — Anesthesia Preprocedure Evaluation (Signed)
Anesthesia Evaluation  Patient identified by MRN, date of birth, ID band Patient awake    Reviewed: Allergy & Precautions, NPO status , Patient's Chart, lab work & pertinent test results  Airway Mallampati: II  TM Distance: >3 FB Neck ROM: Full    Dental no notable dental hx.    Pulmonary neg pulmonary ROS,    Pulmonary exam normal breath sounds clear to auscultation       Cardiovascular Normal cardiovascular exam+ dysrhythmias Supra Ventricular Tachycardia  Rhythm:Regular Rate:Normal     Neuro/Psych negative neurological ROS  negative psych ROS   GI/Hepatic negative GI ROS, Neg liver ROS,   Endo/Other  negative endocrine ROS  Renal/GU negative Renal ROS  negative genitourinary   Musculoskeletal negative musculoskeletal ROS (+)   Abdominal   Peds negative pediatric ROS (+)  Hematology   Anesthesia Other Findings   Reproductive/Obstetrics negative OB ROS                             Anesthesia Physical Anesthesia Plan  ASA: II  Anesthesia Plan: MAC   Post-op Pain Management:    Induction:   PONV Risk Score and Plan: Propofol infusion, TIVA and Treatment may vary due to age or medical condition  Airway Management Planned: Natural Airway and Nasal Cannula  Additional Equipment: None  Intra-op Plan:   Post-operative Plan:   Informed Consent: I have reviewed the patients History and Physical, chart, labs and discussed the procedure including the risks, benefits and alternatives for the proposed anesthesia with the patient or authorized representative who has indicated his/her understanding and acceptance.     Dental advisory given  Plan Discussed with: CRNA  Anesthesia Plan Comments:         Anesthesia Quick Evaluation

## 2020-05-21 NOTE — Transfer of Care (Signed)
Immediate Anesthesia Transfer of Care Note  Patient: Autumn Wyatt  Procedure(s) Performed: SVT ABLATION (N/A )  Patient Location: PACU  Anesthesia Type:MAC  Level of Consciousness: awake and alert   Airway & Oxygen Therapy: Patient Spontanous Breathing  Post-op Assessment: Report given to RN and Post -op Vital signs reviewed and stable  Post vital signs: Reviewed and stable  Last Vitals:  Vitals Value Taken Time  BP 103/70 05/21/20 0939  Temp    Pulse 67 05/21/20 0940  Resp 21 05/21/20 0940  SpO2 97 % 05/21/20 0940  Vitals shown include unvalidated device data.  Last Pain:  Vitals:   05/21/20 2831  TempSrc:   PainSc: 2          Complications: No complications documented.

## 2020-05-24 MED FILL — Heparin Sodium (Porcine) Inj 1000 Unit/ML: INTRAMUSCULAR | Qty: 10 | Status: AC

## 2020-06-23 ENCOUNTER — Encounter: Payer: Self-pay | Admitting: Cardiology

## 2020-06-23 ENCOUNTER — Ambulatory Visit (INDEPENDENT_AMBULATORY_CARE_PROVIDER_SITE_OTHER): Payer: 59 | Admitting: Cardiology

## 2020-06-23 ENCOUNTER — Other Ambulatory Visit: Payer: Self-pay

## 2020-06-23 VITALS — BP 120/78 | HR 68 | Ht <= 58 in | Wt 127.0 lb

## 2020-06-23 DIAGNOSIS — I471 Supraventricular tachycardia: Secondary | ICD-10-CM

## 2020-06-23 NOTE — Patient Instructions (Addendum)

## 2020-06-23 NOTE — Progress Notes (Signed)
Electrophysiology Office Follow up Visit Note:    Date:  06/23/2020   ID:  Autumn Wyatt, DOB 07-21-1966, MRN [id number]  PCP:  Blair Heys, PA-C  [facility] Cardiologist:  Vickie Epley, MD  [facility] Electrophysiologist:  Vickie Epley, MD    Interval History:    Autumn Wyatt is a 54 y.o. female who presents for a follow up visit after an SVT ablation on May 21, 2020.  During the EP study, a narrow complex tachycardia was induced consistent with AV nodal reentrant tachycardia.  Ablation was performed in the slow pathway region successfully abolishing the tachycardia.  Attempts at reinduction with and without isoproterenol were unable to reduce the clinical tachycardia.  Since the procedure, the patient has been doing quite well.  She underwent carpal tunnel release surgery which went well.  She is recuperating from that.  No further episodes of SVT.  She has been off of all nodal blockers.     Past Medical History:  Diagnosis Date  . Abnormal pap   . Allergy   . DIAPHORESIS 12/27/2009   Qualifier: Diagnosis of  By: Burnett Kanaris    . Palpitations 12/27/2009   Qualifier: Diagnosis of  By: Burnett Kanaris    . Shortness of breath 12/27/2009   Qualifier: Diagnosis of  By: Burnett Kanaris    . SUPRAVENTRICULAR TACHYCARDIA 12/27/2009   Qualifier: Diagnosis of  By: Burnett Kanaris      Past Surgical History:  Procedure Laterality Date  . KNEE SURGERY    . REFRACTIVE SURGERY    . SVT ABLATION N/A 05/21/2020   Procedure: SVT ABLATION;  Surgeon: Vickie Epley, MD;  Location: [place] [facility];  Service: Cardiovascular;  Laterality: N/A;  . WISDOM TOOTH EXTRACTION      Current Medications: Current Meds  Medication Sig  . acetaminophen (TYLENOL) 500 MG tablet Take 500 mg by mouth every 6 (six) hours as needed for mild pain or moderate pain.  Marland Kitchen ibuprofen (ADVIL,MOTRIN) 600 MG tablet Take 400-600 mg by mouth every 6 (six) hours as needed for  headache.   . Multiple Vitamins-Minerals (EMERGEN-C IMMUNE PLUS PO) Take 1 tablet by mouth daily as needed. Vitamin D3  . norethindrone-ethinyl estradiol (LOESTRIN 1/20, 21,) 1-20 MG-MCG tablet Take 1 tablet by mouth daily.  . [DISCONTINUED] norethindrone-ethinyl estradiol (LOESTRIN FE) 1-20 MG-MCG tablet Take 1 tablet by mouth daily.     Allergies:   Codeine, Demerol [meperidine], Midazolam, and Oxycodone   Social History   Socioeconomic History  . Marital status: Married    Spouse name: Not on file  . Number of children: Not on file  . Years of education: Not on file  . Highest education level: Not on file  Occupational History  . Not on file  Tobacco Use  . Smoking status: Never Smoker  . Smokeless tobacco: Never Used  Substance and Sexual Activity  . Alcohol use: Yes    Alcohol/week: 5.0 - 10.0 standard drinks    Types: 5 - 10 Glasses of wine per week  . Drug use: No  . Sexual activity: Yes    Birth control/protection: Pill  Other Topics Concern  . Not on file  Social History Narrative  . Not on file   Social Determinants of Health   Financial Resource Strain:   . Difficulty of Paying Living Expenses: Not on file  Food Insecurity:   . Worried About Charity fundraiser in the Last Year: Not on file  .  Ran Out of Food in the Last Year: Not on file  Transportation Needs:   . Lack of Transportation (Medical): Not on file  . Lack of Transportation (Non-Medical): Not on file  Physical Activity:   . Days of Exercise per Week: Not on file  . Minutes of Exercise per Session: Not on file  Stress:   . Feeling of Stress : Not on file  Social Connections:   . Frequency of Communication with Friends and Family: Not on file  . Frequency of Social Gatherings with Friends and Family: Not on file  . Attends Religious Services: Not on file  . Active Member of Clubs or Organizations: Not on file  . Attends Archivist Meetings: Not on file  . Marital Status: Not on file      Family History: The patient's family history includes Breast cancer (age of onset: 72) in her paternal aunt; Chronic granulomatous disease in her father and mother; Diabetes in her paternal grandmother; Heart disease in her paternal grandmother.  ROS:   Please see the history of present illness.    All other systems reviewed and are negative.  EKGs/Labs/Other Studies Reviewed:    The following studies were reviewed today: Prior notes  EKG:  The ekg ordered today demonstrates sinus rhythm  Recent Labs: 05/19/2020: BUN 11; Creatinine, Ser 0.53; Hemoglobin 14.7; Platelets 210; Potassium 3.8; Sodium 138  Recent Lipid Panel No results found for: CHOL, TRIG, HDL, CHOLHDL, VLDL, LDLCALC, LDLDIRECT  Physical Exam:    VS:  BP 120/78   Pulse 68   Ht 4\' 10"  (1.473 m)   Wt 127 lb (57.6 kg)   SpO2 98%   BMI 26.54 kg/m     Wt Readings from Last 3 Encounters:  06/23/20 127 lb (57.6 kg)  05/21/20 120 lb (54.4 kg)  04/12/20 123 lb 12.8 oz (56.2 kg)     GEN:  Well nourished, well developed in no acute distress HEENT: Normal NECK: No JVD; No carotid bruits LYMPHATICS: No lymphadenopathy CARDIAC: RRR, no murmurs, rubs, gallops.  Groin access sites on the right side x3 are well-healed without hematoma.   RESPIRATORY:  Clear to auscultation without rales, wheezing or rhonchi  ABDOMEN: Soft, non-tender, non-distended MUSCULOSKELETAL:  No edema SKIN: Warm and dry NEUROLOGIC:  Alert and oriented x 3 PSYCHIATRIC:  Normal affect   ASSESSMENT:    1. SVT (supraventricular tachycardia) (HCC)    PLAN:    In order of problems listed above:  1. Doing well after EP study and ablation.  No further episodes of AV nodal reentrant tachycardia.  Plan to follow-up as needed.  No indication for calcium channel blocker or beta blockers at this time.   Medication Adjustments/Labs and Tests Ordered: Current medicines are reviewed at length with the patient today.  Concerns regarding medicines  are outlined above.  Orders Placed This Encounter  Procedures  . EKG 12-Lead   No orders of the defined types were placed in this encounter.    Signed, Lars Mage, MD, [facility]  06/23/2020 7:31 PM    Electrophysiology [place]

## 2021-11-21 ENCOUNTER — Other Ambulatory Visit: Payer: Self-pay | Admitting: Obstetrics and Gynecology

## 2021-11-23 ENCOUNTER — Other Ambulatory Visit: Payer: Self-pay | Admitting: Obstetrics and Gynecology

## 2021-11-23 DIAGNOSIS — R928 Other abnormal and inconclusive findings on diagnostic imaging of breast: Secondary | ICD-10-CM

## 2021-12-13 ENCOUNTER — Ambulatory Visit
Admission: RE | Admit: 2021-12-13 | Discharge: 2021-12-13 | Disposition: A | Discharge status: Home / Self Care | Payer: 59 | Source: Ambulatory Visit | Attending: Obstetrics and Gynecology | Admitting: Obstetrics and Gynecology

## 2021-12-13 ENCOUNTER — Other Ambulatory Visit: Payer: Self-pay | Admitting: Obstetrics and Gynecology

## 2021-12-13 DIAGNOSIS — R928 Other abnormal and inconclusive findings on diagnostic imaging of breast: Secondary | ICD-10-CM

## 2021-12-13 DIAGNOSIS — N6320 Unspecified lump in the left breast, unspecified quadrant: Secondary | ICD-10-CM

## 2021-12-13 IMAGING — MG MM DIGITAL DIAGNOSTIC UNILAT*L* W/ TOMO W/ CAD
6 series · 6 of 18 positions shown · non-contrast
Comparison: Prior studies

CLINICAL DATA: 55-year-old female for further evaluation of
possible LEFT breast distortion on screening mammogram.

EXAM:
DIGITAL DIAGNOSTIC UNILATERAL LEFT MAMMOGRAM WITH TOMOSYNTHESIS AND
CAD; ULTRASOUND LEFT BREAST LIMITED
TECHNIQUE: Left digital diagnostic mammography and breast tomosynthesis was
performed. The images were evaluated with computer-aided detection.;
Targeted ultrasound examination of the left breast was performed.

[L MLO synth-2D]
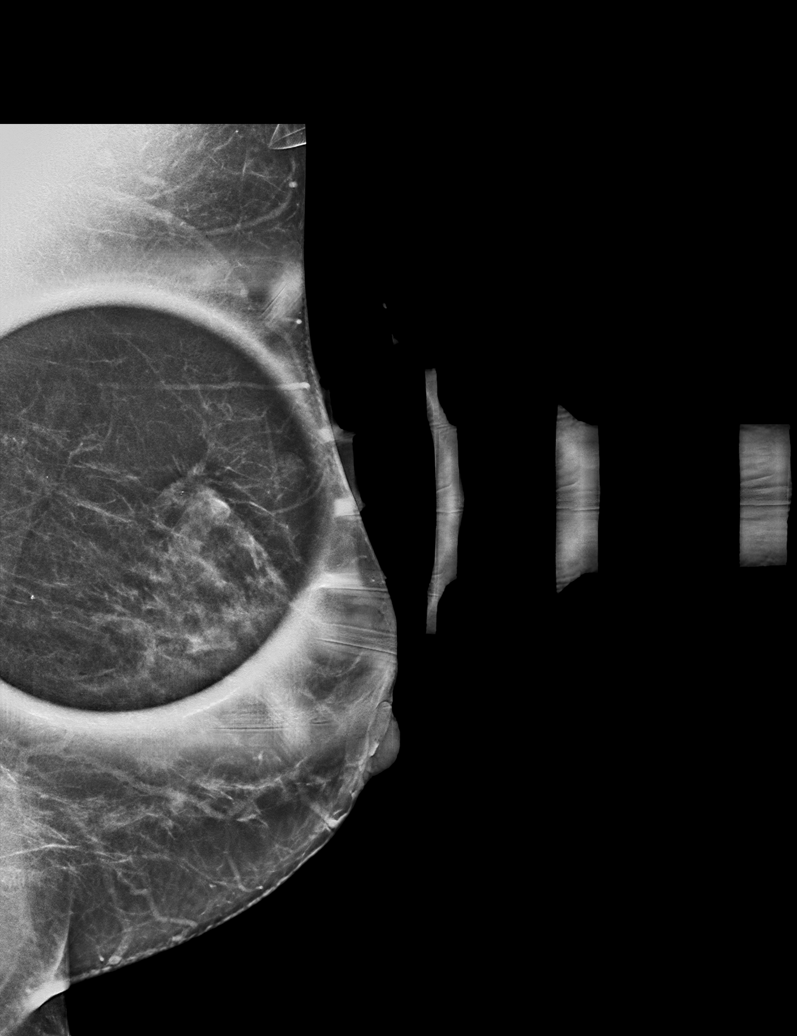

[L ML synth-2D]
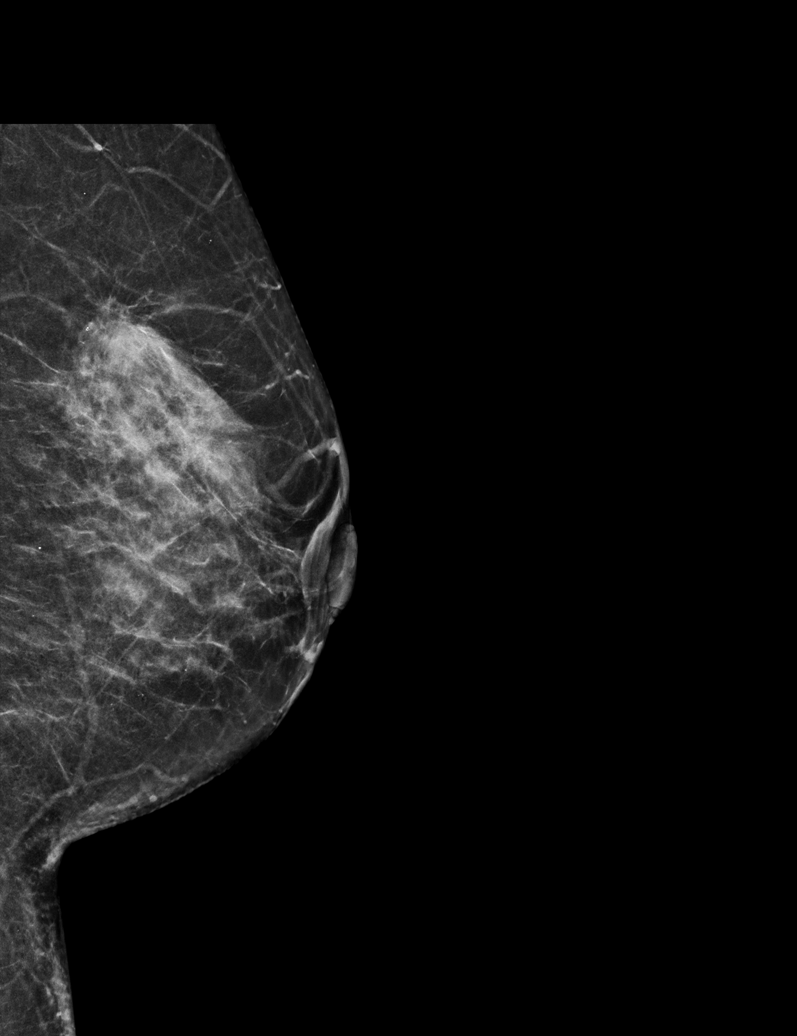

[L CC synth-2D]
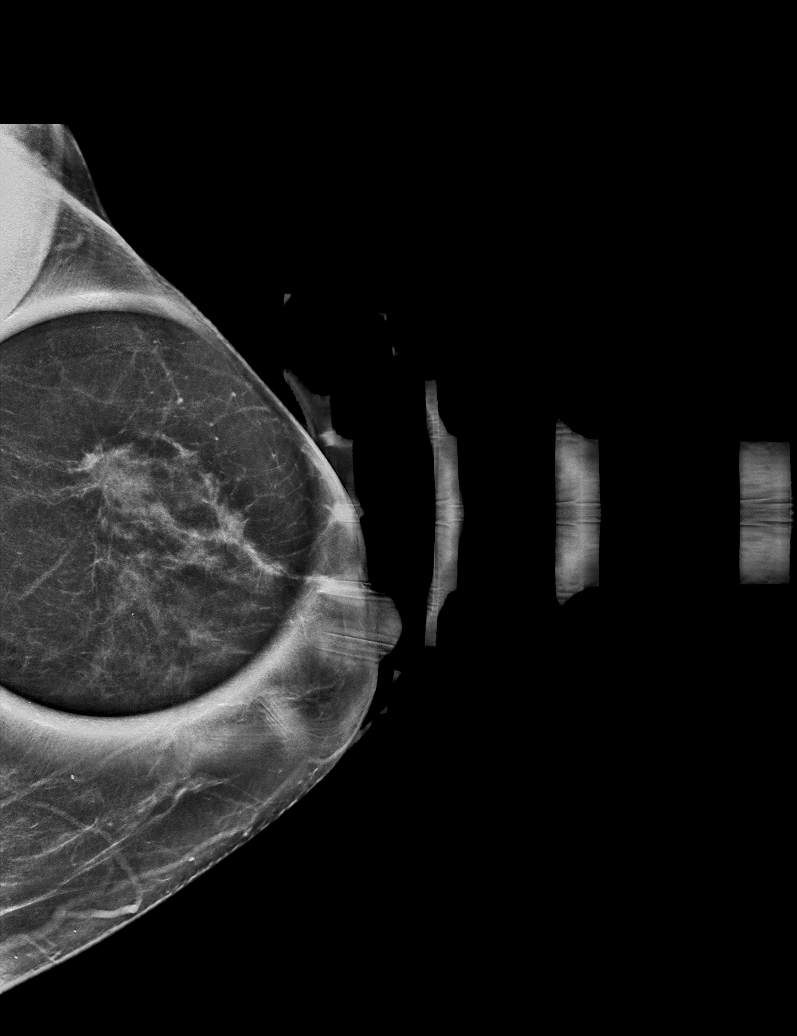

[L MLO tomo · tomo slice 30/59.0]
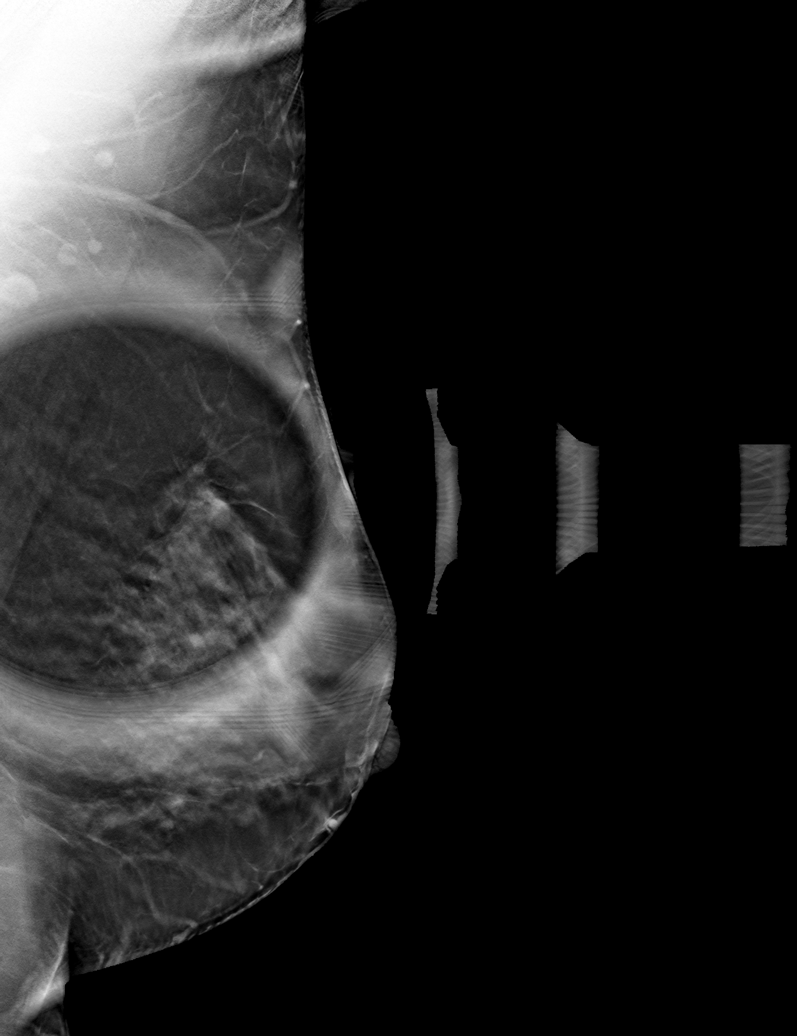

[L CC tomo · tomo slice 31/60.0]
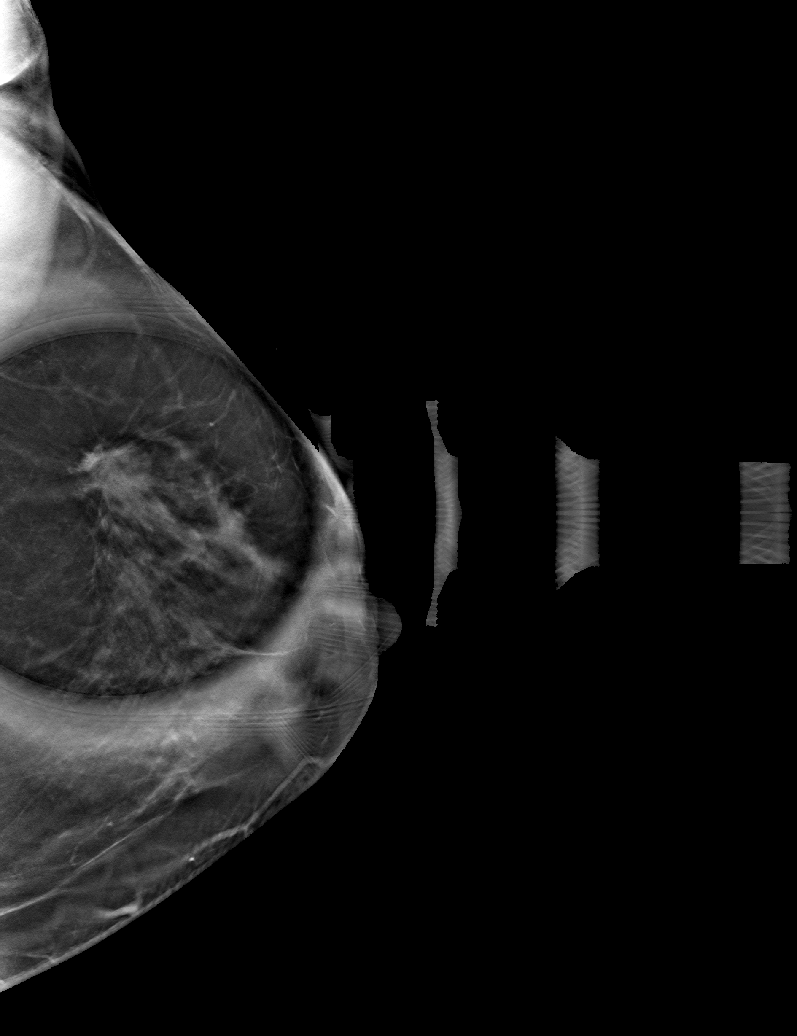

[L ML tomo · tomo slice 31/60.0]
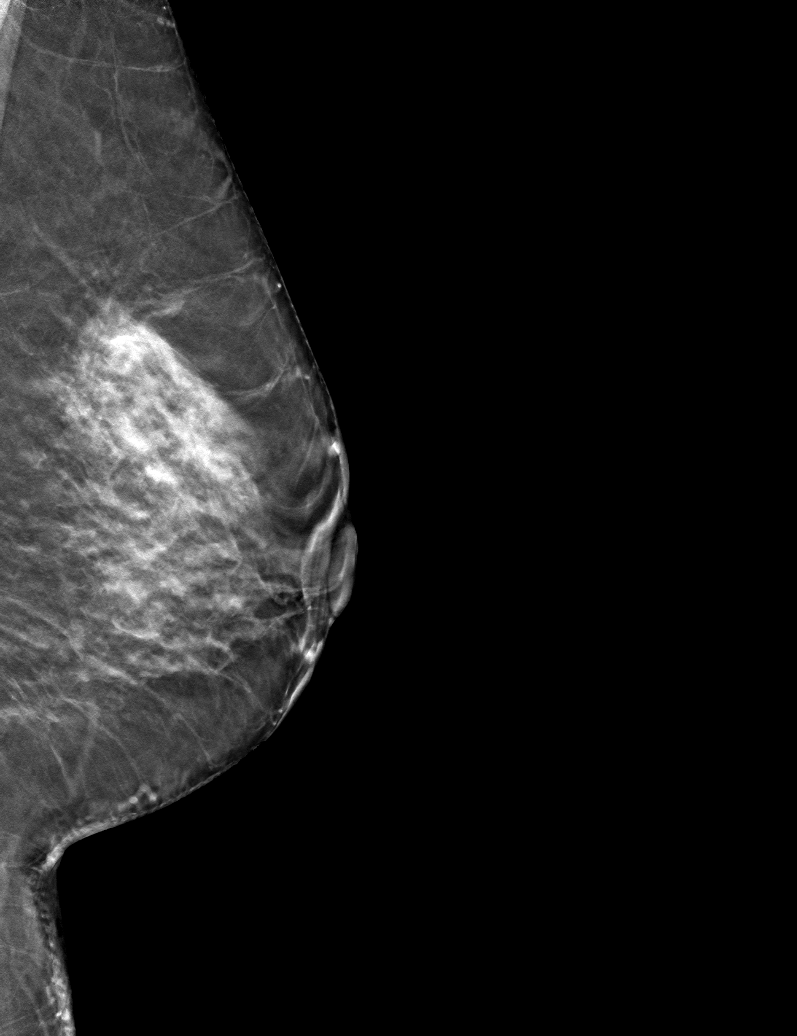

[6 of 18 positions shown; findings below may reference images not displayed]

ACR Breast Density Category c: The breast tissue is heterogeneously
dense, which may obscure small masses.
FINDINGS: 2D/3D spot compression views of the LEFT breast demonstrate a
persistent irregular mass within the UPPER-OUTER LEFT breast at the
site of the screening study finding.

Targeted ultrasound is performed, showing a 1.1 x 1 x 0.8 cm
irregular hypoechoic mass at the 2 o'clock position of the LEFT
breast 5 cm from the nipple, corresponding to the screening study
finding.

No abnormal LEFT axillary lymph nodes are noted.
IMPRESSION: 1. Highly suspicious 1.1 cm UPPER-OUTER LEFT breast mass. Tissue
sampling is recommended.
2. No abnormal appearing LEFT axillary lymph nodes.

RECOMMENDATION:
Ultrasound-guided LEFT breast biopsy, which will be scheduled.

I have discussed the findings and recommendations with the patient.
If applicable, a reminder letter will be sent to the patient
regarding the next appointment.

BI-RADS CATEGORY  5: Highly suggestive of malignancy.

## 2021-12-13 IMAGING — US US BREAST*L* LIMITED INC AXILLA
1 series · 9 of 9 positions shown · non-contrast
Comparison: Prior studies

CLINICAL DATA: 55-year-old female for further evaluation of
possible LEFT breast distortion on screening mammogram.

EXAM:
DIGITAL DIAGNOSTIC UNILATERAL LEFT MAMMOGRAM WITH TOMOSYNTHESIS AND
CAD; ULTRASOUND LEFT BREAST LIMITED
TECHNIQUE: Left digital diagnostic mammography and breast tomosynthesis was
performed. The images were evaluated with computer-aided detection.;
Targeted ultrasound examination of the left breast was performed.

[Series 1: us breast*left* limited inc axilla · 0.06mm/px · 9 of 9 slices shown]
[im 1/9]
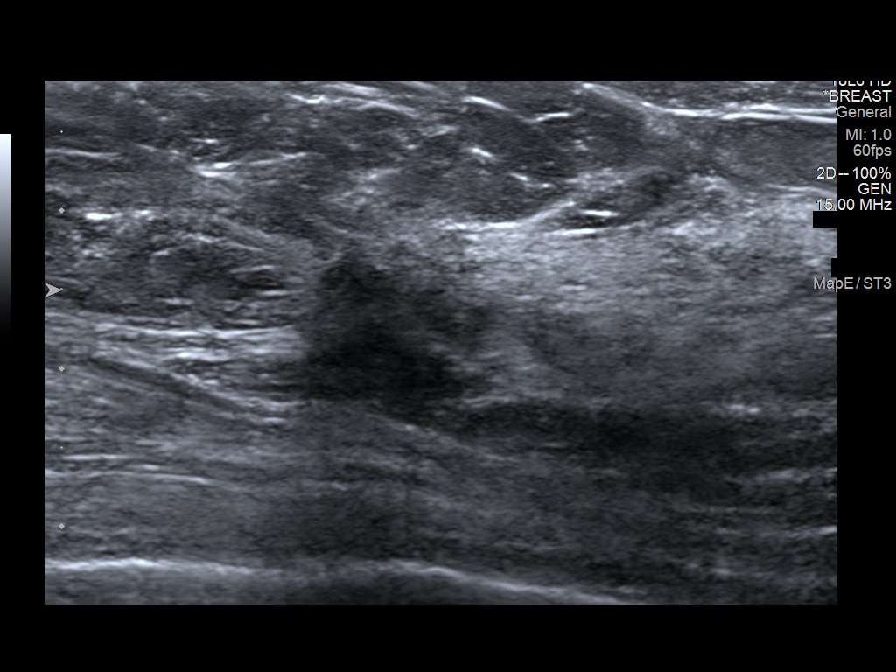
[im 2/9]
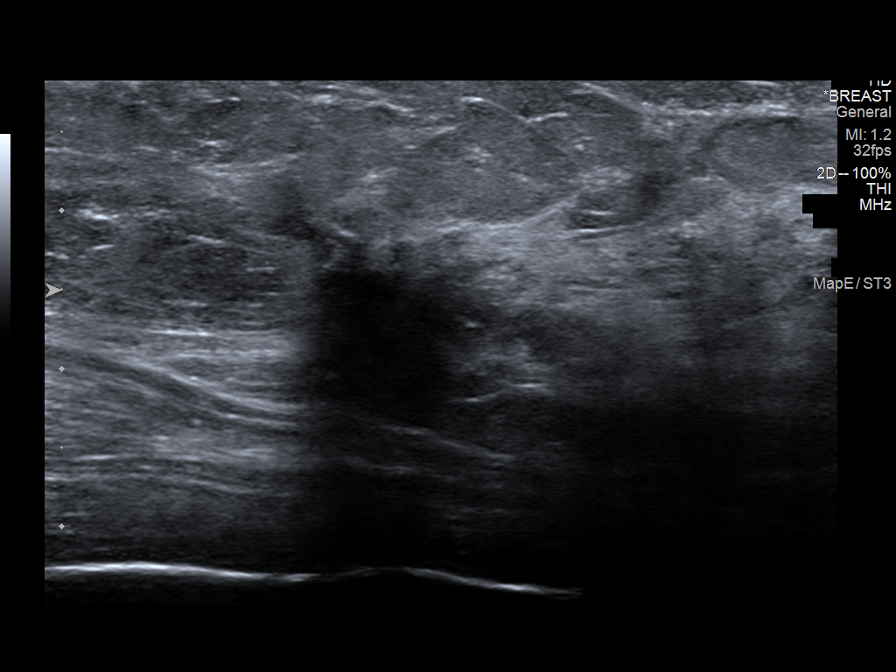
[im 3/9]
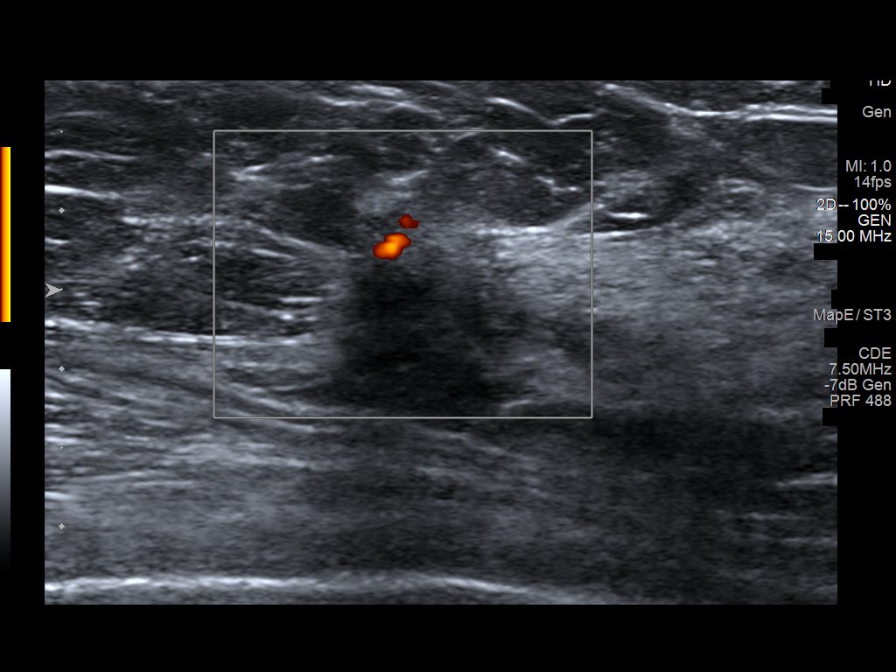
[im 4/9]
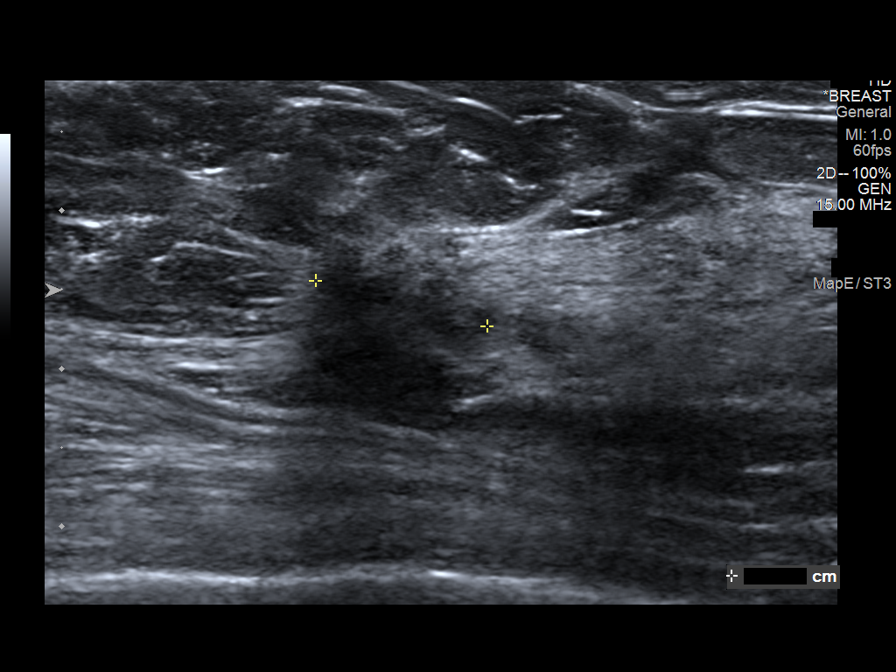
[im 5/9]
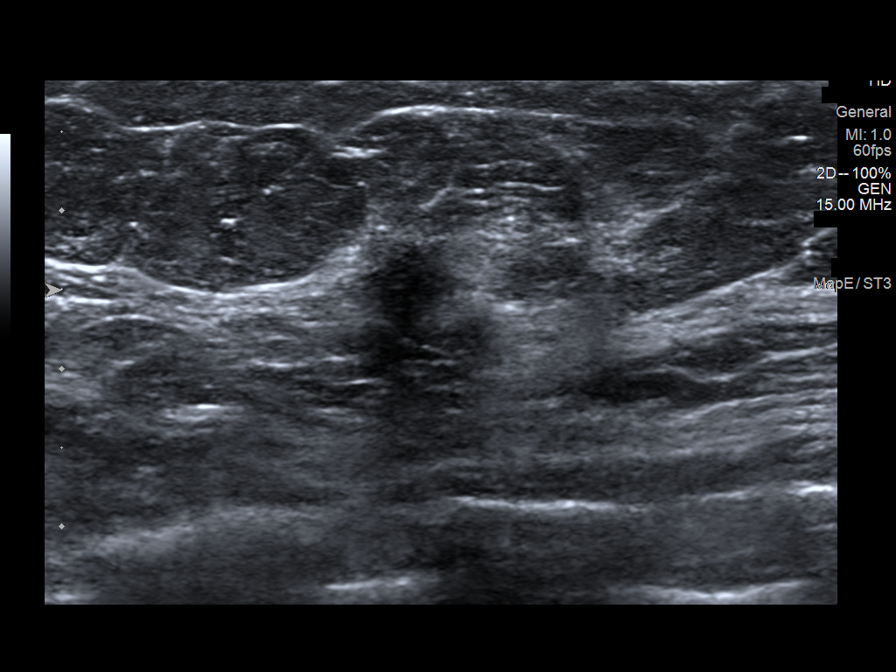
[im 6/9]
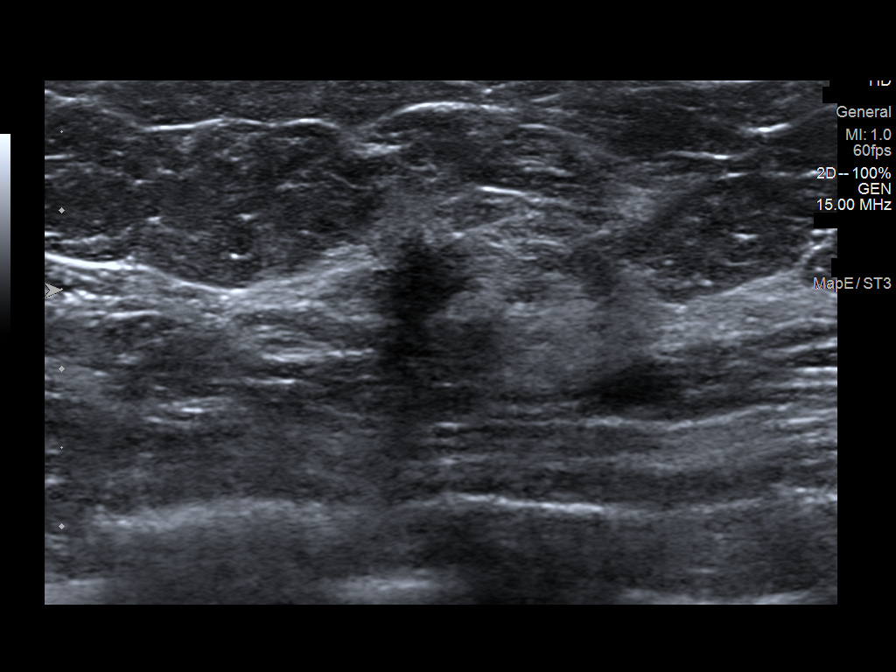
[im 7/9]
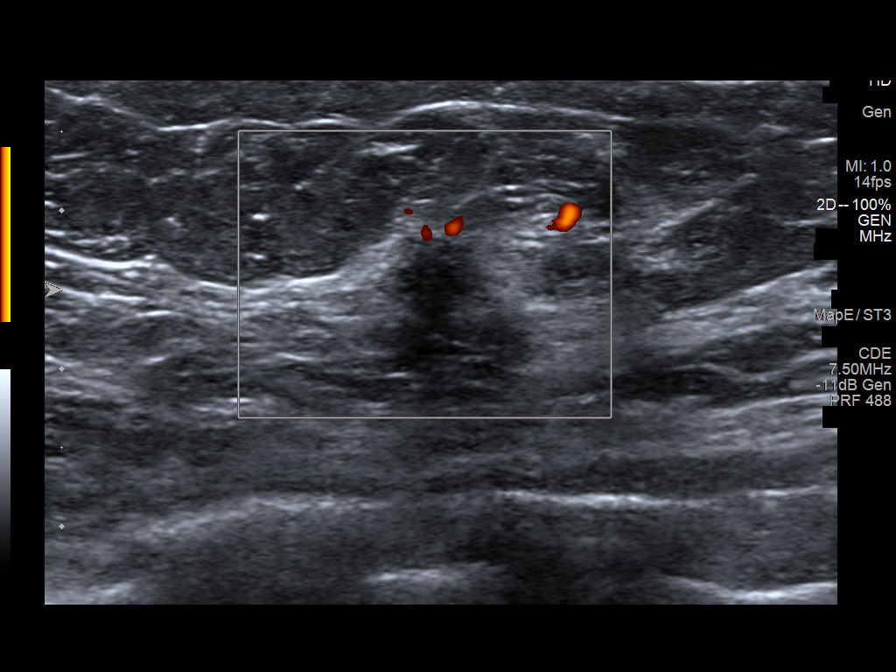
[im 8/9]
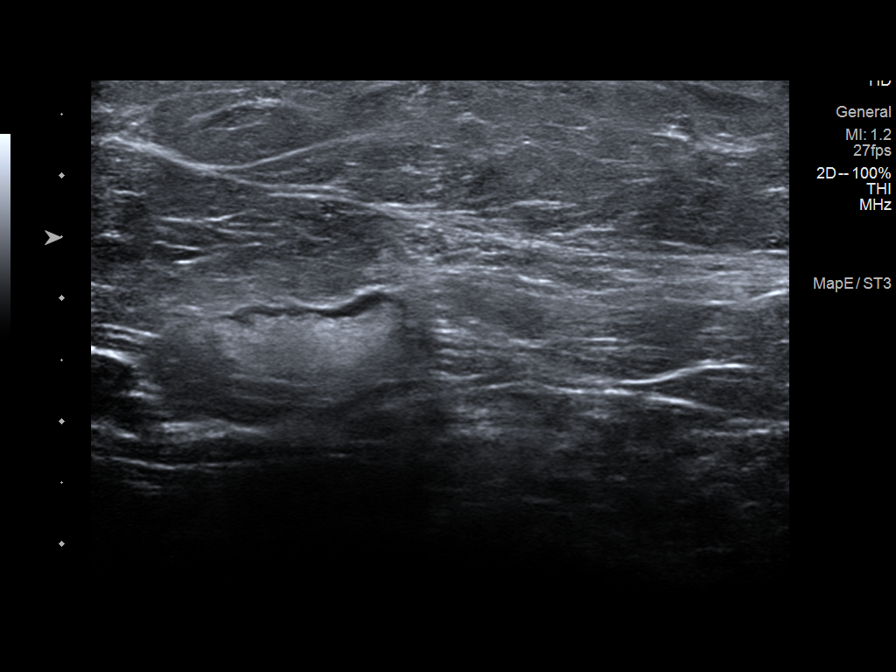
[im 9/9]
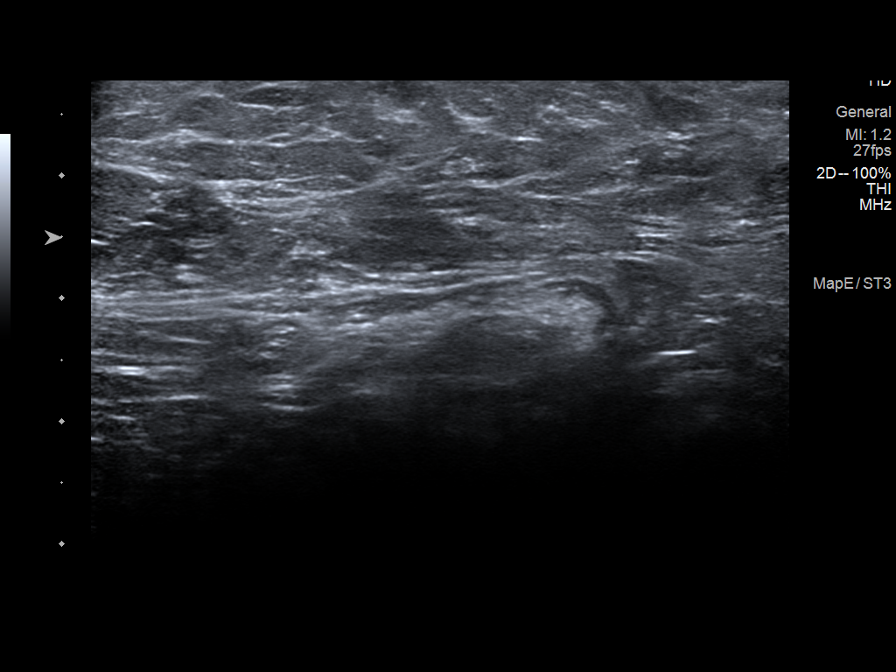

[9 of 9 positions shown; findings below may reference images not displayed]

ACR Breast Density Category c: The breast tissue is heterogeneously
dense, which may obscure small masses.
FINDINGS: 2D/3D spot compression views of the LEFT breast demonstrate a
persistent irregular mass within the UPPER-OUTER LEFT breast at the
site of the screening study finding.

Targeted ultrasound is performed, showing a 1.1 x 1 x 0.8 cm
irregular hypoechoic mass at the 2 o'clock position of the LEFT
breast 5 cm from the nipple, corresponding to the screening study
finding.

No abnormal LEFT axillary lymph nodes are noted.
IMPRESSION: 1. Highly suspicious 1.1 cm UPPER-OUTER LEFT breast mass. Tissue
sampling is recommended.
2. No abnormal appearing LEFT axillary lymph nodes.

RECOMMENDATION:
Ultrasound-guided LEFT breast biopsy, which will be scheduled.

I have discussed the findings and recommendations with the patient.
If applicable, a reminder letter will be sent to the patient
regarding the next appointment.

BI-RADS CATEGORY  5: Highly suggestive of malignancy.

## 2021-12-14 ENCOUNTER — Ambulatory Visit
Admission: RE | Admit: 2021-12-14 | Discharge: 2021-12-14 | Disposition: A | Discharge status: Home / Self Care | Payer: 59 | Source: Ambulatory Visit | Attending: Obstetrics and Gynecology | Admitting: Obstetrics and Gynecology

## 2021-12-14 DIAGNOSIS — N6320 Unspecified lump in the left breast, unspecified quadrant: Secondary | ICD-10-CM

## 2021-12-14 DIAGNOSIS — C50919 Malignant neoplasm of unspecified site of unspecified female breast: Secondary | ICD-10-CM

## 2021-12-14 HISTORY — DX: Malignant neoplasm of unspecified site of unspecified female breast: C50.919

## 2021-12-14 HISTORY — PX: BREAST BIOPSY: SHX20

## 2021-12-14 IMAGING — US US BREAST BX W LOC DEV 1ST LESION IMG BX SPEC US GUIDE*L*
1 series · 13 of 14 positions shown · non-contrast
Comparison: None Available.
COMPARISON: None Available.

Addendum:
CLINICAL DATA: 55-year-old female presenting for biopsy of a mass
in the left breast.

EXAM:
ULTRASOUND GUIDED LEFT BREAST CORE NEEDLE BIOPSY

[Series 1: us breast bx w loc dev 1st lesion img bx spec us g · 0.07mm/px · 13 of 14 slices shown]
[im 1/14]
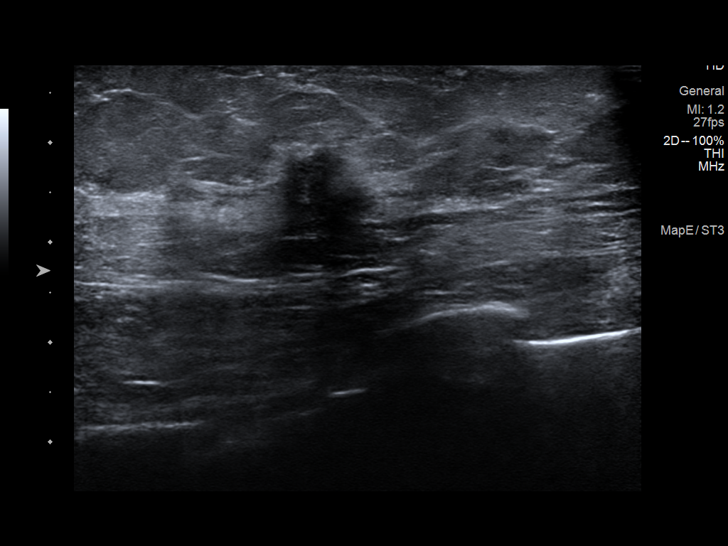
[im 2/14]
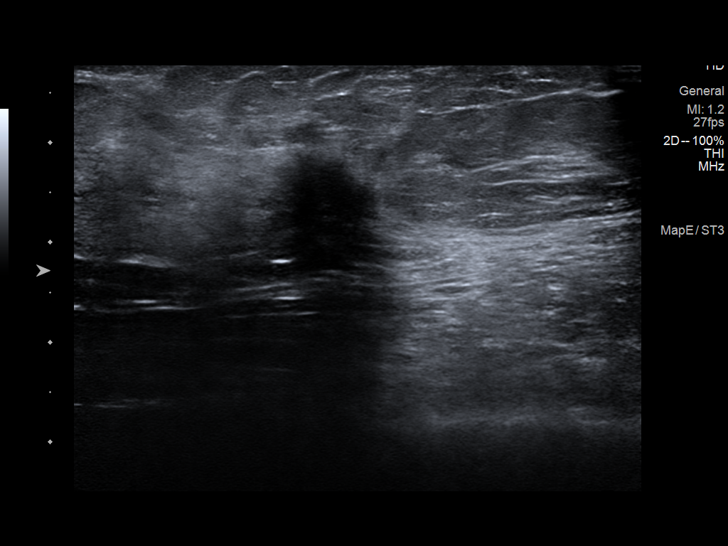
[im 3/14]
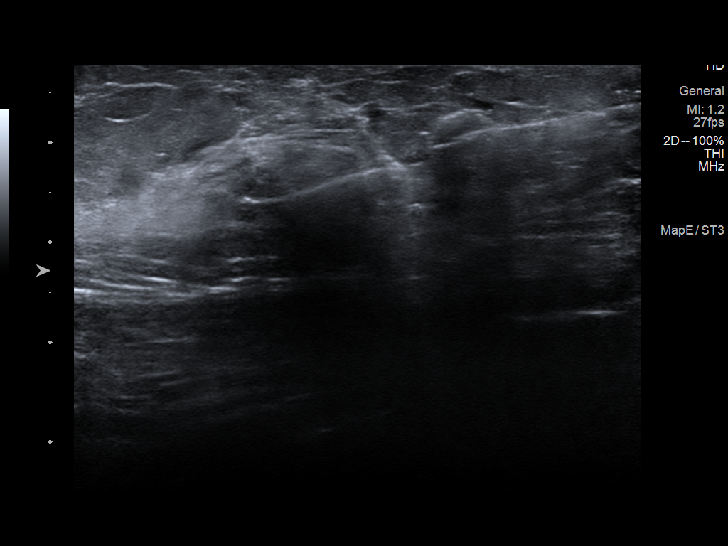
[im 4/14]
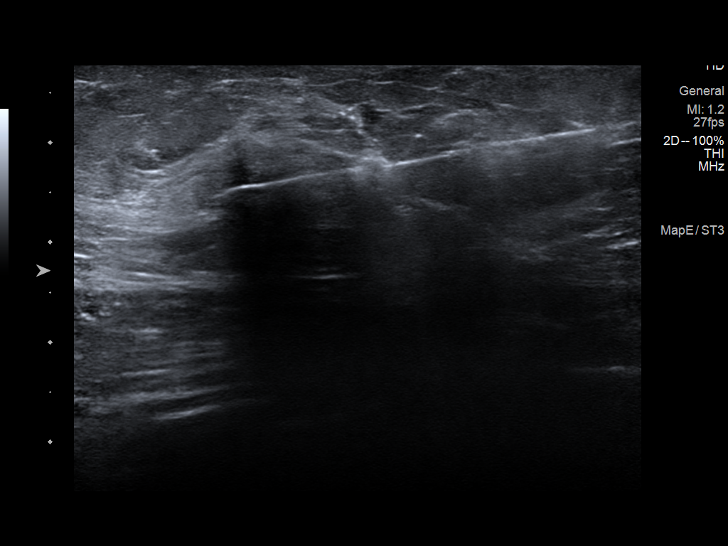
[im 5/14]
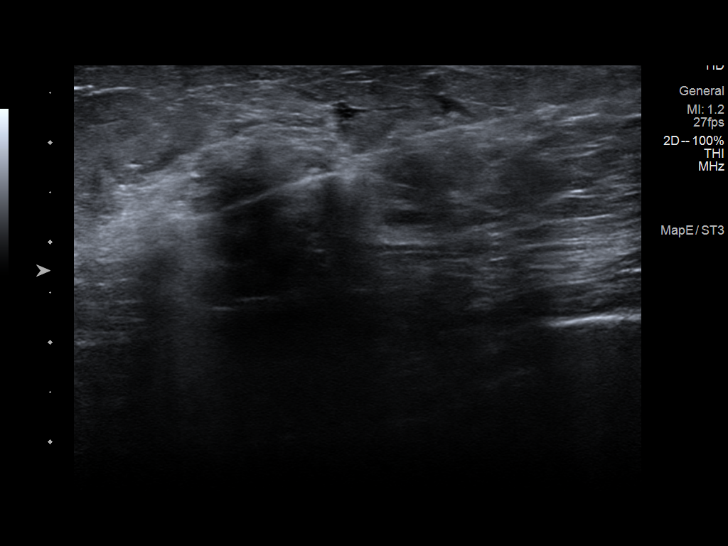
[im 6/14]
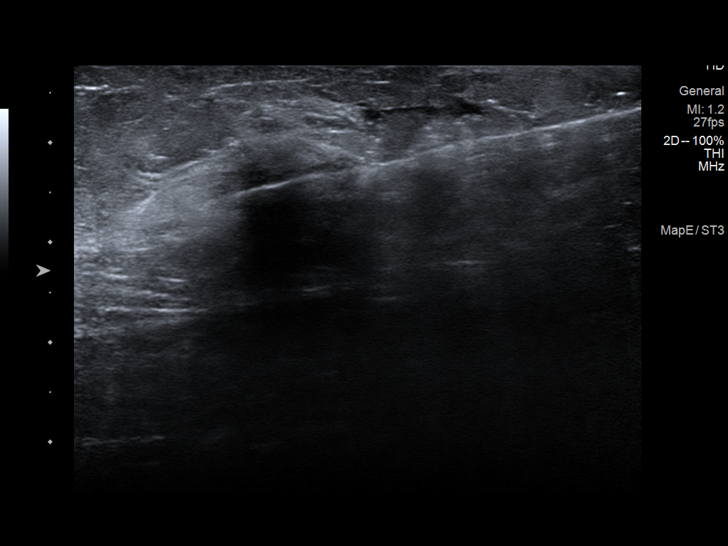
[im 8/14]
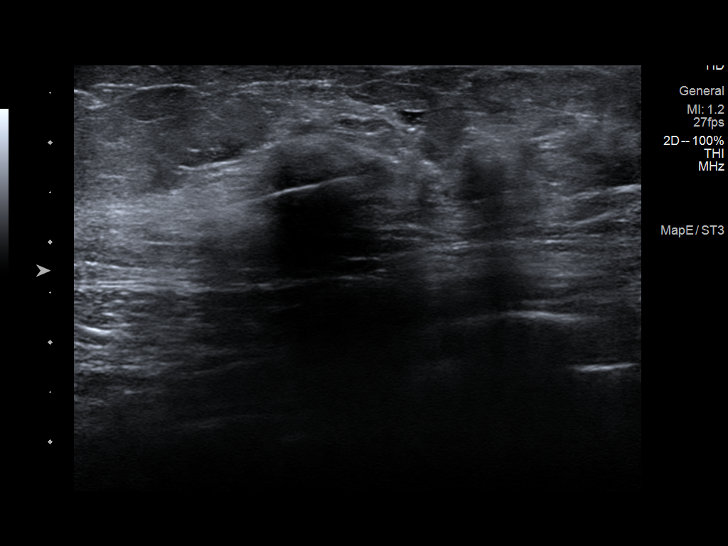
[im 9/14]
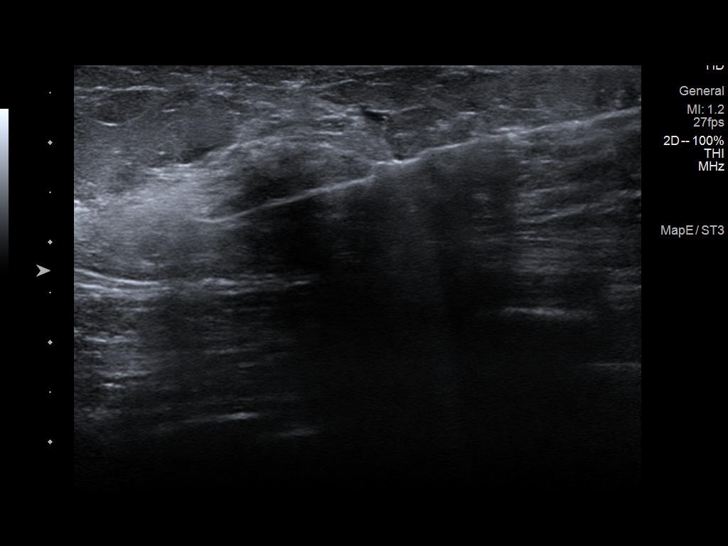
[im 10/14]
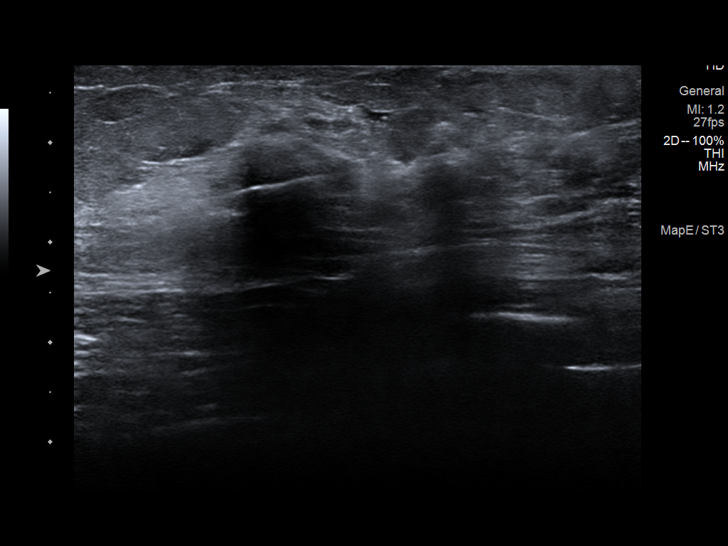
[im 11/14]
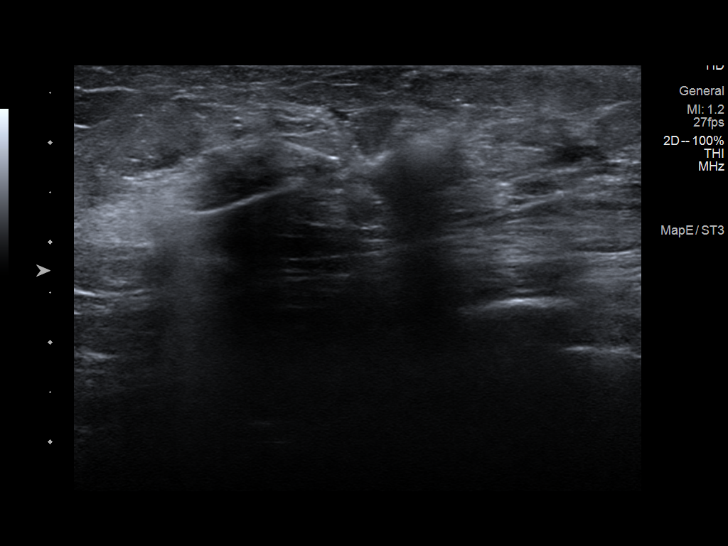
[im 12/14]
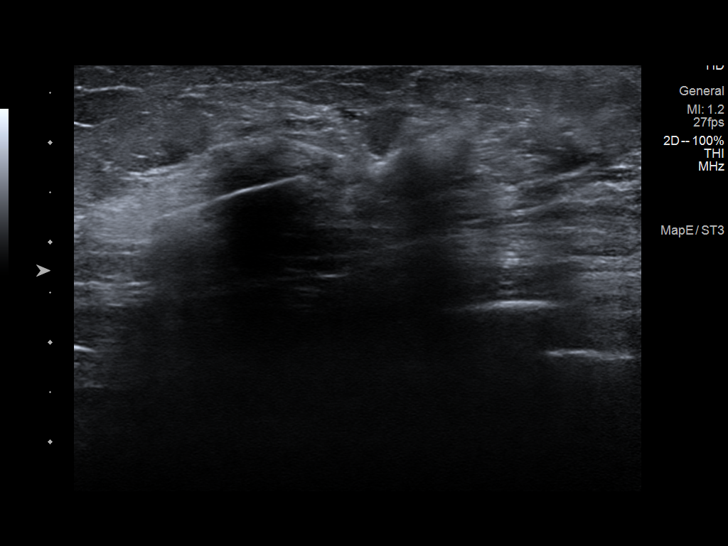
[im 13/14]
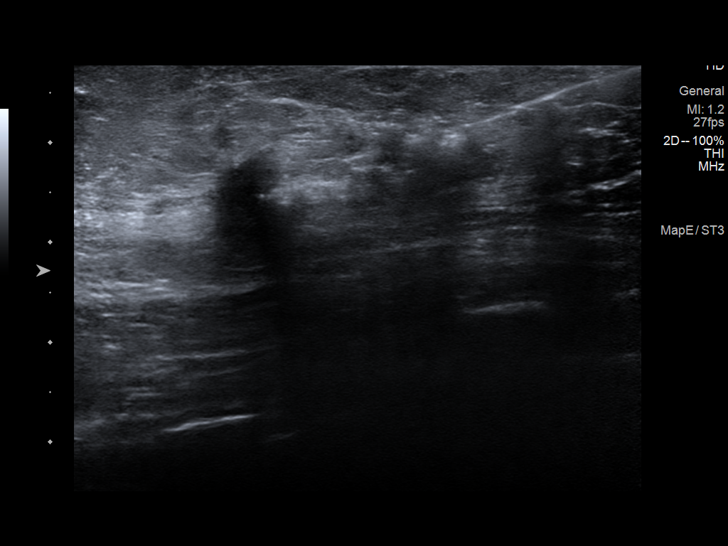
[im 14/14]
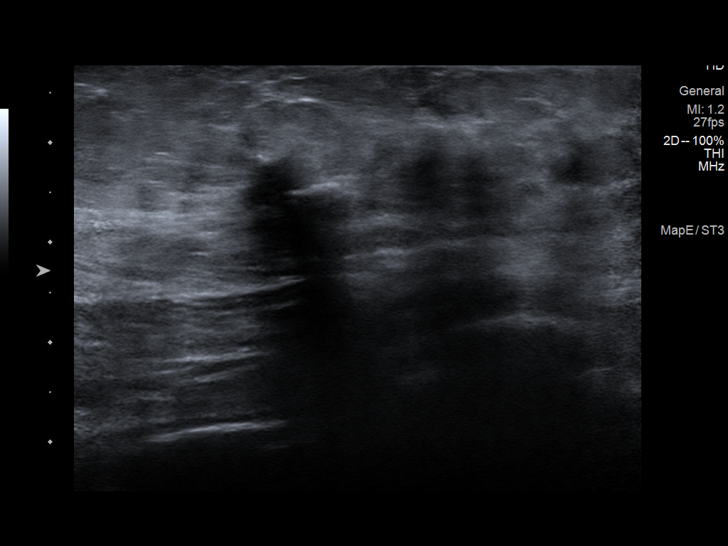

[13 of 14 positions shown; findings below may reference images not displayed]



Lesion quadrant: Upper outer quadrant

Using sterile technique and 1% Lidocaine as local anesthetic, under
direct ultrasound visualization, a 14 gauge BOURDEAU device was
used to perform biopsy of mass in the left breast at 2 o'clock using
a lateral approach. At the conclusion of the procedure a ribbon
tissue marker clip was deployed into the biopsy cavity. Follow up 2
view mammogram was performed and dictated separately.
IMPRESSION: Ultrasound guided biopsy of a mass in the left breast at 2 o'clock.
No apparent complications.

ADDENDUM:
Pathology revealed GRADE I-II INVASIVE MAMMARY CARCINOMA, MAMMARY
CARCINOMA IN SITU of the LEFT breast, 2:00 o'clock, [LV], (ribbon
clip). This was found to be concordant by Dr. BOURDEAU.

Pathology results were discussed with the patient by telephone. The
patient reported doing well after the biopsy with tenderness at the
site. Post biopsy instructions and care were reviewed and questions
were answered. The patient was encouraged to call The [REDACTED] for any additional concerns. My direct phone
number was provided.

Surgical consultation has been arranged with Dr. BOURDEAU at
[REDACTED] on [DATE].

Pathology results reported by BOURDEAU, RN on [DATE].



Lesion quadrant: Upper outer quadrant

Using sterile technique and 1% Lidocaine as local anesthetic, under
direct ultrasound visualization, a 14 gauge BOURDEAU device was
used to perform biopsy of mass in the left breast at 2 o'clock using
a lateral approach. At the conclusion of the procedure a ribbon
tissue marker clip was deployed into the biopsy cavity. Follow up 2
view mammogram was performed and dictated separately.
IMPRESSION: Ultrasound guided biopsy of a mass in the left breast at 2 o'clock.
No apparent complications.

## 2021-12-23 ENCOUNTER — Other Ambulatory Visit: Payer: Self-pay | Admitting: Surgery

## 2021-12-23 DIAGNOSIS — Z853 Personal history of malignant neoplasm of breast: Secondary | ICD-10-CM

## 2021-12-26 ENCOUNTER — Other Ambulatory Visit: Payer: Self-pay | Admitting: *Deleted

## 2021-12-26 DIAGNOSIS — C50412 Malignant neoplasm of upper-outer quadrant of left female breast: Secondary | ICD-10-CM | POA: Insufficient documentation

## 2021-12-27 ENCOUNTER — Ambulatory Visit
Admission: RE | Admit: 2021-12-27 | Discharge: 2021-12-27 | Disposition: A | Discharge status: Home / Self Care | Payer: 59 | Source: Ambulatory Visit | Attending: Radiation Oncology | Admitting: Radiation Oncology

## 2021-12-27 ENCOUNTER — Other Ambulatory Visit: Payer: Self-pay

## 2021-12-27 ENCOUNTER — Other Ambulatory Visit: Payer: Self-pay | Admitting: Surgery

## 2021-12-27 ENCOUNTER — Encounter: Payer: Self-pay | Admitting: Radiation Oncology

## 2021-12-27 ENCOUNTER — Telehealth: Payer: Self-pay | Admitting: Hematology

## 2021-12-27 VITALS — BP 166/98 | HR 91 | Temp 99.1°F | Resp 16 | Ht <= 58 in | Wt 141.8 lb

## 2021-12-27 DIAGNOSIS — C50412 Malignant neoplasm of upper-outer quadrant of left female breast: Secondary | ICD-10-CM

## 2021-12-27 DIAGNOSIS — Z170 Estrogen receptor positive status [ER+]: Secondary | ICD-10-CM | POA: Diagnosis not present

## 2021-12-27 DIAGNOSIS — Z803 Family history of malignant neoplasm of breast: Secondary | ICD-10-CM | POA: Diagnosis not present

## 2021-12-27 DIAGNOSIS — Z79899 Other long term (current) drug therapy: Secondary | ICD-10-CM | POA: Insufficient documentation

## 2021-12-27 DIAGNOSIS — Z853 Personal history of malignant neoplasm of breast: Secondary | ICD-10-CM

## 2021-12-27 NOTE — Progress Notes (Signed)
?Radiation Oncology         [phone] ?________________________________ ? ?Name: Autumn Wyatt        MRN: [id number]  ?Date of Service: 12/27/2021 DOB: 06-05-1966 ? ?CC:No primary care provider on file.  Coralie Keens, MD    ? ?REFERRING PHYSICIAN: Coralie Keens, MD ? ? ?DIAGNOSIS: The encounter diagnosis was Malignant neoplasm of upper-outer quadrant of left breast in female, estrogen receptor positive ([place]). ? ? ?HISTORY OF PRESENT ILLNESS: Autumn Wyatt is a 56 y.o. female seen in the multidisciplinary breast clinic for a new diagnosis of left breast cancer. The patient was noted to have a screen detected mass in the left breast that was seen in the 2 o'clock position and by ultrasound this measured 1.1 cm.  Her axilla was negative for adenopathy.  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  Her tumor was ER/PR positive HER2 negative with a Ki-67 of 1%.  She has met with Dr. Ninfa [place] and is awaiting an MRI scan for extent of disease due to the lobular phenotype.  She is anticipating breast conservation with lumpectomy and sentinel node.  She is seen to discuss adjuvant radiotherapy for her treatment. ? ? ? ?PREVIOUS RADIATION THERAPY: No ? ? ?PAST MEDICAL HISTORY:  ?Past Medical History:  ?Diagnosis Date  ? Abnormal pap   ? Allergy   ? Breast cancer ([place]) 12/14/2021  ? DIAPHORESIS 12/27/2009  ? Qualifier: Diagnosis of  By: Burnett Kanaris    ? Palpitations 12/27/2009  ? Qualifier: Diagnosis of  By: Burnett Kanaris    ? Shortness of breath 12/27/2009  ? Qualifier: Diagnosis of  By: Burnett Kanaris    ? SUPRAVENTRICULAR TACHYCARDIA 12/27/2009  ? Qualifier: Diagnosis of  By: Burnett Kanaris    ?   ? ? ?PAST SURGICAL HISTORY: ?Past Surgical History:  ?Procedure Laterality Date  ? BREAST BIOPSY Left 12/14/2021  ? KNEE SURGERY    ? REFRACTIVE SURGERY    ? SVT ABLATION N/A 05/21/2020  ? Procedure: SVT ABLATION;  Surgeon: Vickie Epley, MD;  Location: [place] [facility];  Service: Cardiovascular;  Laterality: N/A;  ? WISDOM TOOTH EXTRACTION    ? ? ? ?FAMILY HISTORY:  ?Family History  ?Problem Relation Age of Onset  ? Chronic granulomatous disease Mother   ? Chronic granulomatous disease Father   ? Breast cancer Paternal Aunt 63  ?     died at 71  ? Diabetes Paternal Grandmother   ? Heart disease Paternal Grandmother   ? ? ? ?SOCIAL HISTORY:  reports that she has never smoked. She has never used smokeless tobacco. She reports current alcohol use of about 5.0 - 10.0 standard drinks per week. She reports that she does not use drugs. ? ? ?ALLERGIES: Codeine, Demerol [meperidine], Midazolam, and Oxycodone ? ? ?MEDICATIONS:  ?Current Outpatient Medications  ?Medication Sig Dispense Refill  ? acetaminophen (TYLENOL) 500 MG tablet Take 500 mg by mouth every 6 (six) hours as needed for mild pain or moderate pain. (Patient not taking: Reported on 12/27/2021)    ? ibuprofen (ADVIL,MOTRIN) 600 MG tablet Take 400-600 mg by mouth every 6 (six) hours as needed for headache.  (Patient not taking: Reported on 12/27/2021)    ? Multiple Vitamins-Minerals (EMERGEN-C IMMUNE PLUS PO) Take 1 tablet by mouth daily as needed. Vitamin D3 (Patient not taking: Reported on 12/27/2021)    ? ?No current facility-administered medications for this encounter.  ? ? ? ?  REVIEW OF SYSTEMS: On review of systems, the patient reports that she is doing well overall. She is hoping to schedule her MRI soon. No other complaints are noted.  ? ?  ? ?PHYSICAL EXAM:  ?Wt Readings from Last 3 Encounters:  ?12/27/21 141 lb 12.8 oz (64.3 kg)  ?06/23/20 127 lb (57.6 kg)  ?05/21/20 120 lb (54.4 kg)  ? ?Temp Readings from Last 3 Encounters:  ?12/27/21 99.1 ?F (37.3 ?C)  ?05/21/20 97.7 ?F (36.5 ?C)  ? ?BP Readings from Last 3 Encounters:  ?12/27/21 (!) 166/98  ?06/23/20 120/78  ?05/21/20 101/62  ? ?Pulse Readings from Last 3 Encounters:  ?12/27/21 91  ?06/23/20 68  ?05/21/20 72  ? ? ?In general  this is a well appearing caucasian female in no acute distress. She's alert and oriented x4 and appropriate throughout the examination. Cardiopulmonary assessment is negative for acute distress and she exhibits normal effort. Bilateral breast exam is deferred. ? ? ? ?ECOG = 0 ? ?0 - Asymptomatic (Fully active, able to carry on all predisease activities without restriction) ? ?1 - Symptomatic but completely ambulatory (Restricted in physically strenuous activity but ambulatory and able to carry out work of a light or sedentary nature. For example, light housework, office work) ? ?2 - Symptomatic, <50% in bed during the day (Ambulatory and capable of all self care but unable to carry out any work activities. Up and about more than 50% of waking hours) ? ?3 - Symptomatic, >50% in bed, but not bedbound (Capable of only limited self-care, confined to bed or chair 50% or more of waking hours) ? ?4 - Bedbound (Completely disabled. Cannot carry on any self-care. Totally confined to bed or chair) ? ?5 - Death ? ? Oken MM, Creech RH, Tormey DC, et al. [phone]). "Toxicity and response criteria of the [facility]". [place] Oncol. 5 (6): 649-55 ? ? ? ?LABORATORY DATA:  ?Lab Results  ?Component Value Date  ? WBC 10.5 05/19/2020  ? HGB 14.7 05/19/2020  ? HCT 43.3 05/19/2020  ? MCV 99 (H) 05/19/2020  ? PLT 210 05/19/2020  ? ?Lab Results  ?Component Value Date  ? NA 138 05/19/2020  ? K 3.8 05/19/2020  ? CL 105 05/19/2020  ? CO2 24 05/19/2020  ? ?No results found for: ALT, AST, GGT, ALKPHOS, BILITOT ?  ? ?RADIOGRAPHY: US BREAST LTD UNI LEFT INC AXILLA ? ?Result Date: 12/13/2021 ?CLINICAL DATA:  56 year old female for further evaluation of possible LEFT breast distortion on screening mammogram. EXAM: DIGITAL DIAGNOSTIC UNILATERAL LEFT MAMMOGRAM WITH TOMOSYNTHESIS AND CAD; ULTRASOUND LEFT BREAST LIMITED TECHNIQUE: Left digital diagnostic mammography and breast tomosynthesis was performed. The images were  evaluated with computer-aided detection.; Targeted ultrasound examination of the left breast was performed. COMPARISON:  Prior studies ACR Breast Density Category c: The breast tissue is heterogeneously dense, which may obscure small masses. FINDINGS: 2D/3D spot compression views of the LEFT breast demonstrate a persistent irregular mass within the UPPER-OUTER LEFT breast at the site of the screening study finding. Targeted ultrasound is performed, showing a 1.1 x 1 x 0.8 cm irregular hypoechoic mass at the 2 o'clock position of the LEFT breast 5 cm from the nipple, corresponding to the screening study finding. No abnormal LEFT axillary lymph nodes are noted. IMPRESSION: 1. Highly suspicious 1.1 cm UPPER-OUTER LEFT breast mass. Tissue sampling is recommended. 2. No abnormal appearing LEFT axillary lymph nodes. RECOMMENDATION: Ultrasound-guided LEFT breast biopsy, which will be scheduled. I have discussed the findings and  recommendations with the patient. If applicable, a reminder letter will be sent to the patient regarding the next appointment. BI-RADS CATEGORY  5: Highly suggestive of malignancy. Electronically Signed   By: Margarette Canada M.D.   On: 12/13/2021 15:45 ? ?MM DIAG BREAST TOMO UNI LEFT ? ?Result Date: 12/13/2021 ?CLINICAL DATA:  56 year old female for further evaluation of possible LEFT breast distortion on screening mammogram. EXAM: DIGITAL DIAGNOSTIC UNILATERAL LEFT MAMMOGRAM WITH TOMOSYNTHESIS AND CAD; ULTRASOUND LEFT BREAST LIMITED TECHNIQUE: Left digital diagnostic mammography and breast tomosynthesis was performed. The images were evaluated with computer-aided detection.; Targeted ultrasound examination of the left breast was performed. COMPARISON:  Prior studies ACR Breast Density Category c: The breast tissue is heterogeneously dense, which may obscure small masses. FINDINGS: 2D/3D spot compression views of the LEFT breast demonstrate a persistent irregular mass within the UPPER-OUTER LEFT breast at  the site of the screening study finding. Targeted ultrasound is performed, showing a 1.1 x 1 x 0.8 cm irregular hypoechoic mass at the 2 o'clock position of the LEFT breast 5 cm from the nipple, correspondin

## 2021-12-27 NOTE — Telephone Encounter (Signed)
Scheduled appt per 5/15 referral. Pt is aware of appt date and time. Pt is aware to arrive 15 mins prior to appt time and to bring and updated insurance card. Pt is aware of appt location.   

## 2021-12-27 NOTE — Progress Notes (Signed)
New Breast Cancer Diagnosis: Left Breast UOQ ? ?Did patient present with symptoms (if so, please note symptoms) or screening mammography?:Screening Mass   ? ?Location and Extent of disease :left breast. Located at 2 o'clock position, measured 1.1 cm in greatest dimension. Adenopathy no. ? ?Histology per Pathology Report: grade 1/2, Invasive Mammary Carcinoma 12/14/2021 ? ?Receptor Status: ER(positive), PR (positive), Her2-neu (negative), Ki-(1%) ? ? ?Surgeon and surgical plan, if any:  ?Dr. Ninfa [place] ?-Left Breast Lumpectomy with radioactive seed and SLN biopsy 01/19/2022 ? ? ?Medical oncologist, treatment if any:   ?Dr. Burr Medico 12/29/2021 ? ? ?Family History of Breast/Ovarian/Prostate Cancer: Paternal Aunt had breast cancer. ? ?Lymphedema issues, if any: No    ? ?Pain issues, if any: No    ? ?SAFETY ISSUES: ?Prior radiation? No ?Pacemaker/ICD? No ?Possible current pregnancy? Menopausal ?Is the patient on methotrexate? No ? ?Current Complaints / other details:   ?

## 2021-12-28 ENCOUNTER — Other Ambulatory Visit: Payer: Self-pay | Admitting: *Deleted

## 2021-12-28 DIAGNOSIS — Z170 Estrogen receptor positive status [ER+]: Secondary | ICD-10-CM

## 2021-12-29 ENCOUNTER — Other Ambulatory Visit: Payer: Self-pay | Admitting: Surgery

## 2021-12-29 ENCOUNTER — Encounter: Payer: Self-pay | Admitting: Licensed Clinical Social Worker

## 2021-12-29 ENCOUNTER — Encounter: Payer: Self-pay | Admitting: Hematology

## 2021-12-29 ENCOUNTER — Other Ambulatory Visit: Payer: Self-pay

## 2021-12-29 ENCOUNTER — Inpatient Hospital Stay: Payer: 59 | Attending: Hematology | Admitting: Hematology

## 2021-12-29 VITALS — BP 132/89 | HR 94 | Temp 98.0°F | Resp 18 | Ht <= 58 in | Wt 139.5 lb

## 2021-12-29 DIAGNOSIS — C50412 Malignant neoplasm of upper-outer quadrant of left female breast: Secondary | ICD-10-CM | POA: Insufficient documentation

## 2021-12-29 DIAGNOSIS — I10 Essential (primary) hypertension: Secondary | ICD-10-CM | POA: Insufficient documentation

## 2021-12-29 DIAGNOSIS — Z803 Family history of malignant neoplasm of breast: Secondary | ICD-10-CM | POA: Insufficient documentation

## 2021-12-29 DIAGNOSIS — Z170 Estrogen receptor positive status [ER+]: Secondary | ICD-10-CM

## 2021-12-29 NOTE — Progress Notes (Signed)
[place] Work  Initial Assessment   Autumn Wyatt is a 56 y.o. year old female accompanied by patient and spouse, Remo Lipps (goes by Q[place]a Skye). Clinical Social Work was referred by medical provider for assessment of psychosocial needs.   SDOH (Social Determinants of Health) assessments performed: Yes   SDOH Screenings   Alcohol Screen: Not on file  Depression (PHQ2-9): Not on file  Financial Resource Strain: Not on file  Food Insecurity: Not on file  Housing: Not on file  Physical Activity: Not on file  Social Connections: Not on file  Stress: Not on file  Tobacco Use: Low Risk    Smoking Tobacco Use: Never   Smokeless Tobacco Use: Never   Passive Exposure: Not on file  Transportation Needs: Not on file     Distress Screen completed: No    12/27/2021    2:07 PM  ONCBCN DISTRESS SCREENING  Screening Type Initial Screening  Distress experienced in past week (1-10) 5  Family Problem type Children  Emotional problem type Adjusting to illness      Family/Social Information:  Housing Arrangement: patient lives with spouse and  adult son who is home from college for the summer. Family members/support persons in your life? Family- pt also has an adult daughter who is an ICU nurse at [employer] concerns: no  Employment: Working full time .  Income source: Employment Financial concerns: No Type of concern: None Food access concerns: no Religious or spiritual practice: Not known Services Currently in place:  none  Coping/ Adjustment to diagnosis: Patient understands treatment plan and what happens next? yes Concerns about diagnosis and/or treatment: I'm not especially worried about anything Patient reported stressors:  none at this time Hopes and/or priorities: Pt's priority is to start treatment w/ the hope of positive results Patient enjoys time with family/ friends Current coping skills/ strengths: Supportive family/friends      SUMMARY: Current SDOH Barriers:  None identified at this time  Clinical Social Work Clinical Goal(s):  CSW goal is to support pt throughout duration of treatment as she deems appropriate.  Interventions: Discussed common feeling and emotions when being diagnosed with cancer, and the importance of support during treatment Informed patient of the support team roles and support services at [facility] Provided CSW contact information and encouraged patient to call with any questions or concerns    Follow Up Plan: Patient will contact CSW with any support or resource needs Patient verbalizes understanding of plan: Yes    Henriette Combs, LCSW

## 2021-12-29 NOTE — Progress Notes (Addendum)
[place]   [phone]) [phone] Fax:([phone]) [place] Note   Patient Care Team: Delsa Bern, MD as PCP - General (Obstetrics and Gynecology) Vickie Epley, MD as PCP - Electrophysiology (Cardiology) Vickie Epley, MD as PCP - Cardiology (Cardiology)  Date of Service:  12/29/2021   CHIEF COMPLAINTS/PURPOSE OF CONSULTATION:  Left Breast Cancer, ER+  REFERRING PHYSICIAN:  Dr. Ninfa Linden   ASSESSMENT & PLAN:  Autumn Wyatt is a 56 y.o. peri-menopausal female with a history of SVT, HTN  1. Malignant neoplasm of upper-outer quadrant of left breast, invasive lobular carcinoma, stage IA, c(T1c, N0), ER+/PR+/HER2-, Grade 1/2, (+)  LCIS -found on screening mammogram. Left MM and [place] on 12/13/21 showed 1.1 cm mass at 2 o'clock. Biopsy on 12/14/21 showed invasive and in situ carcinoma, e-cadherin negative. --We discussed her imaging findings and the biopsy results in great details.  Given the strong ER/PR positivity, negative HER2, low Ki-67, this is likely low risk disease.  We also discussed that lobular carcinoma is more sensitive to estrogen, but later recurrence can happen. -She is scheduled for lumpectomy on 01/19/22 with Dr. Ninfa Linden. -I recommend a Oncotype Dx test on the surgical sample and we'll make a decision about adjuvant chemotherapy based on the Oncotype result. Written material of this test was given to her. She is young and fit, would be a good candidate for chemotherapy if her Oncotype recurrence score is high. -Giving the strong ER and PR expression in her probable premenopausal status, I recommend adjuvant endocrine therapy with Tamoxifen for a total of 10 years to reduce the risk of cancer recurrence. Potential benefits and side effects were discussed with patient and she is interested. She notes she is planning to have [facility] done with her GYN soon. I have asked her to have her GYN office forward [place] the results.  If she became  postmenopausal in the next few years, we will switch her to AI. -She was also seen by radiation oncologist Dr. Lisbeth Renshaw on 12/27/21. She will likely benefit from breast radiation if she undergo lumpectomy to decrease the risk of breast cancer. -We also discussed the breast cancer surveillance after her surgery. She will continue annual screening mammogram, self exam, and a routine office visit with lab and exam with [place]. -I encouraged her to have healthy diet and exercise regularly.   2. Genetics  -Her maternal aunt had breast cancer at age of 24, she qualifies for genetic testing. -She declined genetic testing at this point, may consider it in future when she has better insurance coverage   PLAN:  -lumpectomy and sentinel lymph node biopsy on 01/19/22 -We will send her surgical sample for Oncotype DX -f/u after surgery if oncotype is high risk or after radiation if low risk.   Oncology History  Malignant neoplasm of upper-outer quadrant of left breast in female, estrogen receptor positive ([place])  12/13/2021 Mammogram   CLINICAL DATA:  56 year old female for further evaluation of possible LEFT breast distortion on screening mammogram.   EXAM: DIGITAL DIAGNOSTIC UNILATERAL LEFT MAMMOGRAM WITH TOMOSYNTHESIS AND CAD; ULTRASOUND LEFT BREAST LIMITED  IMPRESSION: 1. Highly suspicious 1.1 cm UPPER-OUTER LEFT breast mass. Tissue sampling is recommended. 2. No abnormal appearing LEFT axillary lymph nodes.   12/14/2021 Initial Biopsy   Diagnosis Breast, left, needle core biopsy, 2:00 5cmfn - INVASIVE MAMMARY CARCINOMA, GRADE 1/2. - MAMMARY CARCINOMA IN SITU. - SEE NOTE. Diagnosis Note The greatest tumor dimension is 0.9 cm.  Addendum: Immunohistochemistry for  E-cadherin is negative consistent with lobular carcinoma.  PROGNOSTIC INDICATORS Results: The tumor cells are EQUIVOCAL for Her2 (2+). Her2 by FISH will be performed and the results reported separately. Estrogen Receptor: 70%, POSITIVE,  STRONG STAINING INTENSITY Progesterone Receptor: 90%, POSITIVE, STRONG STAINING INTENSITY Proliferation Marker Ki67: 1%  FLUORESCENCE IN-SITU HYBRIDIZATION Results: GROUP 5: HER2 **NEGATIVE**   12/26/2021 Initial Diagnosis   Malignant neoplasm of upper-outer quadrant of left breast in female, estrogen receptor positive ([place])      HISTORY OF PRESENTING ILLNESS:  Autumn Wyatt 56 y.o. female is a here because of breast cancer. The patient was referred by Dr. Ninfa Linden. The patient presents to the clinic today accompanied by her husband.   She had routine screening mammography showing a possible abnormality in the left breast. She underwent left diagnostic mammography and left breast ultrasonography on 12/13/21 showing: suspicious 1.1 cm mass at 2 o'clock; no abnormal lymph nodes.  Biopsy on 12/14/21 showed: invasive and in situ mammary carcinoma, grade 1/2, e-cadherin negative. Prognostic indicators significant for: estrogen receptor, 70% positive and progesterone receptor, 90% positive. Proliferation marker Ki67 at 1%. HER2 negative by FISH.   Today the patient notes they felt/feeling prior/after... -no complaints today  She has a PMHx of.... -h/o of SVT in 2021 -HTN  Socially... 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 -she is married with two children, ages 51 (daughter) and 67 (son). -she drinks 1-2 alcoholic drinks per day (wine and/or beer) -never smoker   GYN HISTORY  Menarchal: xx LMP: 12/07/21 Contraceptive: recent use, discontinued with cancer diagnosis HRT: n/a GP: 2   REVIEW OF SYSTEMS:    Constitutional: Denies fevers, chills or abnormal night sweats Eyes: Denies blurriness of vision, double vision or watery eyes Ears, nose, mouth, throat, and face: Denies mucositis or sore throat Respiratory: Denies cough, dyspnea or wheezes Cardiovascular: Denies palpitation, chest discomfort or lower extremity swelling Gastrointestinal:  Denies nausea, heartburn or change in bowel  habits Skin: Denies abnormal skin rashes Lymphatics: Denies new lymphadenopathy or easy bruising Neurological:Denies numbness, tingling or new weaknesses Behavioral/Psych: Mood is stable, no new changes  All other systems were reviewed with the patient and are negative.   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   Qualifier: Diagnosis of  By: Burnett Kanaris      SURGICAL HISTORY: Past Surgical History:  Procedure Laterality Date   BREAST BIOPSY Left 12/14/2021   EYE SURGERY  2006   KNEE SURGERY     REFRACTIVE SURGERY     SVT ABLATION N/A 05/21/2020   Procedure: SVT ABLATION;  Surgeon: Vickie Epley, MD;  Location: [facility];  Service: Cardiovascular;  Laterality: N/A;   WISDOM TOOTH EXTRACTION      SOCIAL HISTORY: Social History   Socioeconomic History   Marital status: Married    Spouse name: Not on file   Number of children: 2   Years of education: Not on file   Highest education level: Not on file  Occupational History   Not on file  Tobacco Use   Smoking status: Never   Smokeless tobacco: Never  Substance  and Sexual Activity   Alcohol use: Yes    Alcohol/week: 5.0 - 10.0 standard drinks    Types: 5 - 10 Glasses of wine per week   Drug use: No   Sexual activity: Yes    Birth control/protection: Condom  Other Topics Concern   Not on file  Social History Narrative   Not on file   Social Determinants of Health   Financial Resource Strain: Not on file  Food Insecurity: Not on file  Transportation Needs: Not on file  Physical Activity: Not on file  Stress: Not on file  Social  Connections: Not on file  Intimate Partner Violence: Not on file    FAMILY HISTORY: Family History  Problem Relation Age of Onset   Chronic granulomatous disease Mother    Diabetes Mother    Chronic granulomatous disease Father    Breast cancer Paternal Aunt 64       died at 24   Diabetes Paternal Grandmother    Heart disease Paternal Grandmother     ALLERGIES:  is allergic to codeine, demerol [meperidine], midazolam, and oxycodone.  MEDICATIONS:  Current Outpatient Medications  Medication Sig Dispense Refill   acetaminophen (TYLENOL) 500 MG tablet Take 500 mg by mouth every 6 (six) hours as needed for mild pain or moderate pain. (Patient not taking: Reported on 12/27/2021)     ibuprofen (ADVIL,MOTRIN) 600 MG tablet Take 400-600 mg by mouth every 6 (six) hours as needed for headache.  (Patient not taking: Reported on 12/27/2021)     Multiple Vitamins-Minerals (EMERGEN-C IMMUNE PLUS PO) Take 1 tablet by mouth daily as needed. Vitamin D3 (Patient not taking: Reported on 12/27/2021)     No current facility-administered medications for this visit.    PHYSICAL EXAMINATION: ECOG PERFORMANCE STATUS: 0 - Asymptomatic  Vitals:   12/29/21 1455  BP: 132/89  Pulse: 94  Resp: 18  Temp: 98 F (36.7 C)  SpO2: 98%   Filed Weights   12/29/21 1455  Weight: 139 lb 8 oz (63.3 kg)    GENERAL:alert, no distress and comfortable SKIN: skin color, texture, turgor are normal, no rashes or significant lesions EYES: normal, Conjunctiva are pink and non-injected, sclera clear  NECK: supple, thyroid normal size, non-tender, without nodularity LYMPH:  no palpable lymphadenopathy in the cervical, axillary  LUNGS: clear to auscultation and percussion with normal breathing effort HEART: regular rate & rhythm and no murmurs and no lower extremity edema ABDOMEN:abdomen soft, non-tender and normal bowel sounds Musculoskeletal:no cyanosis of digits and no clubbing  NEURO: alert & oriented x 3 with  fluent speech, no focal motor/sensory deficits BREAST: No palpable mass, nodules or adenopathy bilaterally. Breast exam benign.  LABORATORY DATA:  I have reviewed the data as listed    Latest Ref Rng & Units 05/19/2020   10:45 AM 01/05/2010   12:23 PM 08/19/2007   10:43 PM  CBC  WBC 3.4 - 10.8 x10E3/uL 10.5      Hemoglobin 11.1 - 15.9 g/dL 14.7   14.2   16.3    Hematocrit 34.0 - 46.6 % 43.3    48.0    Platelets 150 - 450 x10E3/uL 210           Latest Ref Rng & Units 05/19/2020   10:45 AM 08/19/2007   10:43 PM  CMP  Glucose 65 - 99 mg/dL 89   121    BUN 6 - 24 mg/dL 11   10    Creatinine 0.57 - 1.00 mg/dL 0.53  0.7    Sodium 134 - 144 mmol/L 138   141    Potassium 3.5 - 5.2 mmol/L 3.8   4.1    Chloride 96 - 106 mmol/L 105   110    CO2 20 - 29 mmol/L 24     Calcium 8.7 - 10.2 mg/dL 9.2        RADIOGRAPHIC STUDIES: I have personally reviewed the radiological images as listed and agreed with the findings in the report. US BREAST LTD UNI LEFT INC AXILLA  Result Date: 12/13/2021 CLINICAL DATA:  56 year old female for further evaluation of possible LEFT breast distortion on screening mammogram. EXAM: DIGITAL DIAGNOSTIC UNILATERAL LEFT MAMMOGRAM WITH TOMOSYNTHESIS AND CAD; ULTRASOUND LEFT BREAST LIMITED TECHNIQUE: Left digital diagnostic mammography and breast tomosynthesis was performed. The images were evaluated with computer-aided detection.; Targeted ultrasound examination of the left breast was performed. COMPARISON:  Prior studies ACR Breast Density Category c: The breast tissue is heterogeneously dense, which may obscure small masses. FINDINGS: 2D/3D spot compression views of the LEFT breast demonstrate a persistent irregular mass within the UPPER-OUTER LEFT breast at the site of the screening study finding. Targeted ultrasound is performed, showing a 1.1 x 1 x 0.8 cm irregular hypoechoic mass at the 2 o'clock position of the LEFT breast 5 cm from the nipple, corresponding to the  screening study finding. No abnormal LEFT axillary lymph nodes are noted. IMPRESSION: 1. Highly suspicious 1.1 cm UPPER-OUTER LEFT breast mass. Tissue sampling is recommended. 2. No abnormal appearing LEFT axillary lymph nodes. RECOMMENDATION: Ultrasound-guided LEFT breast biopsy, which will be scheduled. I have discussed the findings and recommendations with the patient. If applicable, a reminder letter will be sent to the patient regarding the next appointment. BI-RADS CATEGORY  5: Highly suggestive of malignancy. Electronically Signed   By: Margarette Canada M.D.   On: 12/13/2021 15:45  MM DIAG BREAST TOMO UNI LEFT  Result Date: 12/13/2021 CLINICAL DATA:  56 year old female for further evaluation of possible LEFT breast distortion on screening mammogram. EXAM: DIGITAL DIAGNOSTIC UNILATERAL LEFT MAMMOGRAM WITH TOMOSYNTHESIS AND CAD; ULTRASOUND LEFT BREAST LIMITED TECHNIQUE: Left digital diagnostic mammography and breast tomosynthesis was performed. The images were evaluated with computer-aided detection.; Targeted ultrasound examination of the left breast was performed. COMPARISON:  Prior studies ACR Breast Density Category c: The breast tissue is heterogeneously dense, which may obscure small masses. FINDINGS: 2D/3D spot compression views of the LEFT breast demonstrate a persistent irregular mass within the UPPER-OUTER LEFT breast at the site of the screening study finding. Targeted ultrasound is performed, showing a 1.1 x 1 x 0.8 cm irregular hypoechoic mass at the 2 o'clock position of the LEFT breast 5 cm from the nipple, corresponding to the screening study finding. No abnormal LEFT axillary lymph nodes are noted. IMPRESSION: 1. Highly suspicious 1.1 cm UPPER-OUTER LEFT breast mass. Tissue sampling is recommended. 2. No abnormal appearing LEFT axillary lymph nodes. RECOMMENDATION: Ultrasound-guided LEFT breast biopsy, which will be scheduled. I have discussed the findings and recommendations with the patient. If  applicable, a reminder letter will be sent to the patient regarding the next appointment. BI-RADS CATEGORY  5: Highly suggestive of malignancy. Electronically Signed   By: Margarette Canada M.D.   On: 12/13/2021 15:45  MM CLIP PLACEMENT LEFT  Result Date: 12/14/2021 CLINICAL DATA:  Post procedure mammogram for clip placement EXAM: 3D DIAGNOSTIC LEFT MAMMOGRAM POST ULTRASOUND BIOPSY COMPARISON:  Previous exam(s). FINDINGS: 3D Mammographic images were obtained following ultrasound guided biopsy of a mass in the left  breast at 2 o'clock. The biopsy marking clip is in expected position at the site of biopsy. IMPRESSION: Appropriate positioning of the ribbon shaped biopsy marking clip at the site of biopsy in the left breast at 2 o'clock. Final Assessment: Post Procedure Mammograms for Marker Placement Electronically Signed   By: Audie Pinto M.D.   On: 12/14/2021 15:23  [place] LT BREAST BX W LOC DEV 1ST LESION IMG BX SPEC US GUIDE  Addendum Date: 12/15/2021   ADDENDUM REPORT: 12/15/2021 16:57 ADDENDUM: Pathology revealed GRADE I-II INVASIVE MAMMARY CARCINOMA, MAMMARY CARCINOMA IN SITU of the LEFT breast, 2:00 o'clock, 5cmfn, (ribbon clip). This was found to be concordant by Dr. Audie Pinto. Pathology results were discussed with the patient by telephone. The patient reported doing well after the biopsy with tenderness at the site. Post biopsy instructions and care were reviewed and questions were answered. The patient was encouraged to call [facility] for any additional concerns. My direct phone number was provided. Surgical consultation has been arranged with Dr. Nedra Hai at [facility] on Dec 23, 2021. Pathology results reported by Terie Purser, RN on 12/15/2021. Electronically Signed   By: Audie Pinto M.D.   On: 12/15/2021 16:57   Result Date: 12/15/2021 CLINICAL DATA:  56 year old female presenting for biopsy of a mass in the left breast. EXAM: ULTRASOUND  GUIDED LEFT BREAST CORE NEEDLE BIOPSY COMPARISON:  None Available. PROCEDURE: I met with the patient and we discussed the procedure of ultrasound-guided biopsy, including benefits and alternatives. We discussed the high likelihood of a successful procedure. We discussed the risks of the procedure, including infection, bleeding, tissue injury, clip migration, and inadequate sampling. Informed written consent was given. The usual time-out protocol was performed immediately prior to the procedure. Lesion quadrant: Upper outer quadrant Using sterile technique and 1% Lidocaine as local anesthetic, under direct ultrasound visualization, a 14 gauge spring-loaded device was used to perform biopsy of mass in the left breast at 2 o'clock using a lateral approach. At the conclusion of the procedure a ribbon tissue marker clip was deployed into the biopsy cavity. Follow up 2 view mammogram was performed and dictated separately. IMPRESSION: Ultrasound guided biopsy of a mass in the left breast at 2 o'clock. No apparent complications. Electronically Signed: By: Audie Pinto M.D. On: 12/14/2021 15:27    No orders of the defined types were placed in this encounter.   All questions were answered. The patient knows to call the clinic with any problems, questions or concerns. The total time spent in the appointment was 60 minutes.     Truitt Merle, MD 12/29/2021   I, Wilburn Mylar, am acting as scribe for Truitt Merle, MD.   I have reviewed the above documentation for accuracy and completeness, and I agree with the above.

## 2021-12-30 ENCOUNTER — Ambulatory Visit
Admission: RE | Admit: 2021-12-30 | Discharge: 2021-12-30 | Disposition: A | Discharge status: Home / Self Care | Payer: 59 | Source: Ambulatory Visit | Attending: Surgery | Admitting: Surgery

## 2021-12-30 DIAGNOSIS — C50412 Malignant neoplasm of upper-outer quadrant of left female breast: Secondary | ICD-10-CM

## 2021-12-30 IMAGING — MR MR BREAST BILAT WO/W CM
8 of 13 series · 32 of 48 positions shown · IV contrast (7 ml gadavist)
Comparison: [DATE] and earlier

CLINICAL DATA: Recent diagnosis of grade 1-2 invasive mammary
carcinoma and mammary carcinoma in situ in the 2 o'clock location of
the LEFT breast. The patient's maternal aunt was diagnosed with
breast cancer at age 31.

EXAM:
BILATERAL BREAST MRI WITH AND WITHOUT CONTRAST
TECHNIQUE: Multiplanar, multisequence MR images of both breasts were obtained
prior to and following the intravenous administration of 7 ml of
Gadavist

[Series 2: t2_tirm_tra ipat (a-p) · axial · 3.0mm · 0.70mm/px · 1 of 55 slices shown]
[im 1/55]
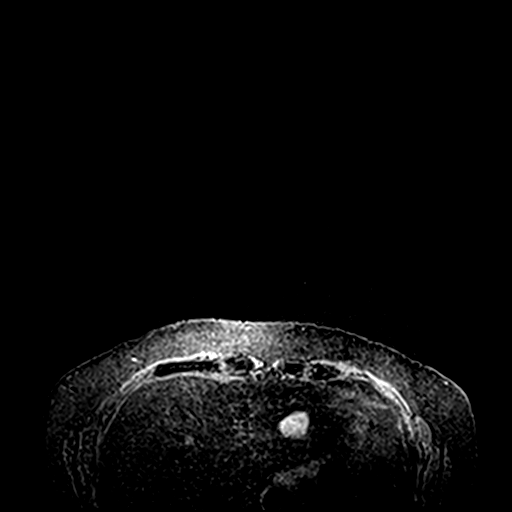

[Series 3: fl3d pre-cm no · axial · non-contrast · 1.2mm · 0.94mm/px · z∈[-63,+109]mm · 5 of 144 slices shown]
[im 1/144]
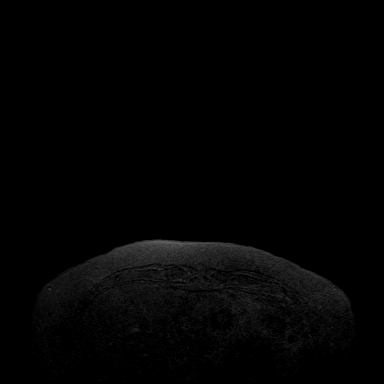
[im 36/144]
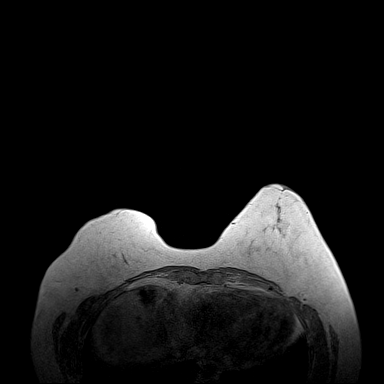
[im 72/144]
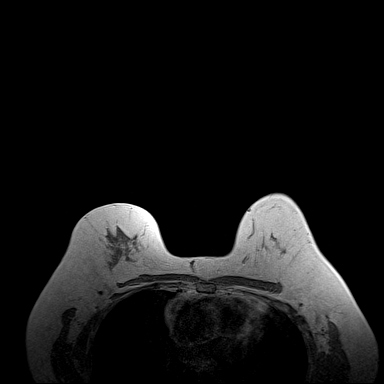
[im 108/144]
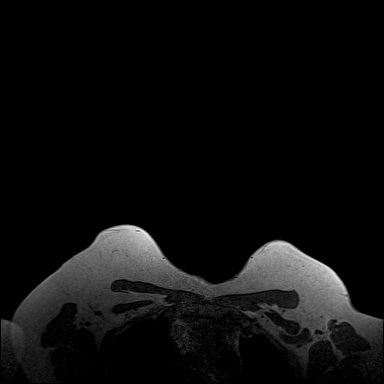
[im 144/144]
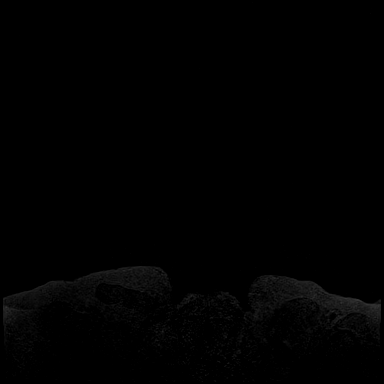

[Series 4: fl3d pre-cm · axial · non-contrast · 1.2mm · 0.94mm/px · z∈[-63,+109]mm · 5 of 144 slices shown]
[im 1/144]
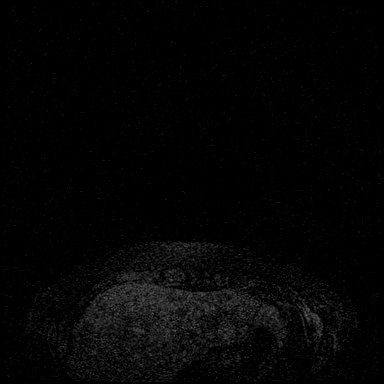
[im 36/144]
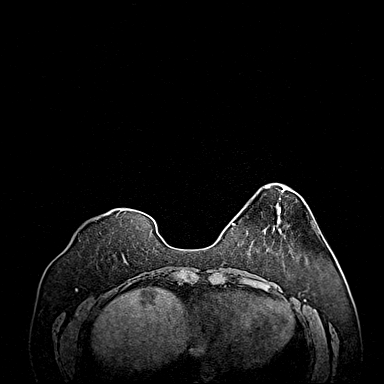
[im 72/144]
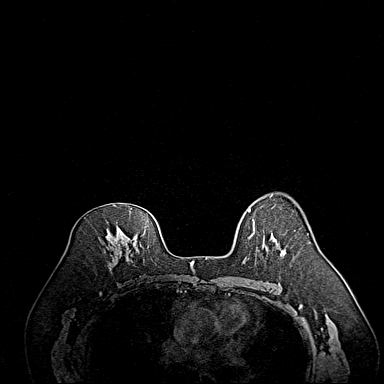
[im 108/144]
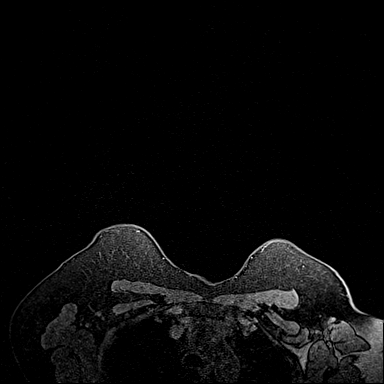
[im 144/144]
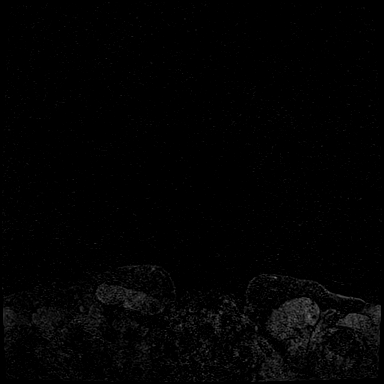

[Series 5: fl3d post immediate · axial · 1.2mm · 0.94mm/px · z∈[-63,+109]mm · 5 of 144 slices shown (1 of 3)]
[im 1/144]
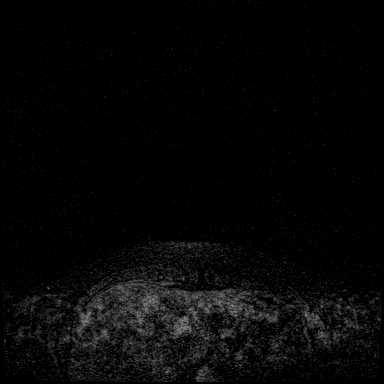
[im 36/144]
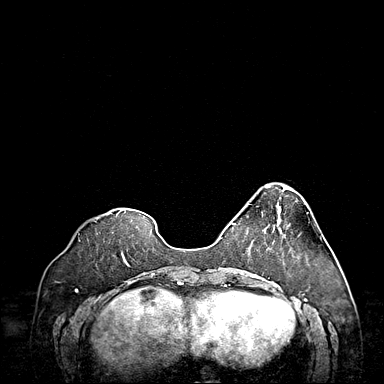
[im 72/144]
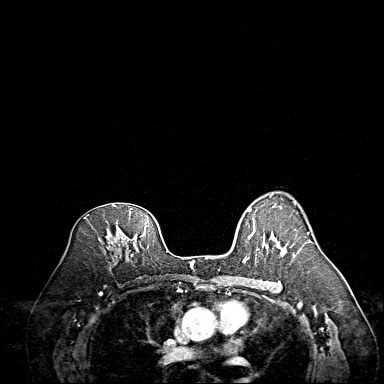
[im 108/144]
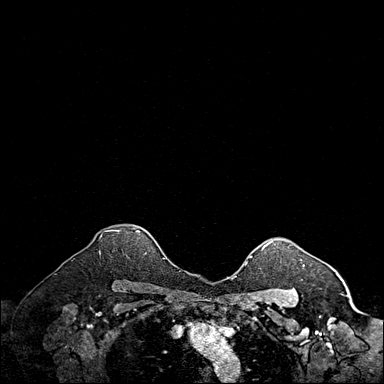
[im 144/144]
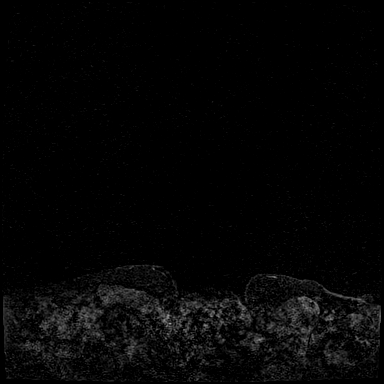

[Series 6: fl3d post immediate · axial · 1.2mm · 0.94mm/px · z∈[-63,+109]mm · 5 of 144 slices shown (2 of 3)]
[im 1/144]
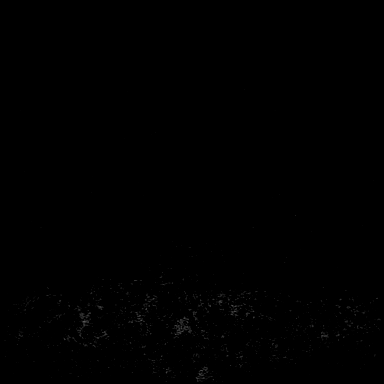
[im 36/144]
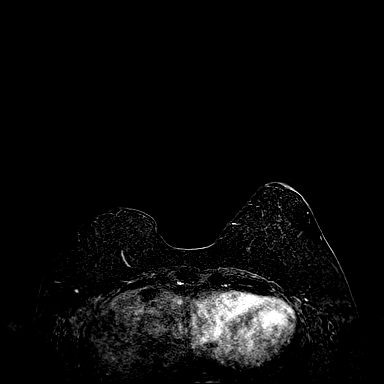
[im 72/144]
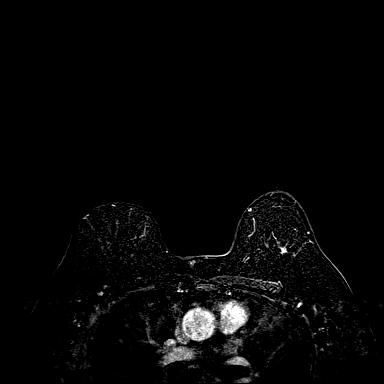
[im 108/144]
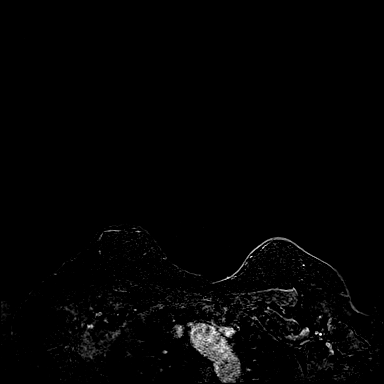
[im 144/144]
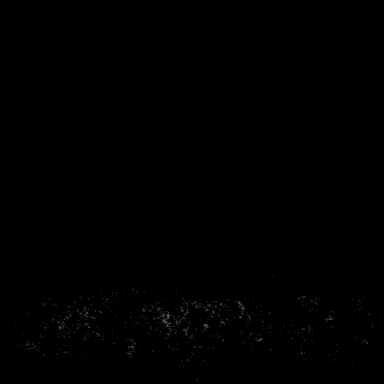

[Series 7: fl3d post immediate · axial · 172.8mm · 0.94mm/px · 1 of 1 slices shown (3 of 3)]
[im 1/1]
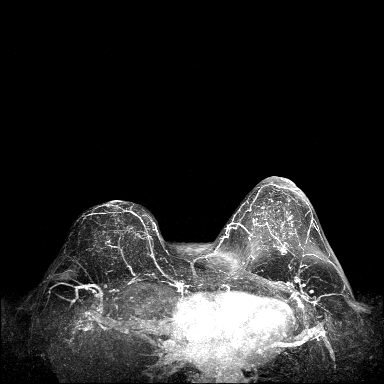

[Series 8: fl3d post 3min · axial · 1.2mm · 0.94mm/px · z∈[-63,+109]mm · 5 of 144 slices shown]
[im 1/144]
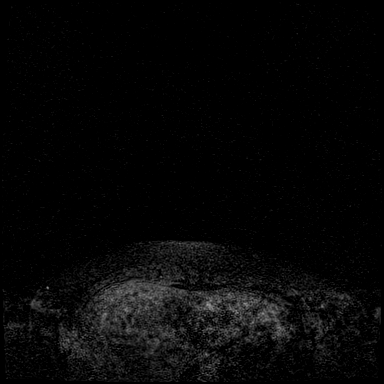
[im 36/144]
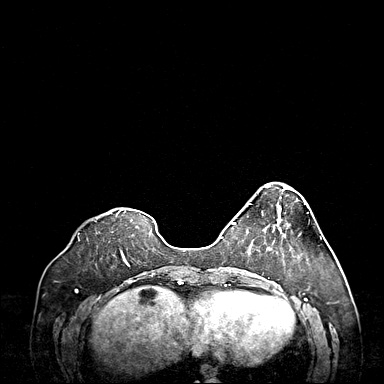
[im 72/144]
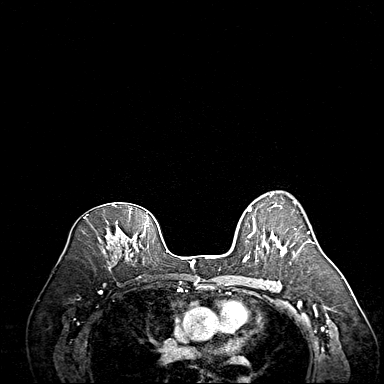
[im 108/144]
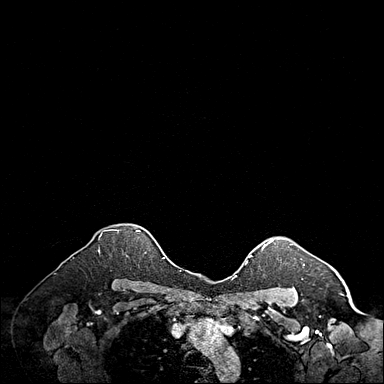
[im 144/144]
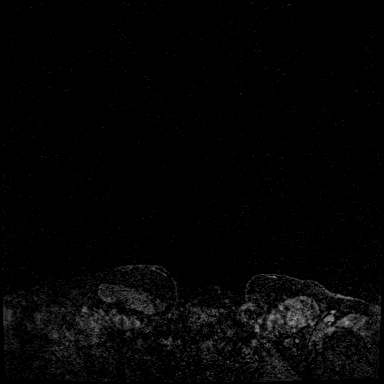

[Series 9: fl3d post 3min_sub · axial · 1.2mm · 0.94mm/px · z∈[-63,+74]mm · 5 of 144 slices shown]
[im 1/144]
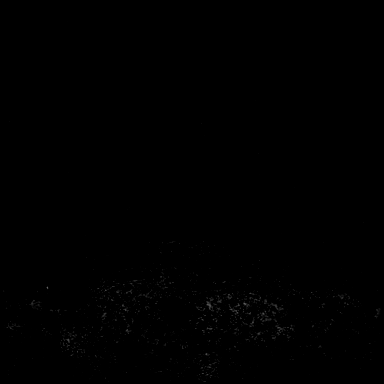
[im 29/144]
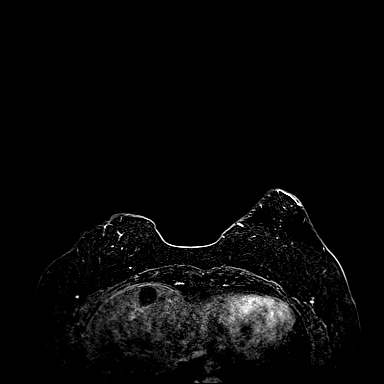
[im 58/144]
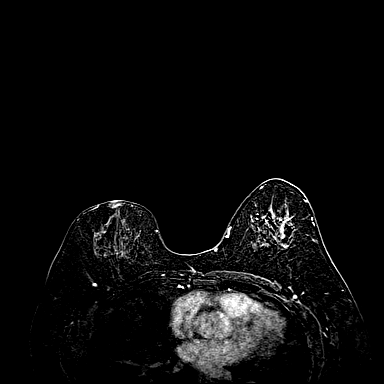
[im 86/144]
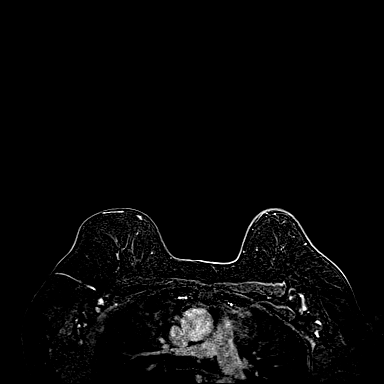
[im 115/144]
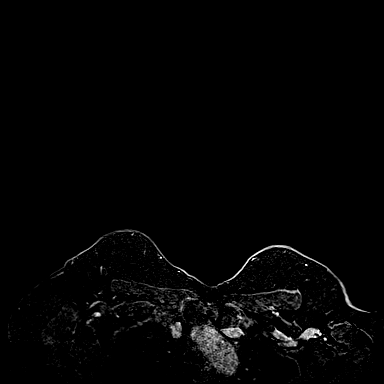

[32 of 48 positions shown; findings below may reference images not displayed]

Three-dimensional MR images were rendered by post-processing of the
original MR data on an independent workstation. The
three-dimensional MR images were interpreted, and findings are
reported in the following complete MRI report for this study. Three
dimensional images were evaluated at the independent interpreting
workstation using the DynaCAD thin client.
FINDINGS: Breast composition: c. Heterogeneous fibroglandular tissue.

Background parenchymal enhancement: Mild

Right breast: No mass or abnormal enhancement.

Left breast: Within the UPPER-OUTER QUADRANT of the LEFT breast,
there is an enhancing mass with tissue marker clip artifact,
measuring approximately 1.1 x 1.3 centimeters. (Image 2 of series
[NX]). Extending anterior to this lesion there is linear non mass
enhancement extending to the base of the nipple, measuring a total
of 6.8 centimeters (image 5 of series [NX]). Enhancement shows
persistent kinetics.

Lymph nodes: No abnormal appearing lymph nodes.

Ancillary findings:  Hepatic cysts.
IMPRESSION: 1. Known malignancy in the UPPER-OUTER QUADRANT of the LEFT breast
measuring approximately 1.3 centimeters.
2. Additional linear non mass enhancement extends from the anterior
aspect of the known malignancy to the base of the nipple, measuring
a total of 6.8 centimeters.
3. RIGHT breast is negative.
4. No axillary adenopathy.
5. Benign hepatic cysts.

RECOMMENDATION:
If breast conservation is being considered, recommend additional MR
guided core biopsy of the non mass enhancement extending to the
retroareolar region of the LEFT breast to document extent of
disease.

Treatment plan for known LEFT breast malignancy.

BI-RADS CATEGORY  4: Suspicious.

## 2021-12-30 MED ORDER — GADOBUTROL 1 MMOL/ML IV SOLN
7.0000 mL | Freq: Once | INTRAVENOUS | Status: AC | PRN
  Administered 2021-12-30: 7 mL via INTRAVENOUS

## 2022-01-01 NOTE — Therapy (Signed)
OUTPATIENT PHYSICAL THERAPY BREAST CANCER BASELINE EVALUATION   Patient Name: Autumn Wyatt MRN: [id number] DOB:1965/08/30, 56 y.o., female Today's Date: 01/02/2022   PT End of Session - 01/02/22 0854     Visit Number 1    Number of Visits 2    Date for PT Re-Evaluation 02/27/22    PT Start Time 0800    PT Stop Time 0850    PT Time Calculation (min) 50 min    Activity Tolerance Patient tolerated treatment well    Behavior During Therapy [facility] for tasks assessed/performed             Past Medical History:  Diagnosis Date   Abnormal pap    Allergy    Breast cancer ([place]) 12/14/2021   DIAPHORESIS 12/27/2009   Qualifier: Diagnosis of  By: Burnett Kanaris     Palpitations 12/27/2009   Qualifier: Diagnosis of  By: Burnett Kanaris     Shortness of breath 12/27/2009   Qualifier: Diagnosis of  By: Barnes, Truchas TACHYCARDIA 12/27/2009   Qualifier: Diagnosis of  By: Burnett Kanaris     Past Surgical History:  Procedure Laterality Date   BREAST BIOPSY Left 12/14/2021   EYE SURGERY  2006   KNEE SURGERY     REFRACTIVE SURGERY     SVT ABLATION N/A 05/21/2020   Procedure: SVT ABLATION;  Surgeon: Vickie Epley, MD;  Location: [place] [facility];  Service: Cardiovascular;  Laterality: N/A;   WISDOM TOOTH EXTRACTION     Patient Active Problem List   Diagnosis Date Noted   Malignant neoplasm of upper-outer quadrant of left breast in female, estrogen receptor positive ([place]) 12/26/2021   SVT (supraventricular tachycardia) ([place]) 12/27/2009   DIAPHORESIS 12/27/2009   PALPITATIONS 12/27/2009   SHORTNESS OF BREATH 12/27/2009    PCP: Dr.  Delsa Bern  REFERRING PROVIDER: Coralie Keens, MD  REFERRING DIAG: Left Breast Cancer  THERAPY DIAG:  Malignant neoplasm of upper-outer quadrant of left breast in female, estrogen receptor positive ([place])  Abnormal posture  Rationale for Evaluation and Treatment Rehabilitation  ONSET DATE:  12/14/2021  SUBJECTIVE                                                                                                                                                                                           SUBJECTIVE STATEMENT: Patient reports she is here today to be seen by her medical team for her newly diagnosed left breast cancer. 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  It measures 1.1 cm and is located in the upper outer  quadrant. It is ER+, PR+ and HER 2- with a Ki67 of 1%. She has a family history of breast cancer in her paternal aunt who died in her 50s from breast cancer.She has a history of SVT and had a heart ablation many years ago and has had no cardiac issues since then.   PATIENT GOALS   reduce lymphedema risk and learn post op HEP.   PAIN:  Are you having pain? No   PRECAUTIONS: Active CA, sensitive skin bilateral UE's elbow to wrist, Bilateral Cubital tunnel releases and CTS release  HAND DOMINANCE: right  WEIGHT BEARING RESTRICTIONS No  FALLS:  Has patient fallen in last 6 months? No  LIVING ENVIRONMENT: Patient lives with: her husband, son Lives in: House/apartment Has following equipment at home: Crutches  OCCUPATION: not working  LEISURE: RV a lot, being [employer]  PRIOR LEVEL OF FUNCTION: Independent   OBJECTIVE  COGNITION:  Overall cognitive status: Within functional limits for tasks assessed    POSTURE:  Forward head and rounded shoulders posture  UPPER EXTREMITY AROM/PROM:  A/PROM RIGHT   Eval 01/02/2022   Shoulder extension 59  Shoulder flexion 165  Shoulder abduction 178  Shoulder internal rotation 55  Shoulder external rotation 117    (Blank rows = not tested)  A/PROM LEFT   Eval 01/02/2022  Shoulder extension 57  Shoulder flexion 167  Shoulder abduction 178  Shoulder internal rotation 60  Shoulder  external rotation 115    (Blank rows = not tested)   CERVICAL AROM: All within normal limits:    UPPER EXTREMITY STRENGTH: WNL   LYMPHEDEMA ASSESSMENTS:   LANDMARK RIGHT   Eval 01/02/2022  10 cm proximal to olecranon process 31.1  Olecranon process 23.2  10 cm proximal to ulnar styloid process 22.2  Just proximal to ulnar styloid process 14.4  Across hand at thumb web space 17.2  At base of 2nd digit 5.8  (Blank rows = not tested)  LANDMARK LEFT   Eval 01/02/2022  10 cm proximal to olecranon process 31.0  Olecranon process 22.85  10 cm proximal to ulnar styloid process 21.3  Just proximal to ulnar styloid process 14.45  Across hand at thumb web space 16.9  At base of 2nd digit 5.7  (Blank rows = not tested)   L-DEX LYMPHEDEMA SCREENING:  The patient was assessed using the L-Dex machine today to produce a lymphedema index baseline score. The patient will be reassessed on a regular basis (typically every 3 months) to obtain new L-Dex scores. If the score is > 6.5 points away from his/her baseline score indicating onset of subclinical lymphedema, it will be recommended to wear a compression garment for 4 weeks, 12 hours per day and then be reassessed. If the score continues to be > 6.5 points from baseline at reassessment, we will initiate lymphedema treatment. Assessing in this manner has a 95% rate of preventing clinically significant lymphedema.   L-DEX FLOWSHEETS - 01/02/22 0800       L-DEX LYMPHEDEMA SCREENING   Measurement Type Unilateral    L-DEX MEASUREMENT EXTREMITY Upper Extremity    POSITION  Standing    DOMINANT SIDE Right    At Risk Side Left    BASELINE SCORE (UNILATERAL) 0.1              QUICK DASH SURVEY:2.27   PATIENT EDUCATION:  Education details: Lymphedema risk reduction and post op shoulder/posture HEP, SOZO screens, ABC  class Person educated: Patient Education method: Explanation, Demonstration, Handout Education comprehension: Patient  verbalized understanding and returned demonstration   HOME EXERCISE PROGRAM: Patient was instructed today in a home exercise program today for post op shoulder range of motion. These included active assist shoulder flexion in sitting, scapular retraction, wall walking with shoulder abduction, and hands behind head external rotation.  She was encouraged to do these twice a day, holding 3 seconds and repeating 5 times when permitted by her physician.   ASSESSMENT:  CLINICaL IMPRESSION: Her multidisciplinary medical team met prior to her assessments to determine a recommended treatment plan. She is planning to have a Left Lumpectomy with SLNB on 01/19/2022.Marland Kitchen She will benefit from a post op PT reassessment to determine needs and from L-Dex screens every 3 months for 2 years to detect subclinical lymphedema.  Pt will benefit from skilled therapeutic intervention to improve on the following deficits: Decreased knowledge of precautions, impaired UE functional use, pain, decreased ROM, postural dysfunction.   PT treatment/interventions: ADL/self-care home management, pt/family education, therapeutic exercise  REHAB POTENTIAL: Excellent  CLINICAL DECISION MAKING: Stable/uncomplicated  EVALUATION COMPLEXITY: Low   GOALS: Goals reviewed with patient? YES  LONG TERM GOALS: (STG=LTG)    Name Target Date Goal status  1 Pt will be able to verbalize understanding of pertinent lymphedema risk reduction practices relevant to her dx specifically related to skin care.  Baseline:  No knowledge 01/02/2022 Achieved at eval  2 Pt will be able to return demo and/or verbalize understanding of the post op HEP related to regaining shoulder ROM. Baseline:  No knowledge 01/02/2022 Achieved at eval  3 Pt will be able to verbalize understanding of the importance of attending the post op After Breast CA Class for further lymphedema risk reduction education and therapeutic exercise.  Baseline:  No knowledge 01/02/2022  Achieved at eval  4 Pt will demo she has regained full shoulder ROM and function post operatively compared to baselines.  Baseline: See objective measurements taken today. 02/28/2020 INITIAL     PLAN: PT FREQUENCY/DURATION: EVAL and 1 follow up appointment.   PLAN FOR NEXT SESSION: will reassess 3-4 weeks post op to determine needs. Schedule ABC, already scheduled for FU and next SOZO   Patient will follow up at outpatient cancer rehab 3-4 weeks following surgery.  If the patient requires physical therapy at that time, a specific plan will be dictated and sent to the referring physician for approval. The patient was educated today on appropriate basic range of motion exercises to begin post operatively and the importance of attending the After Breast Cancer class following surgery.  Patient was educated today on lymphedema risk reduction practices as it pertains to recommendations that will benefit the patient immediately following surgery.  She verbalized good understanding.    Physical Therapy Information for After Breast Cancer Surgery/Treatment:  Lymphedema is a swelling condition that you may be at risk for in your arm if you have lymph nodes removed from the armpit area.  After a sentinel node biopsy, the risk is approximately 5-9% and is higher after an axillary node dissection.  There is treatment available for this condition and it is not life-threatening.  Contact your physician or physical therapist with concerns. You may begin the 4 shoulder/posture exercises (see additional sheet) when permitted by your physician (typically a week after surgery).  If you have drains, you may need to wait until those are removed before beginning range of motion exercises.  A general recommendation is to not  lift your arms above shoulder height until drains are removed.  These exercises should be done to your tolerance and gently.  This is not a "no pain/no gain" type of recovery so listen to your body and  stretch into the range of motion that you can tolerate, stopping if you have pain.  If you are having immediate reconstruction, ask your plastic surgeon about doing exercises as he or she may want you to wait. We encourage you to attend the free one time ABC (After Breast Cancer) class offered by [facility].  You will learn information related to lymphedema risk, prevention and treatment and additional exercises to regain mobility following surgery.  You can call [phone] for more information.  This is offered the 1st and 3rd Monday of each month.  You only attend the class one time. While undergoing any medical procedure or treatment, try to avoid blood pressure being taken or needle sticks from occurring on the arm on the side of cancer.   This recommendation begins after surgery and continues for the rest of your life.  This may help reduce your risk of getting lymphedema (swelling in your arm). An excellent resource for those seeking information on lymphedema is the National Lymphedema Network's web site. It can be accessed at [place].org If you notice swelling in your hand, arm or breast at any time following surgery (even if it is many years from now), please contact your doctor or physical therapist to discuss this.  Lymphedema can be treated at any time but it is easier for you if it is treated early on.  If you feel like your shoulder motion is not returning to normal in a reasonable amount of time, please contact your surgeon or physical therapist.  [facility] [phone]. [address], [place] [address]  ABC CLASS After Breast Cancer Class  After Breast Cancer Class is a specially designed exercise class to assist you in a safe recover after having breast cancer surgery.  In this class you will learn how to get back to full function whether your drains were just removed or if you had surgery a month ago.  This  one-time class is held the 1st and 3rd Monday of every month from 11:00 a.m. until 12:00 noon virtually.  This class is FREE and space is limited. For more information or to register for the next available class, call [phone].  Class Goals  Understand specific stretches to improve the flexibility of you chest and shoulder. Learn ways to safely strengthen your upper body and improve your posture. Understand the warning signs of infection and why you may be at risk for an arm infection. Learn about Lymphedema and prevention.  ** You do not attend this class until after surgery.  Drains must be removed to participate  Patient was instructed today in a home exercise program today for post op shoulder range of motion. These included active assist shoulder flexion in sitting, scapular retraction, wall walking with shoulder abduction, and hands behind head external rotation.  She was encouraged to do these twice a day, holding 3 seconds and repeating 5 times when permitted by her physician.    Claris Pong, PT 01/02/2022, 8:55 AM

## 2022-01-02 ENCOUNTER — Telehealth: Payer: Self-pay | Admitting: *Deleted

## 2022-01-02 ENCOUNTER — Encounter: Payer: Self-pay | Admitting: *Deleted

## 2022-01-02 ENCOUNTER — Ambulatory Visit: Payer: 59 | Attending: Orthopaedic Surgery

## 2022-01-02 ENCOUNTER — Other Ambulatory Visit: Payer: Self-pay | Admitting: Surgery

## 2022-01-02 DIAGNOSIS — R293 Abnormal posture: Secondary | ICD-10-CM | POA: Insufficient documentation

## 2022-01-02 DIAGNOSIS — Z170 Estrogen receptor positive status [ER+]: Secondary | ICD-10-CM | POA: Diagnosis present

## 2022-01-02 DIAGNOSIS — R9389 Abnormal findings on diagnostic imaging of other specified body structures: Secondary | ICD-10-CM

## 2022-01-02 DIAGNOSIS — C50412 Malignant neoplasm of upper-outer quadrant of left female breast: Secondary | ICD-10-CM | POA: Diagnosis present

## 2022-01-02 NOTE — Telephone Encounter (Signed)
Called pt to provide navigation resources and contact information. Denies questions or concerns regarding dx or treatment care plan. Informed will be called with appt for MR bx today to be done prior to sx on 6/8. Received verbal understanding. Encourage pt to call with needs.

## 2022-01-03 ENCOUNTER — Other Ambulatory Visit: Payer: Self-pay | Admitting: Surgery

## 2022-01-03 DIAGNOSIS — C50912 Malignant neoplasm of unspecified site of left female breast: Secondary | ICD-10-CM

## 2022-01-05 ENCOUNTER — Encounter: Payer: Self-pay | Admitting: *Deleted

## 2022-01-06 ENCOUNTER — Ambulatory Visit
Admission: RE | Admit: 2022-01-06 | Discharge: 2022-01-06 | Disposition: A | Discharge status: Home / Self Care | Payer: 59 | Source: Ambulatory Visit | Attending: Surgery | Admitting: Surgery

## 2022-01-06 DIAGNOSIS — R9389 Abnormal findings on diagnostic imaging of other specified body structures: Secondary | ICD-10-CM

## 2022-01-06 IMAGING — MG MM BREAST LOCALIZATION CLIP
4 series · 4 of 12 positions shown · non-contrast
Comparison: Previous exam(s).

CLINICAL DATA: Patient is post MRI guided biopsy of linear non mass
enhancement extending anteriorly from known biopsy-proven malignancy
in the posterior upper outer left breast to the nipple as biopsy was
performed over the middle to anterior third of the breast along the
linear non mass enhancement.

EXAM:
3D DIAGNOSTIC LEFT MAMMOGRAM POST MRI BIOPSY.

[L ML synth-2D]
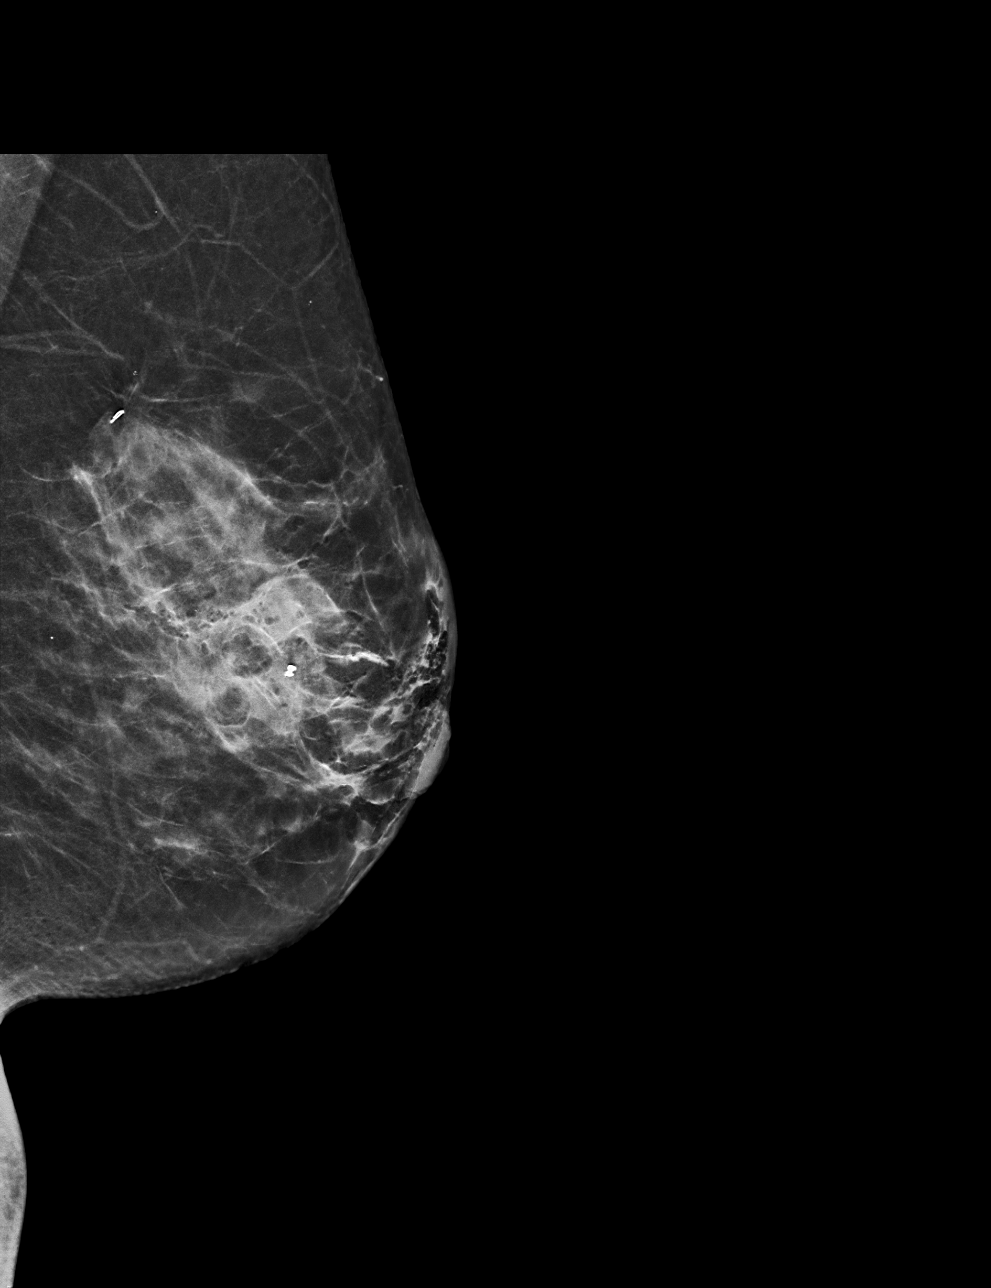

[L CC synth-2D]
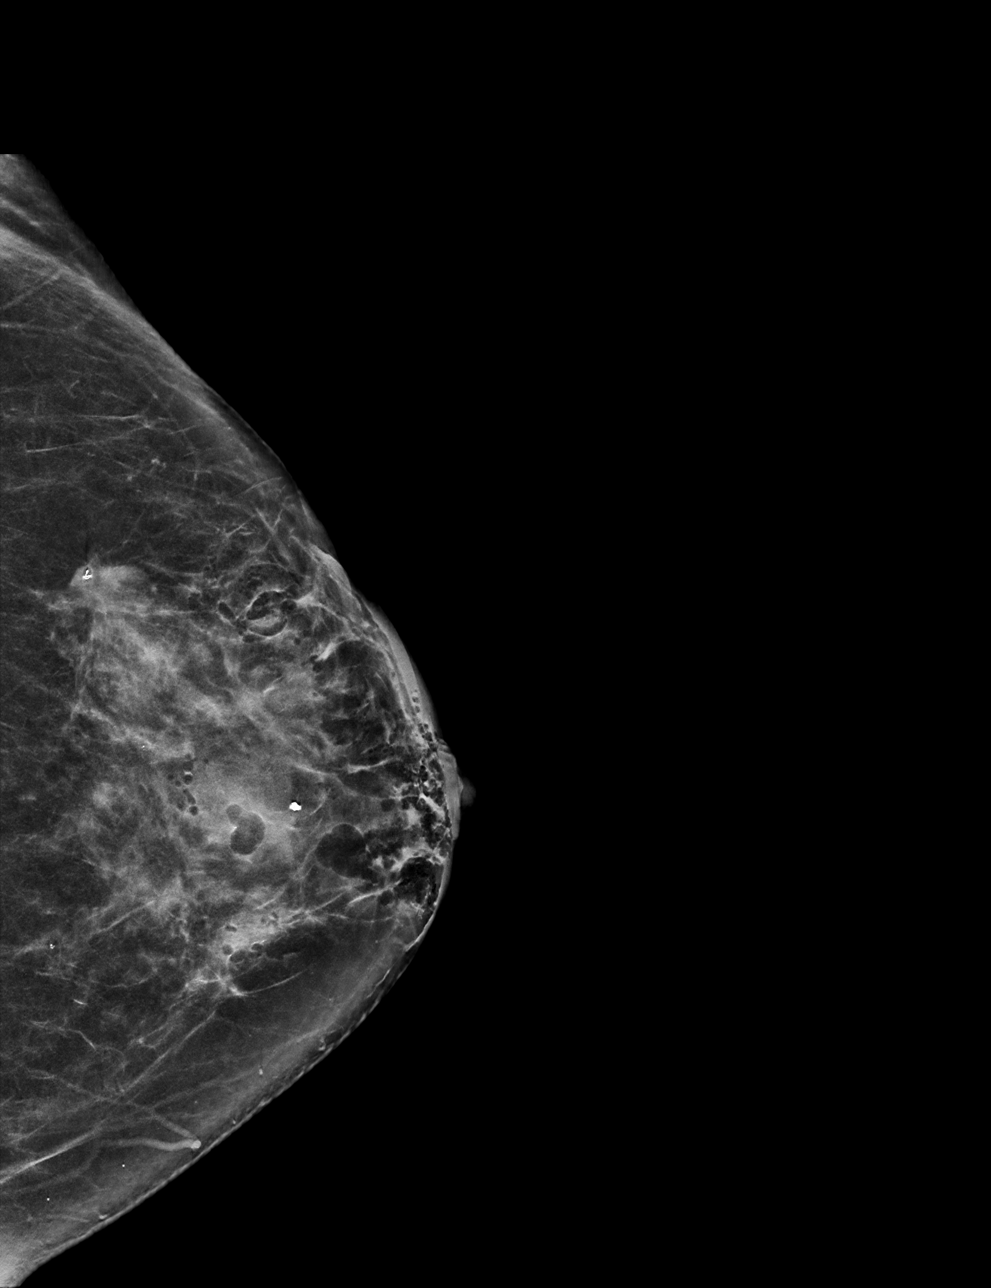

[L ML tomo · tomo slice 31/60.0]
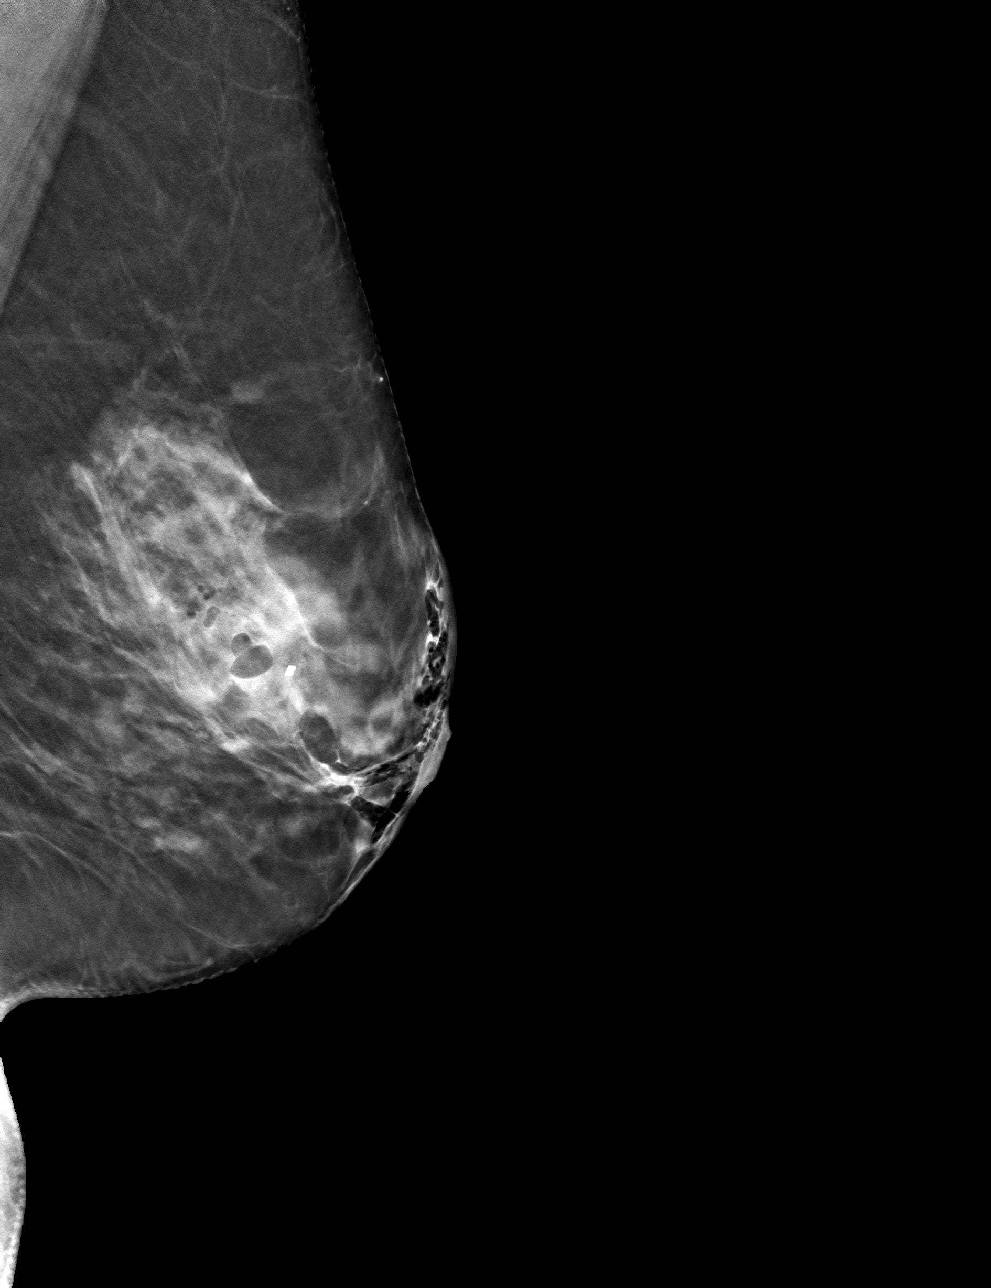

[L CC tomo · tomo slice 31/62.0]
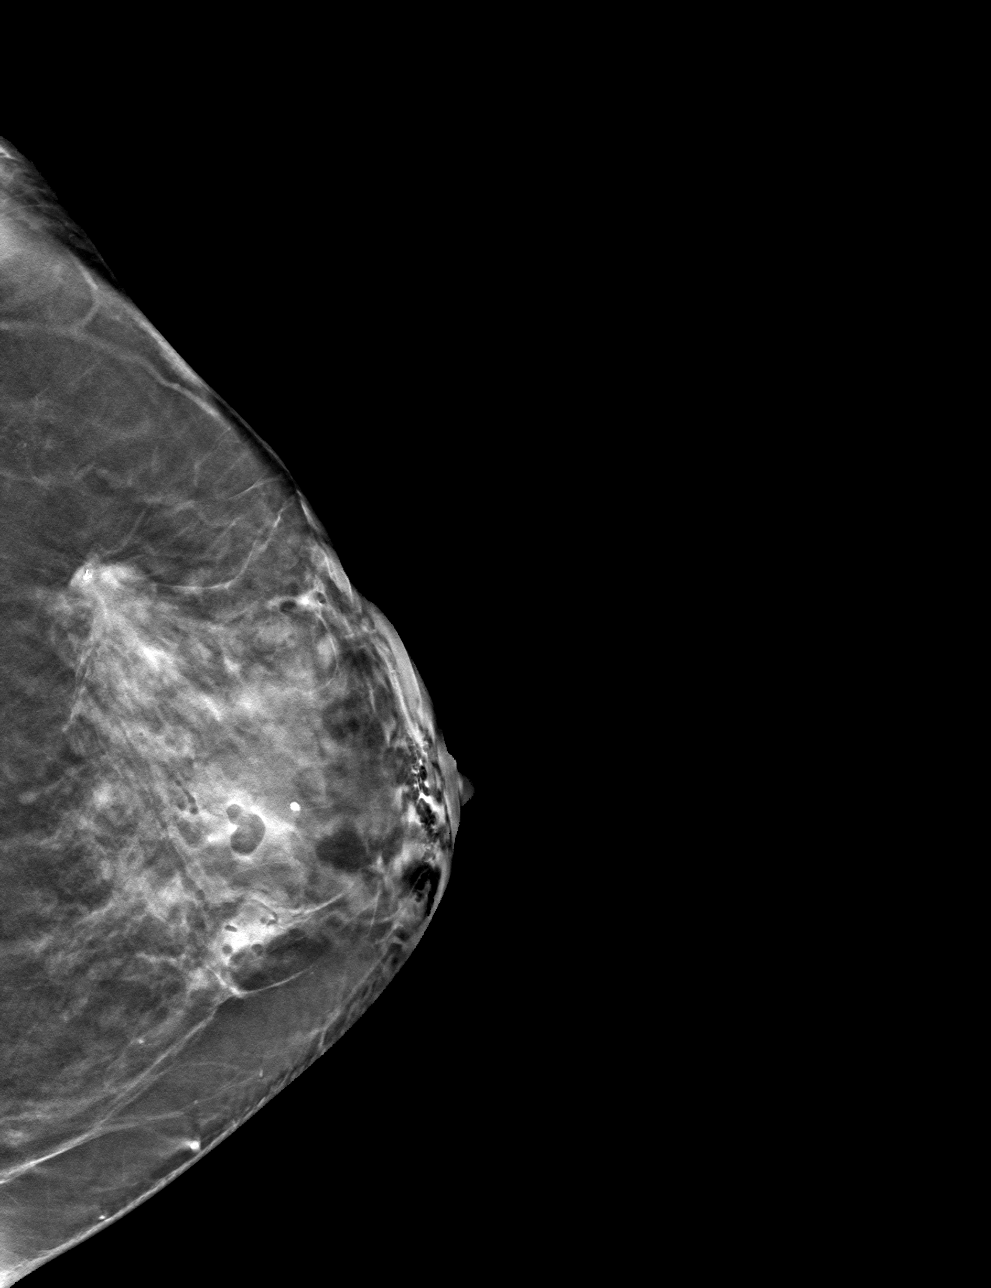

[4 of 12 positions shown; findings below may reference images not displayed]

FINDINGS: 3D Mammographic images were obtained following MRI guided biopsy of
the targeted linear non mass enhancement over the middle to anterior
third of the left central breast. The biopsy marking clip is in
expected position at the site of biopsy.
IMPRESSION: Appropriate positioning of the barbell shaped biopsy marking clip at
the site of biopsy in the middle to anterior third of the central
left breast.

Final Assessment: Post Procedure Mammograms for Marker Placement

## 2022-01-06 IMAGING — MR MR BREAST BX W LOC DEV 1ST LESION IMAGE BX SPEC MR GUIDE*L*
7 of 10 series · 32 of 48 positions shown · IV contrast (7ml gadavist)
Comparison: Previous exams.
COMPARISON: Previous exams.

Addendum:
CLINICAL DATA: Patient with recent diagnosis of left breast
invasive mammary carcinoma with invasive mammary carcinoma in situ
over the posterior upper outer quadrant. Patient presents for MRI
guided core needle biopsy of 6.8 cm region of linear non mass
enhancement extending from the known cancer anteriorly to the nipple
base. Biopsy will target the middle to anterior third of this linear
non mass enhancement.

EXAM:
MRI GUIDED CORE NEEDLE BIOPSY OF THE LEFT BREAST
TECHNIQUE: Multiplanar, multisequence MR imaging of the left breast was
performed both before and after administration of intravenous
contrast.
CONTRAST:  7 mL Gadavist.

[Series 2: fiducial unilateral · sagittal · 2.0mm · 1.33mm/px · 3 of 52 slices shown]
[im 1/52]
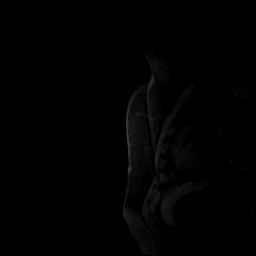
[im 26/52]
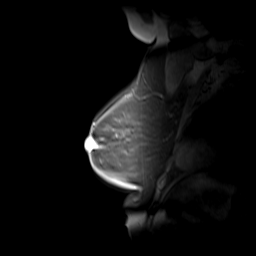
[im 52/52]
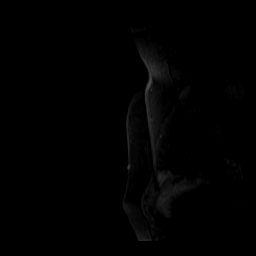

[Series 3: dynamic pre · axial · non-contrast · 1.3mm · 0.73mm/px · z∈[-92,+93]mm · 5 of 144 slices shown]
[im 1/144]
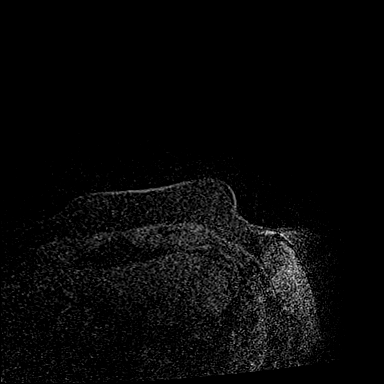
[im 36/144]
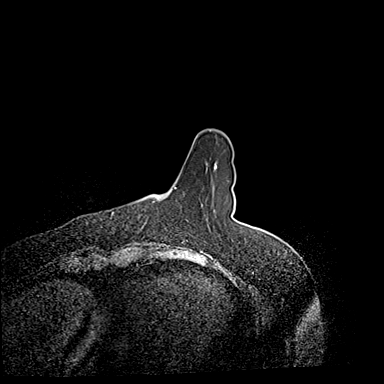
[im 72/144]
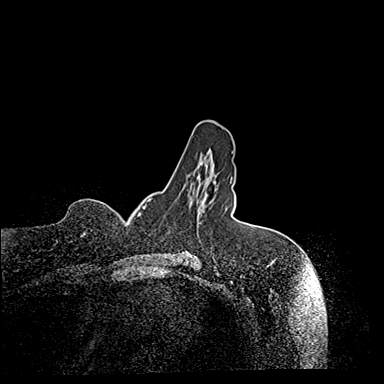
[im 108/144]
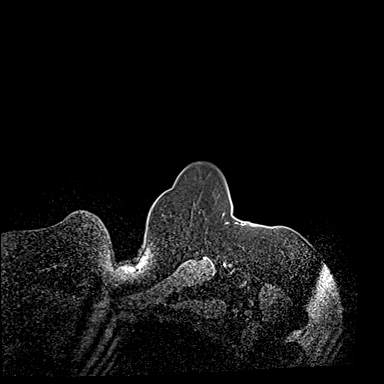
[im 144/144]
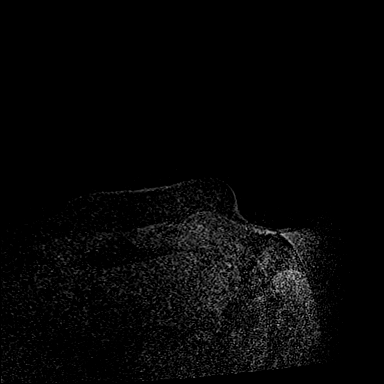

[Series 4: dynamic post 20 · axial · 1.3mm · 0.73mm/px · z∈[-92,+93]mm · 5 of 144 slices shown (1 of 2)]
[im 1/144]
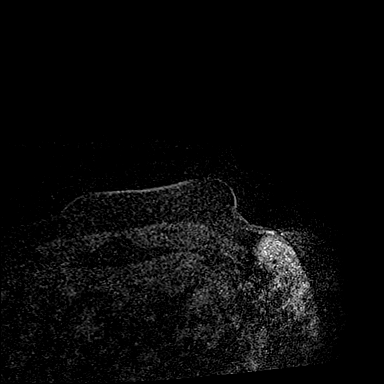
[im 36/144]
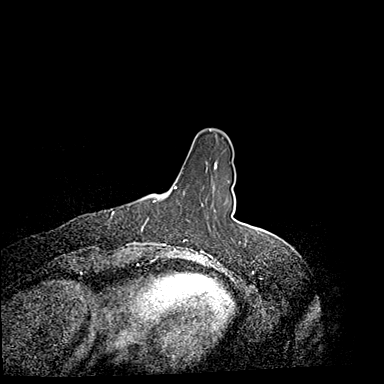
[im 72/144]
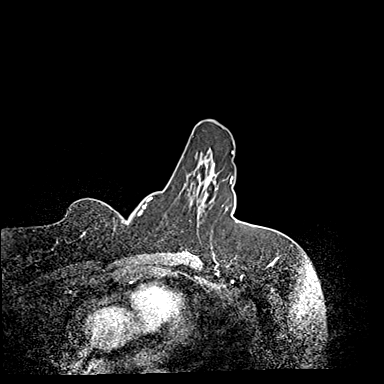
[im 108/144]
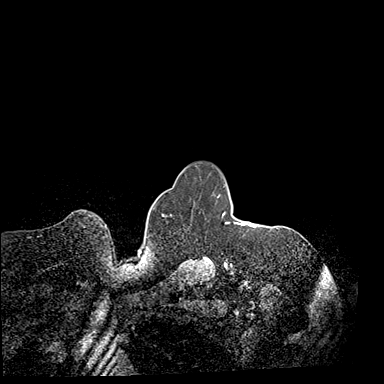
[im 144/144]
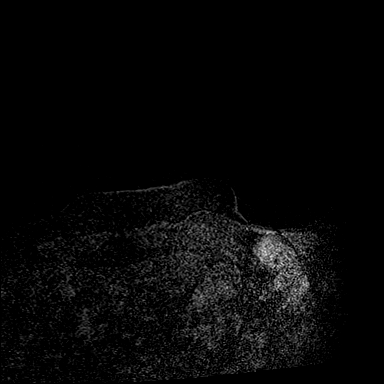

[Series 5: dynamic post 20 · axial · 1.3mm · 0.73mm/px · z∈[-92,+93]mm · 5 of 144 slices shown (2 of 2)]
[im 1/144]
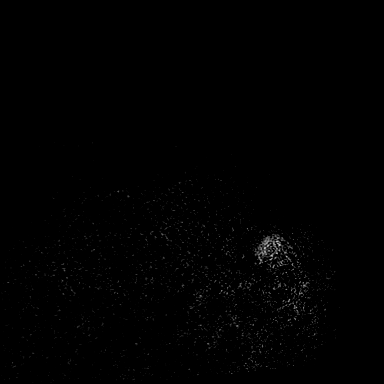
[im 36/144]
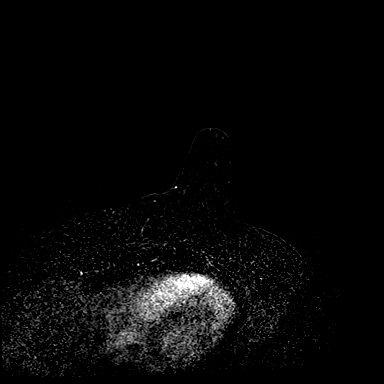
[im 72/144]
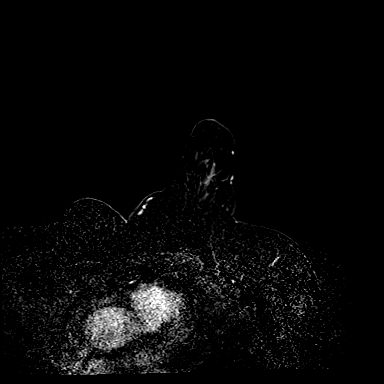
[im 108/144]
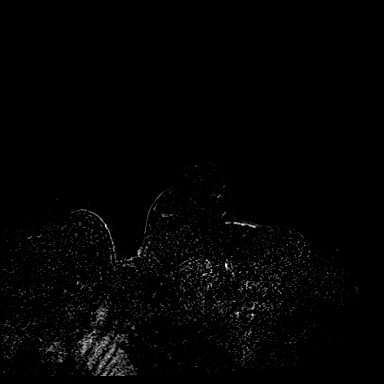
[im 144/144]
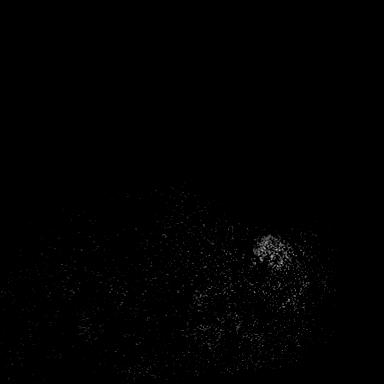

[Series 6: dynamic post 3 · axial · 1.3mm · 0.73mm/px · z∈[-92,+93]mm · 5 of 144 slices shown (1 of 2)]
[im 1/144]
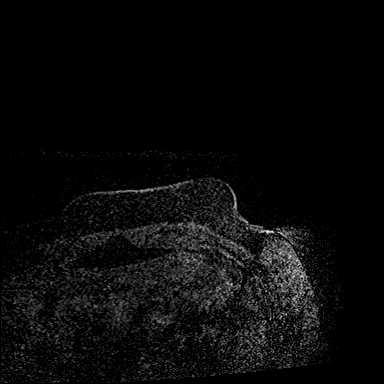
[im 36/144]
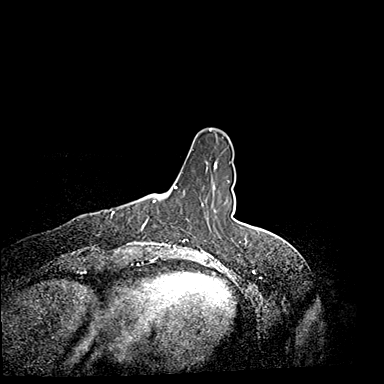
[im 72/144]
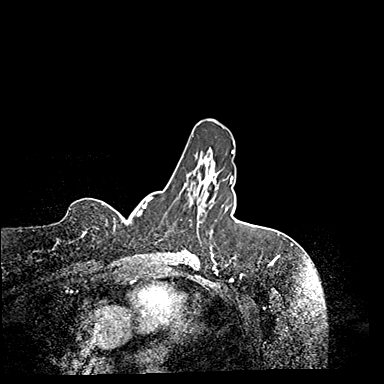
[im 108/144]
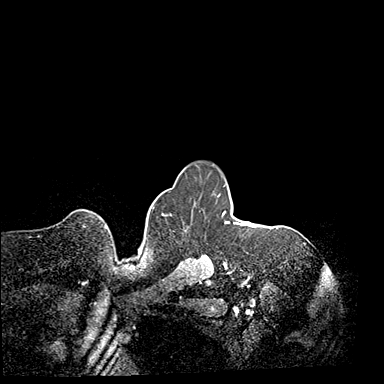
[im 144/144]
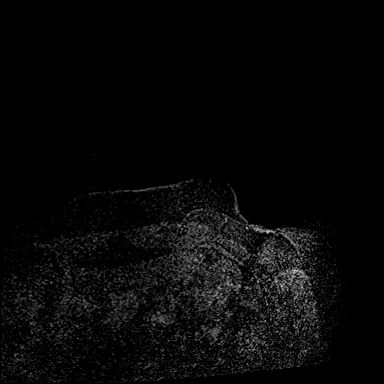

[Series 7: dynamic post 3 · axial · 1.3mm · 0.73mm/px · z∈[-92,+93]mm · 5 of 144 slices shown (2 of 2)]
[im 1/144]
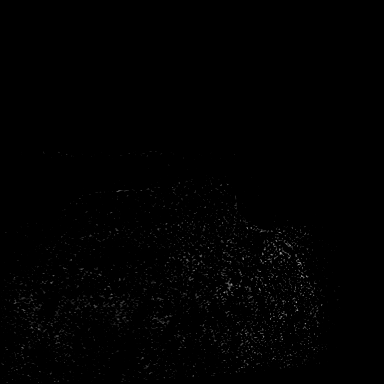
[im 36/144]
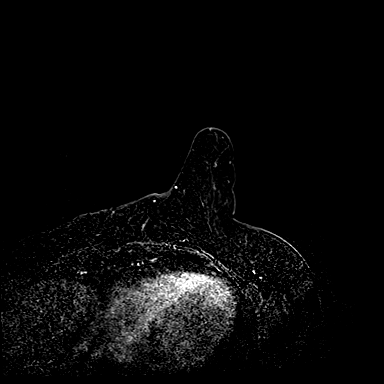
[im 72/144]
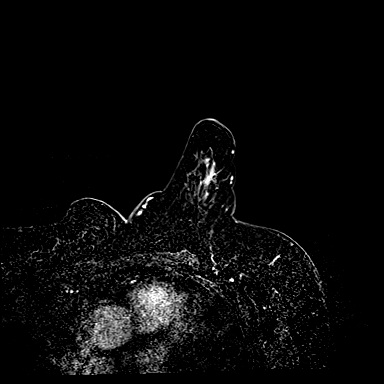
[im 108/144]
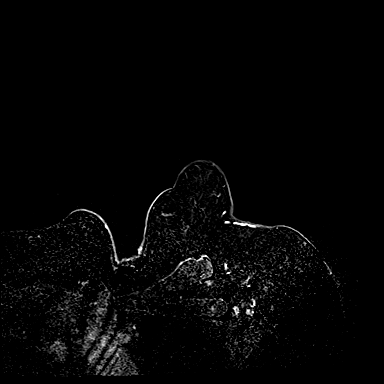
[im 144/144]
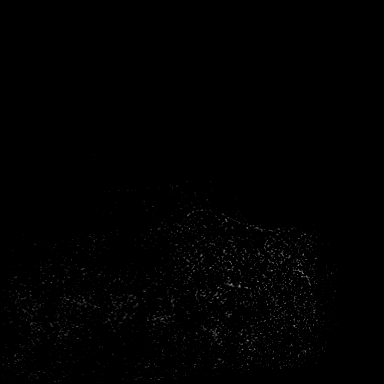

[Series 8: needle confirmation · axial · 1.3mm · 0.73mm/px · z∈[-92,+47]mm · 4 of 144 slices shown]
[im 1/144]
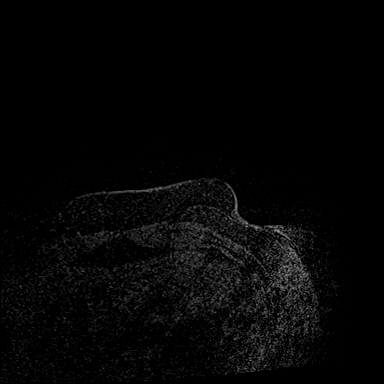
[im 36/144]
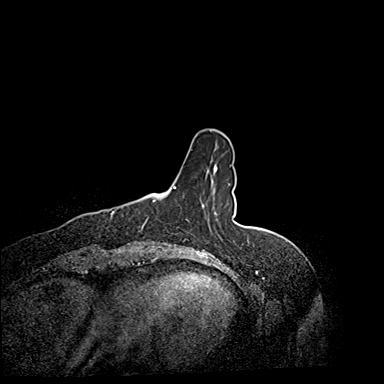
[im 72/144]
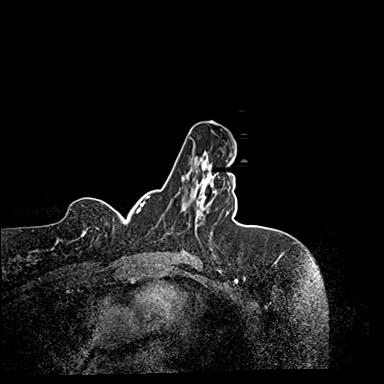
[im 108/144]
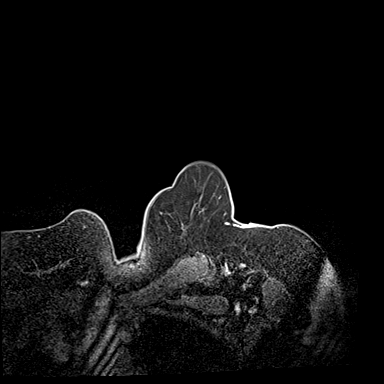

[32 of 48 positions shown; findings below may reference images not displayed]

FINDINGS: I met with the patient, and we discussed the procedure of MRI guided
biopsy, including risks, benefits, and alternatives. Specifically,
we discussed the risks of infection, bleeding, tissue injury, clip
migration, and inadequate sampling and possible allergic contrast
reaction. Informed, written consent was given. The usual time out
protocol was performed immediately prior to the procedure.

Using sterile technique, 1% Lidocaine, MRI guidance, and a 9 gauge
vacuum assisted device, biopsy was performed of the targeted 6.8 cm
linear non mass enhancement targeting the middle to anterior third
of this enhancement using a lateral to medial approach. At the
conclusion of the procedure, a barbell shaped tissue marker clip was
deployed into the biopsy cavity. Follow-up 2-view mammogram was
performed and dictated separately.
IMPRESSION: MRI guided biopsy of indeterminate left br[REDACTED]ar non mass
enhancement. No apparent complications.

ADDENDUM:
Pathology revealed GRADE I INVASIVE LOBULAR CARCINOMA, FIBROCYSTIC
CHANGES WITH FOCAL INTRA LUMINAL MICROCALCIFICATION AND USUAL
EPITHELIAL HYPERPLASIA of the LEFT breast, posterior upper outer
quadrant, (barbell clip). This was found to be concordant by Dr.
JOSHJAX.

Pathology results were discussed with the patient by telephone. The
patient reported doing well after the biopsy with tenderness at the
site. Post biopsy instructions and care were reviewed and questions
were answered. The patient was encouraged to call The [REDACTED] for any additional concerns. My direct phone
number was provided.

The patient has a recent diagnosis of LEFT breast cancer and should
follow her outlined treatment plan.

Dr. JOSHJAX was notified of biopsy results via [REDACTED] message on

Pathology results reported by JOSHJAX, RN on [DATE].

*** End of Addendum ***
FINDINGS: I met with the patient, and we discussed the procedure of MRI guided
biopsy, including risks, benefits, and alternatives. Specifically,
we discussed the risks of infection, bleeding, tissue injury, clip
migration, and inadequate sampling and possible allergic contrast
reaction. Informed, written consent was given. The usual time out
protocol was performed immediately prior to the procedure.

Using sterile technique, 1% Lidocaine, MRI guidance, and a 9 gauge
vacuum assisted device, biopsy was performed of the targeted 6.8 cm
linear non mass enhancement targeting the middle to anterior third
of this enhancement using a lateral to medial approach. At the
conclusion of the procedure, a barbell shaped tissue marker clip was
deployed into the biopsy cavity. Follow-up 2-view mammogram was
performed and dictated separately.
IMPRESSION: MRI guided biopsy of indeterminate left br[REDACTED]ar non mass
enhancement. No apparent complications.

## 2022-01-06 MED ORDER — GADOBUTROL 1 MMOL/ML IV SOLN
7.0000 mL | Freq: Once | INTRAVENOUS | Status: AC | PRN
  Administered 2022-01-06: 7 mL via INTRAVENOUS

## 2022-01-10 ENCOUNTER — Encounter: Payer: Self-pay | Admitting: *Deleted

## 2022-01-12 ENCOUNTER — Encounter: Payer: Self-pay | Admitting: *Deleted

## 2022-01-12 ENCOUNTER — Telehealth: Payer: Self-pay | Admitting: *Deleted

## 2022-01-12 DIAGNOSIS — Z170 Estrogen receptor positive status [ER+]: Secondary | ICD-10-CM

## 2022-01-12 NOTE — Telephone Encounter (Signed)
Spoke to pt concerning plastic surgery with mastectomy. Informed pt that she will discuss this in detail at her appt. She requested to also speak with Dr. Ninfa Linden about the specifics of mastectomy now that her tx plan has changed based off MRI bx results. Referral sent to Dr. Iran Planas Request sent to Dr. Ninfa Linden and his nurse for an appt. No further needs voiced at this time.

## 2022-01-17 ENCOUNTER — Other Ambulatory Visit: Payer: Self-pay | Admitting: Surgery

## 2022-01-20 ENCOUNTER — Other Ambulatory Visit: Payer: Self-pay

## 2022-01-20 ENCOUNTER — Encounter (HOSPITAL_BASED_OUTPATIENT_CLINIC_OR_DEPARTMENT_OTHER): Payer: Self-pay | Admitting: Surgery

## 2022-01-24 ENCOUNTER — Encounter (HOSPITAL_BASED_OUTPATIENT_CLINIC_OR_DEPARTMENT_OTHER)
Admission: RE | Admit: 2022-01-24 | Discharge: 2022-01-24 | Disposition: A | Discharge status: Home / Self Care | Payer: 59 | Source: Ambulatory Visit | Attending: Surgery | Admitting: Surgery

## 2022-01-24 DIAGNOSIS — Z01818 Encounter for other preprocedural examination: Secondary | ICD-10-CM | POA: Diagnosis present

## 2022-01-24 DIAGNOSIS — I10 Essential (primary) hypertension: Secondary | ICD-10-CM | POA: Insufficient documentation

## 2022-01-24 LAB — BASIC METABOLIC PANEL
Anion gap: 7 (ref 5–15)
BUN: 12 mg/dL (ref 6–20)
CO2: 24 mmol/L (ref 22–32)
Calcium: 9.2 mg/dL (ref 8.9–10.3)
Chloride: 109 mmol/L (ref 98–111)
Creatinine, Ser: 0.5 mg/dL (ref 0.44–1.00)
GFR, Estimated: >60 mL/min (ref >60)
Glucose, Bld: 96 mg/dL (ref 70–99)
Potassium: 3.9 mmol/L (ref 3.5–5.1)
Sodium: 140 mmol/L (ref 135–145)

## 2022-01-24 MED ORDER — ENSURE PRE-SURGERY PO LIQD: 296.0000 mL | Freq: Once | ORAL | Status: DC

## 2022-01-24 NOTE — Progress Notes (Signed)

## 2022-01-25 NOTE — H&P (Signed)
Subjective:     Patient ID: Autumn Wyatt is a 56 y.o. female.   HPI   Returns for follow up discussion breast reconstruction. Presented following screening MMG with left breast distortion. Diagnostic MMG/US showed a 1.1 x 1 x 0.8 cm mass at 2 o'clock LEFT breast 5 cmfn. Axilla normal. 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    MRI demonstrated known malignancy left UOQ measuring 1.3 cm.  Additional NME extends from the anterior aspect of the known malignancy to the base of the nipple, measuring a total of 6.8 cm. MR guided biopsy labeled left breast outer third NME showed ILC ER/PR+ Her2-. Given span of disease, mastectomy recommended.    Plan Oncotype on surgical sample. Declined genetics.   Current 32 B. Desires larger. Wt stable.   Lives with spouse. Two adult children.   Review of Systems Remainder 12 point review negative    Objective:   Physical Exam Cardiovascular:     Rate and Rhythm: Normal rate and regular rhythm.     Heart sounds: Normal heart sounds.  Pulmonary:     Effort: Pulmonary effort is normal.     Breath sounds: Normal breath sounds.  Lymphadenopathy:     Upper Body:     Right upper body: No axillary adenopathy.     Left upper body: No axillary adenopathy.     Breasts: pseudo ptosis bilateral left significant ecchymoses present SN to nipple R 22.5 L 22.5 cm BW R 18 L 18 cm CW 13 cm Nipple to IMF R 7 L 7 cm    Assessment:     Left breast ca UOQ ER+    Plan:     Plan left NSM with immediate tissue expander acellular dermis reconstruction.   Reviewed drains, OR length, hospital stay and recovery, limitations. Discussed process of expansion and implant based risks including rupture, imaging surveillance for silicone implants, infection requiring surgery or removal, contracture. Reviewed implants are not permanent devices and will require additional surgery. Reviewed asymmetry one can expect with unilateral implant based reconstruction.  Counseled expander placement at time of mastectomy does not preclude her from autologous reconstruction in future. Reviewed reconstruction will be asensate. Reviewed risks mastectomy flap necrosis requiring additional surgery. Reviewed imaging findings and that nipple biopsy will be performed. Reviewed final pathology that involves nipple would lead to recommendation excision nipple.     Discussed use of acellular dermis in reconstruction, cadaveric source, incorporation over several weeks, risk that if has seroma or infection can act as additional nidus for infection if not incorporated.    Discussed prepectoral vs sub pectoral reconstruction. Discussed with patient and benefit of this is no animation deformity, may be less pain. Risk may be more visible rippling over upper poles, greater need of ADM. Reviewed pre pectoral would require larger amount acellular dermis, more drains. Discussed any type reconstruction also risks long term displacement implant and visible rippling. If prepectoral counseled I would recommend she be comfortable with silicone implants as more options that have less rippling. She agrees to prepectoral placement.   Reviewed reconstruction will be asensate and not stimulate. Reviewed additional risks including but not limited to risks seroma, hematoma, asymmetry, need to additional procedures, fat necrosis, DVT/PE, damage to adjacent structures, cardiopulmonary complications.   Patient has two bras from [employer] [place]. She has Nycynta at home; reports nausea with oxycodone. Drain teaching completed. Rx for Robaxin and bactrim given.

## 2022-01-30 ENCOUNTER — Encounter: Payer: Self-pay | Admitting: *Deleted

## 2022-01-31 ENCOUNTER — Encounter (HOSPITAL_BASED_OUTPATIENT_CLINIC_OR_DEPARTMENT_OTHER)
Admission: RE | Disposition: A | Discharge status: Home / Self Care | Payer: Self-pay | Source: Home / Self Care | Attending: Plastic Surgery

## 2022-01-31 ENCOUNTER — Other Ambulatory Visit: Payer: Self-pay

## 2022-01-31 ENCOUNTER — Ambulatory Visit (HOSPITAL_BASED_OUTPATIENT_CLINIC_OR_DEPARTMENT_OTHER)
Admission: RE | Admit: 2022-01-31 | Discharge: 2022-02-01 | Disposition: A | Discharge status: Home / Self Care | Payer: 59 | Attending: Plastic Surgery | Admitting: Plastic Surgery

## 2022-01-31 ENCOUNTER — Encounter (HOSPITAL_BASED_OUTPATIENT_CLINIC_OR_DEPARTMENT_OTHER): Payer: Self-pay | Admitting: Surgery

## 2022-01-31 ENCOUNTER — Ambulatory Visit (HOSPITAL_BASED_OUTPATIENT_CLINIC_OR_DEPARTMENT_OTHER): Payer: 59 | Admitting: Anesthesiology

## 2022-01-31 DIAGNOSIS — C50412 Malignant neoplasm of upper-outer quadrant of left female breast: Secondary | ICD-10-CM | POA: Diagnosis present

## 2022-01-31 DIAGNOSIS — C50912 Malignant neoplasm of unspecified site of left female breast: Secondary | ICD-10-CM | POA: Diagnosis present

## 2022-01-31 DIAGNOSIS — Z170 Estrogen receptor positive status [ER+]: Secondary | ICD-10-CM | POA: Insufficient documentation

## 2022-01-31 DIAGNOSIS — I10 Essential (primary) hypertension: Secondary | ICD-10-CM | POA: Insufficient documentation

## 2022-01-31 HISTORY — PX: MASTECTOMY W/ SENTINEL NODE BIOPSY: SHX2001

## 2022-01-31 HISTORY — PX: BREAST RECONSTRUCTION WITH PLACEMENT OF TISSUE EXPANDER AND ALLODERM: SHX6805

## 2022-01-31 HISTORY — DX: Essential (primary) hypertension: I10

## 2022-01-31 LAB — POCT PREGNANCY, URINE: Preg Test, Ur: NEGATIVE

## 2022-01-31 SURGERY — MASTECTOMY WITH SENTINEL LYMPH NODE BIOPSY
Anesthesia: Regional | Site: Breast | Laterality: Left

## 2022-01-31 MED ORDER — ONDANSETRON HCL 4 MG/2ML IJ SOLN
INTRAMUSCULAR | Status: DC | PRN
  Administered 2022-01-31: 4 mg via INTRAVENOUS

## 2022-01-31 MED ORDER — ONDANSETRON 4 MG PO TBDP: 4.0000 mg | ORAL_TABLET | Freq: Four times a day (QID) | ORAL | Status: DC | PRN

## 2022-01-31 MED ORDER — CEFAZOLIN SODIUM-DEXTROSE 2-4 GM/100ML-% IV SOLN
INTRAVENOUS | Status: AC
Start: 2022-01-31 — End: ?
  Filled 2022-01-31: qty 100

## 2022-01-31 MED ORDER — DEXMEDETOMIDINE (PRECEDEX) IN NS 20 MCG/5ML (4 MCG/ML) IV SYRINGE
PREFILLED_SYRINGE | INTRAVENOUS | Status: AC
  Filled 2022-01-31: qty 5

## 2022-01-31 MED ORDER — POVIDONE-IODINE 10 % EX SOLN
CUTANEOUS | Status: DC | PRN
  Administered 2022-01-31: 1 via TOPICAL

## 2022-01-31 MED ORDER — ONDANSETRON HCL 4 MG/2ML IJ SOLN
INTRAMUSCULAR | Status: AC
  Filled 2022-01-31: qty 2

## 2022-01-31 MED ORDER — ACETAMINOPHEN 500 MG PO TABS
1000.0000 mg | ORAL_TABLET | ORAL | Status: AC
  Administered 2022-01-31: 1000 mg via ORAL

## 2022-01-31 MED ORDER — PROPOFOL 10 MG/ML IV BOLUS
INTRAVENOUS | Status: DC | PRN
  Administered 2022-01-31: 140 mg via INTRAVENOUS

## 2022-01-31 MED ORDER — TRAMADOL HCL 50 MG PO TABS: 50.0000 mg | ORAL_TABLET | Freq: Four times a day (QID) | ORAL | Status: DC | PRN

## 2022-01-31 MED ORDER — SODIUM CHLORIDE 0.9 % IV SOLN
INTRAVENOUS | Status: DC | PRN
  Administered 2022-01-31: 500 mL

## 2022-01-31 MED ORDER — KETOROLAC TROMETHAMINE 30 MG/ML IJ SOLN: 30.0000 mg | Freq: Once | INTRAMUSCULAR | Status: DC | PRN

## 2022-01-31 MED ORDER — CEFAZOLIN SODIUM-DEXTROSE 1-4 GM/50ML-% IV SOLN
1.0000 g | Freq: Three times a day (TID) | INTRAVENOUS | Status: DC
  Administered 2022-01-31 – 2022-02-01 (×2): 1 g via INTRAVENOUS
  Filled 2022-01-31 (×2): qty 50

## 2022-01-31 MED ORDER — GENTAMICIN SULFATE 40 MG/ML IJ SOLN
INTRAMUSCULAR | Status: AC
  Filled 2022-01-31: qty 10

## 2022-01-31 MED ORDER — EPHEDRINE SULFATE (PRESSORS) 50 MG/ML IJ SOLN
INTRAMUSCULAR | Status: DC | PRN
  Administered 2022-01-31 (×2): 10 mg via INTRAVENOUS

## 2022-01-31 MED ORDER — FENTANYL CITRATE (PF) 100 MCG/2ML IJ SOLN
25.0000 ug | INTRAMUSCULAR | Status: DC | PRN
  Administered 2022-01-31: 50 ug via INTRAVENOUS

## 2022-01-31 MED ORDER — MAGTRACE LYMPHATIC TRACER
INTRAMUSCULAR | Status: DC | PRN
  Administered 2022-01-31: 2 mL via INTRAMUSCULAR

## 2022-01-31 MED ORDER — FENTANYL CITRATE (PF) 100 MCG/2ML IJ SOLN
INTRAMUSCULAR | Status: AC
  Filled 2022-01-31: qty 2

## 2022-01-31 MED ORDER — KETOROLAC TROMETHAMINE 30 MG/ML IJ SOLN
30.0000 mg | Freq: Three times a day (TID) | INTRAMUSCULAR | Status: DC
  Administered 2022-01-31 – 2022-02-01 (×2): 30 mg via INTRAVENOUS
  Filled 2022-01-31 (×2): qty 1

## 2022-01-31 MED ORDER — MIDAZOLAM HCL 2 MG/2ML IJ SOLN
INTRAMUSCULAR | Status: AC
  Filled 2022-01-31: qty 2

## 2022-01-31 MED ORDER — 0.9 % SODIUM CHLORIDE (POUR BTL) OPTIME
TOPICAL | Status: DC | PRN
  Administered 2022-01-31: 1000 mL

## 2022-01-31 MED ORDER — HEMOSTATIC AGENTS (NO CHARGE) OPTIME
TOPICAL | Status: DC | PRN
  Administered 2022-01-31: 1 via TOPICAL

## 2022-01-31 MED ORDER — PROPOFOL 500 MG/50ML IV EMUL
INTRAVENOUS | Status: DC | PRN
  Administered 2022-01-31: 25 ug/kg/min via INTRAVENOUS

## 2022-01-31 MED ORDER — CHLORHEXIDINE GLUCONATE CLOTH 2 % EX PADS: 6.0000 | MEDICATED_PAD | Freq: Once | CUTANEOUS | Status: DC

## 2022-01-31 MED ORDER — ONDANSETRON HCL 4 MG/2ML IJ SOLN: 4.0000 mg | Freq: Four times a day (QID) | INTRAMUSCULAR | Status: DC | PRN

## 2022-01-31 MED ORDER — DEXAMETHASONE SODIUM PHOSPHATE 10 MG/ML IJ SOLN
INTRAMUSCULAR | Status: DC | PRN
  Administered 2022-01-31: 10 mg via INTRAVENOUS

## 2022-01-31 MED ORDER — FENTANYL CITRATE (PF) 100 MCG/2ML IJ SOLN
100.0000 ug | Freq: Once | INTRAMUSCULAR | Status: AC
  Administered 2022-01-31: 100 ug via INTRAVENOUS

## 2022-01-31 MED ORDER — HYDROMORPHONE HCL 1 MG/ML IJ SOLN: 0.5000 mg | INTRAMUSCULAR | Status: DC | PRN

## 2022-01-31 MED ORDER — ACETAMINOPHEN 325 MG RE SUPP: 650.0000 mg | Freq: Four times a day (QID) | RECTAL | Status: DC | PRN

## 2022-01-31 MED ORDER — PROPOFOL 10 MG/ML IV BOLUS
INTRAVENOUS | Status: AC
Start: 2022-01-31 — End: ?
  Filled 2022-01-31: qty 20

## 2022-01-31 MED ORDER — LACTATED RINGERS IV SOLN: INTRAVENOUS | Status: DC

## 2022-01-31 MED ORDER — FENTANYL CITRATE (PF) 100 MCG/2ML IJ SOLN
INTRAMUSCULAR | Status: DC | PRN
  Administered 2022-01-31: 25 ug via INTRAVENOUS
  Administered 2022-01-31: 50 ug via INTRAVENOUS
  Administered 2022-01-31: 25 ug via INTRAVENOUS

## 2022-01-31 MED ORDER — METHOCARBAMOL 500 MG PO TABS
500.0000 mg | ORAL_TABLET | Freq: Four times a day (QID) | ORAL | Status: DC | PRN
  Administered 2022-01-31 (×2): 500 mg via ORAL
  Filled 2022-01-31 (×2): qty 1

## 2022-01-31 MED ORDER — ENOXAPARIN SODIUM 30 MG/0.3ML IJ SOSY: 30.0000 mg | PREFILLED_SYRINGE | INTRAMUSCULAR | Status: DC

## 2022-01-31 MED ORDER — ACETAMINOPHEN 325 MG PO TABS
650.0000 mg | ORAL_TABLET | Freq: Four times a day (QID) | ORAL | Status: DC | PRN
  Administered 2022-02-01: 650 mg via ORAL
  Filled 2022-01-31: qty 2

## 2022-01-31 MED ORDER — EPHEDRINE 5 MG/ML INJ
INTRAVENOUS | Status: AC
Start: 2022-01-31 — End: ?
  Filled 2022-01-31: qty 5

## 2022-01-31 MED ORDER — ACETAMINOPHEN 500 MG PO TABS
ORAL_TABLET | ORAL | Status: AC
  Filled 2022-01-31: qty 2

## 2022-01-31 MED ORDER — KCL IN DEXTROSE-NACL 20-5-0.45 MEQ/L-%-% IV SOLN
INTRAVENOUS | Status: DC
  Filled 2022-01-31: qty 1000

## 2022-01-31 MED ORDER — BUPIVACAINE-EPINEPHRINE (PF) 0.5% -1:200000 IJ SOLN
INTRAMUSCULAR | Status: DC | PRN
  Administered 2022-01-31: 30 mL via PERINEURAL

## 2022-01-31 MED ORDER — AMISULPRIDE (ANTIEMETIC) 5 MG/2ML IV SOLN
INTRAVENOUS | Status: AC
Start: 2022-01-31 — End: ?
  Filled 2022-01-31: qty 4

## 2022-01-31 MED ORDER — LIDOCAINE HCL (CARDIAC) PF 100 MG/5ML IV SOSY
PREFILLED_SYRINGE | INTRAVENOUS | Status: DC | PRN
  Administered 2022-01-31: 40 mg via INTRATRACHEAL

## 2022-01-31 MED ORDER — AMISULPRIDE (ANTIEMETIC) 5 MG/2ML IV SOLN
10.0000 mg | Freq: Once | INTRAVENOUS | Status: AC | PRN
  Administered 2022-01-31: 10 mg via INTRAVENOUS

## 2022-01-31 MED ORDER — CEFAZOLIN SODIUM-DEXTROSE 2-4 GM/100ML-% IV SOLN
2.0000 g | INTRAVENOUS | Status: AC
  Administered 2022-01-31: 2 g via INTRAVENOUS

## 2022-01-31 SURGICAL SUPPLY — 87 items
ALLOGRAFT PERF 16X20 1.6+/-0.4 (Tissue) ×1 IMPLANT
APPLIER CLIP 9.375 MED OPEN (MISCELLANEOUS) ×2
BAG DECANTER FOR FLEXI CONT (MISCELLANEOUS) ×2 IMPLANT
BINDER BREAST LRG (GAUZE/BANDAGES/DRESSINGS) ×1 IMPLANT
BINDER BREAST MEDIUM (GAUZE/BANDAGES/DRESSINGS) IMPLANT
BINDER BREAST XLRG (GAUZE/BANDAGES/DRESSINGS) IMPLANT
BINDER BREAST XXLRG (GAUZE/BANDAGES/DRESSINGS) IMPLANT
BLADE CLIPPER SURG (BLADE) IMPLANT
BLADE SURG 10 STRL SS (BLADE) ×2 IMPLANT
BLADE SURG 15 STRL LF DISP TIS (BLADE) ×1 IMPLANT
BLADE SURG 15 STRL SS (BLADE) ×4
BNDG GAUZE DERMACEA FLUFF (GAUZE/BANDAGES/DRESSINGS)
BNDG GAUZE DERMACEA FLUFF 4 (GAUZE/BANDAGES/DRESSINGS) IMPLANT
CANISTER SUCT 1200ML W/VALVE (MISCELLANEOUS) ×2 IMPLANT
CHLORAPREP W/TINT 26 (MISCELLANEOUS) ×4 IMPLANT
CLIP APPLIE 9.375 MED OPEN (MISCELLANEOUS) IMPLANT
COVER BACK TABLE 60X90IN (DRAPES) ×2 IMPLANT
COVER MAYO STAND STRL (DRAPES) ×4 IMPLANT
COVER PROBE W GEL 5X96 (DRAPES) ×2 IMPLANT
DERMABOND ADVANCED (GAUZE/BANDAGES/DRESSINGS) ×1
DERMABOND ADVANCED .7 DNX12 (GAUZE/BANDAGES/DRESSINGS) ×1 IMPLANT
DRAIN CHANNEL 15F RND FF W/TCR (WOUND CARE) ×1 IMPLANT
DRAIN CHANNEL 19F RND (DRAIN) ×1 IMPLANT
DRAPE INCISE IOBAN 66X45 STRL (DRAPES) ×1 IMPLANT
DRAPE LAPAROSCOPIC ABDOMINAL (DRAPES) ×1 IMPLANT
DRAPE TOP ARMCOVERS (MISCELLANEOUS) ×2 IMPLANT
DRAPE U-SHAPE 76X120 STRL (DRAPES) ×2 IMPLANT
DRAPE UTILITY XL STRL (DRAPES) ×3 IMPLANT
DRSG PAD ABDOMINAL 8X10 ST (GAUZE/BANDAGES/DRESSINGS) ×4 IMPLANT
DRSG TEGADERM 2-3/8X2-3/4 SM (GAUZE/BANDAGES/DRESSINGS) IMPLANT
DRSG TEGADERM 4X10 (GAUZE/BANDAGES/DRESSINGS) ×1 IMPLANT
DRSG TEGADERM 4X4.75 (GAUZE/BANDAGES/DRESSINGS) ×1 IMPLANT
ELECT BLADE 4.0 EZ CLEAN MEGAD (MISCELLANEOUS)
ELECT COATED BLADE 2.86 ST (ELECTRODE) ×2 IMPLANT
ELECT REM PT RETURN 9FT ADLT (ELECTROSURGICAL) ×2
ELECTRODE BLDE 4.0 EZ CLN MEGD (MISCELLANEOUS) ×1 IMPLANT
ELECTRODE REM PT RTRN 9FT ADLT (ELECTROSURGICAL) ×1 IMPLANT
EVACUATOR SILICONE 100CC (DRAIN) ×2 IMPLANT
EXPANDER TISSUE FV FOURTE 400 (Prosthesis & Implant Plastic) IMPLANT
GAUZE SPONGE 4X4 12PLY STRL LF (GAUZE/BANDAGES/DRESSINGS) IMPLANT
GLOVE BIO SURGEON STRL SZ 6 (GLOVE) ×5 IMPLANT
GLOVE SURG SIGNA 7.5 PF LTX (GLOVE) ×2 IMPLANT
GOWN STRL REUS W/ TWL LRG LVL3 (GOWN DISPOSABLE) ×2 IMPLANT
GOWN STRL REUS W/ TWL XL LVL3 (GOWN DISPOSABLE) ×1 IMPLANT
GOWN STRL REUS W/TWL LRG LVL3 (GOWN DISPOSABLE) ×4
GOWN STRL REUS W/TWL XL LVL3 (GOWN DISPOSABLE) ×2
HEMOSTAT ARISTA ABSORB 3G PWDR (HEMOSTASIS) ×1 IMPLANT
HEMOSTAT SURGICEL 2X14 (HEMOSTASIS) IMPLANT
LIGHT WAVEGUIDE WIDE FLAT (MISCELLANEOUS) ×2 IMPLANT
MARKER SKIN DUAL TIP RULER LAB (MISCELLANEOUS) IMPLANT
NDL HYPO 25X1 1.5 SAFETY (NEEDLE) IMPLANT
NDL SAFETY ECLIPSE 18X1.5 (NEEDLE) ×1 IMPLANT
NEEDLE HYPO 18GX1.5 SHARP (NEEDLE) ×2
NEEDLE HYPO 25X1 1.5 SAFETY (NEEDLE) IMPLANT
NS IRRIG 1000ML POUR BTL (IV SOLUTION) ×2 IMPLANT
PACK BASIN DAY SURGERY FS (CUSTOM PROCEDURE TRAY) ×2 IMPLANT
PENCIL SMOKE EVACUATOR (MISCELLANEOUS) ×2 IMPLANT
PIN SAFETY STERILE (MISCELLANEOUS) ×2 IMPLANT
PUNCH BIOPSY DERMAL 4MM (MISCELLANEOUS) IMPLANT
SHEET MEDIUM DRAPE 40X70 STRL (DRAPES) ×2 IMPLANT
SLEEVE SCD COMPRESS KNEE MED (STOCKING) ×2 IMPLANT
SPIKE FLUID TRANSFER (MISCELLANEOUS) IMPLANT
SPONGE T-LAP 18X18 ~~LOC~~+RFID (SPONGE) ×5 IMPLANT
STAPLER VISISTAT 35W (STAPLE) ×2 IMPLANT
SUT CHROMIC 4 0 PS 2 18 (SUTURE) ×3 IMPLANT
SUT ETHILON 2 0 FS 18 (SUTURE) ×2 IMPLANT
SUT ETHILON 3 0 PS 1 (SUTURE) ×1 IMPLANT
SUT MNCRL AB 4-0 PS2 18 (SUTURE) ×2 IMPLANT
SUT PDS AB 2-0 CT2 27 (SUTURE) IMPLANT
SUT SILK 2 0 SH (SUTURE) IMPLANT
SUT VIC AB 3-0 SH 27 (SUTURE) ×2
SUT VIC AB 3-0 SH 27X BRD (SUTURE) ×1 IMPLANT
SUT VICRYL 0 CT-2 (SUTURE) ×2 IMPLANT
SUT VICRYL 4-0 PS2 18IN ABS (SUTURE) ×1 IMPLANT
SUT VLOC 180 0 24IN GS25 (SUTURE) ×1 IMPLANT
SYR 10ML LL (SYRINGE) ×1 IMPLANT
SYR 50ML LL SCALE MARK (SYRINGE) ×2 IMPLANT
SYR BULB IRRIG 60ML STRL (SYRINGE) ×2 IMPLANT
SYR CONTROL 10ML LL (SYRINGE) IMPLANT
TAPE MEASURE VINYL STERILE (MISCELLANEOUS) IMPLANT
TISSUE EXPNDR FV FOURTE 400 (Prosthesis & Implant Plastic) ×2 IMPLANT
TOWEL GREEN STERILE FF (TOWEL DISPOSABLE) ×4 IMPLANT
TRACER MAGTRACE VIAL (MISCELLANEOUS) ×1 IMPLANT
TRAY DSU PREP LF (CUSTOM PROCEDURE TRAY) ×1 IMPLANT
TUBE CONNECTING 20X1/4 (TUBING) ×2 IMPLANT
UNDERPAD 30X36 HEAVY ABSORB (UNDERPADS AND DIAPERS) ×4 IMPLANT
YANKAUER SUCT BULB TIP NO VENT (SUCTIONS) ×2 IMPLANT

## 2022-01-31 NOTE — Anesthesia Procedure Notes (Signed)
Procedure Name: LMA Insertion Date/Time: 01/31/2022 12:02 PM  Performed by: Glory Buff, CRNAPre-anesthesia Checklist: Patient identified, Emergency Drugs available, Suction available and Patient being monitored Patient Re-evaluated:Patient Re-evaluated prior to induction Oxygen Delivery Method: Circle system utilized Preoxygenation: Pre-oxygenation with 100% oxygen Induction Type: IV induction LMA: LMA inserted LMA Size: 4.0 Number of attempts: 1 Placement Confirmation: positive ETCO2 Tube secured with: Tape Dental Injury: Teeth and Oropharynx as per pre-operative assessment

## 2022-01-31 NOTE — Anesthesia Postprocedure Evaluation (Signed)
Anesthesia Post Note  Patient: Autumn Wyatt  Procedure(s) Performed: LEFT NIPPLE SPARING MASTECTOMY AND SENTINEL LYMPH NODE BIOPSY (Left: Breast) BREAST RECONSTRUCTION WITH PLACEMENT OF TISSUE EXPANDER AND ALLODERM (Left: Breast)     Patient location during evaluation: PACU Anesthesia Type: Regional and General Level of consciousness: awake Pain management: pain level controlled Vital Signs Assessment: post-procedure vital signs reviewed and stable Respiratory status: spontaneous breathing, nonlabored ventilation, respiratory function stable and patient connected to nasal cannula oxygen Cardiovascular status: blood pressure returned to baseline and stable Postop Assessment: no apparent nausea or vomiting Anesthetic complications: no   No notable events documented.  Last Vitals:  Vitals:   01/31/22 1600 01/31/22 2000  BP: 137/84 121/78  Pulse: 97 84  Resp: 16 16  Temp: 36.7 C 36.7 C  SpO2: 99% 97%    Last Pain:  Vitals:   01/31/22 2000  TempSrc:   PainSc: 0-No pain                 [name] P Finnleigh Marchetti

## 2022-01-31 NOTE — Op Note (Signed)
Operative Note   DATE OF OPERATION: 6.20.23  LOCATION: [facility]  SURGICAL DIVISION: Plastic Surgery  PREOPERATIVE DIAGNOSES:  1. Left breast ca UOQ ER+  POSTOPERATIVE DIAGNOSES:  same  PROCEDURE:  1. Left breast reconstruction with tissue expander 2. Acellular dermis to left chest (Alloderm)  SURGEON: Irene Limbo MD MBA  ASSISTANT: none  ANESTHESIA:  General.   EBL: 100 ml for entire procedure  COMPLICATIONS: None immediate.   INDICATIONS FOR PROCEDURE:  The patient, Autumn Wyatt, is a 56 y.o. female born on [birth year], is here for immediate prepectoral tissue expander based reconstruction following nipple sparing mastectomy.   FINDINGS: Natrelle 133S FV-12-T 400 ml tissue expander placed initial fill volume 180 ml air SN [id number]  DESCRIPTION OF PROCEDURE:  The patient was marked with the patient in the preoperative area to mark sternal notch, chest midline, anterior axillary line and inframammary fold. The patient's operative site was prepped and draped in a sterile fashion. A time out was performed and all information was confirmed to be correct. I assisted in mastectomy with exposure and retraction. Following completion of mastectomy, hemostasis ensured. Cavity irrigated with saline solution containing Ancef, gentamicin, and Betadine. A 19 Fr drain was placed in subcutaneous position laterally and a 15 Fr drain placed along inframammary fold. Each secured to skin with 2-0 nylon. The tissue expander was prepared on back table prior in insertion. The expander was filled with air. Perforated acellular dermis was draped over anterior surface expander. The ADM was then secured to itself over posterior surface of expander with 4-0 chromic. Redundant folds acellular dermis excised so that the ADM lay flat without folds over air filled expander. The expander was secured to fascia over lateral sternal border with a 0 vicryl. The lateral tab was also secured to pectoralis  muscle with 0-vicryl. The ADM was secured to pectoralis muscle and chest wall along inferior border at inframammary fold with 0 V-lock suture. Laterally the mastectomy flap over posterior axillary line was advanced anteriorly and the subcutaneous tissue and superficial fascia was secured to pectoralis and serratus muscles with 0-vicryl. Skin closure completed with 3-0 vicryl in fascial layer and 4-0 vicryl in dermis. Skin closure completed with 4-0 monocryl subcuticular and tissue adhesive. Patient brought to upright sitting position. The mastectomy flap was redraped so that NAC was symmetric from sternal notch and chest midline with opposite breast. Patient returned to supine position. Tissue adhesive applied over all incisions. Tegaderm dressings applied followed by dry dressing, breast binder.  The patient was allowed to wake from anesthesia, extubated and taken to the recovery room in satisfactory condition.   SPECIMENS: none  DRAINS: 15 and 19 Fr JP in left breast reconstruction.  Irene Limbo, MD [facility] Plastic & Reconstructive Surgery  Office/ physician access line after hours [phone]

## 2022-01-31 NOTE — Transfer of Care (Signed)
Immediate Anesthesia Transfer of Care Note  Patient: Autumn Wyatt  Procedure(s) Performed: LEFT NIPPLE SPARING MASTECTOMY AND SENTINEL LYMPH NODE BIOPSY (Left: Breast) BREAST RECONSTRUCTION WITH PLACEMENT OF TISSUE EXPANDER AND ALLODERM (Left: Breast)  Patient Location: PACU  Anesthesia Type:GA combined with regional for post-op pain  Level of Consciousness: awake, alert  and oriented  Airway & Oxygen Therapy: Patient Spontanous Breathing and Patient connected to face mask oxygen  Post-op Assessment: Report given to RN and Post -op Vital signs reviewed and stable  Post vital signs: Reviewed and stable  Last Vitals:  Vitals Value Taken Time  BP    Temp    Pulse 89 01/31/22 1435  Resp    SpO2 99 % 01/31/22 1435  Vitals shown include unvalidated device data.  Last Pain:  Vitals:   01/31/22 1050  TempSrc: Oral  PainSc: 0-No pain         Complications: No notable events documented.

## 2022-01-31 NOTE — Anesthesia Preprocedure Evaluation (Addendum)
Anesthesia Evaluation  Patient identified by MRN, date of birth, ID band Patient awake    Reviewed: Allergy & Precautions, NPO status , Patient's Chart, lab work & pertinent test results  Airway Mallampati: I  TM Distance: >3 FB Neck ROM: Full    Dental no notable dental hx.    Pulmonary neg pulmonary ROS,    Pulmonary exam normal        Cardiovascular hypertension, Normal cardiovascular exam+ dysrhythmias      Neuro/Psych negative neurological ROS  negative psych ROS   GI/Hepatic negative GI ROS, (+)     substance abuse  ,   Endo/Other  negative endocrine ROS  Renal/GU negative Renal ROS     Musculoskeletal negative musculoskeletal ROS (+)   Abdominal   Peds  Hematology negative hematology ROS (+)   Anesthesia Other Findings LEFT BREAST CANCER  Reproductive/Obstetrics hcg negative                            Anesthesia Physical Anesthesia Plan  ASA: 2  Anesthesia Plan: General and Regional   Post-op Pain Management: Regional block*   Induction: Intravenous  PONV Risk Score and Plan: 3 and Ondansetron, Dexamethasone and Treatment may vary due to age or medical condition  Airway Management Planned: LMA  Additional Equipment:   Intra-op Plan:   Post-operative Plan: Extubation in OR  Informed Consent: I have reviewed the patients History and Physical, chart, labs and discussed the procedure including the risks, benefits and alternatives for the proposed anesthesia with the patient or authorized representative who has indicated his/her understanding and acceptance.     Dental advisory given  Plan Discussed with: CRNA  Anesthesia Plan Comments:        Anesthesia Quick Evaluation

## 2022-01-31 NOTE — Interval H&P Note (Signed)
History and Physical Interval Note:no change in H and P  01/31/2022 11:54 AM  Autumn Wyatt  has presented today for surgery, with the diagnosis of LEFT BREAST CANCER.  The various methods of treatment have been discussed with the patient and family. After consideration of risks, benefits and other options for treatment, the patient has consented to  Procedure(s): LEFT NIPPLE SPARING MASTECTOMY AND SENTINEL LYMPH NODE BIOPSY (Left) BREAST RECONSTRUCTION WITH PLACEMENT OF TISSUE EXPANDER AND ALLODERM (Left) as a surgical intervention.  The patient's history has been reviewed, patient examined, no change in status, stable for surgery.  I have reviewed the patient's chart and labs.  Questions were answered to the patient's satisfaction.     Coralie Keens

## 2022-01-31 NOTE — Interval H&P Note (Signed)
History and Physical Interval Note:  01/31/2022 10:54 AM  Autumn Wyatt  has presented today for surgery, with the diagnosis of LEFT BREAST CANCER.  The various methods of treatment have been discussed with the patient and family. After consideration of risks, benefits and other options for treatment, the patient has consented to  Procedure(s): LEFT NIPPLE SPARING MASTECTOMY AND SENTINEL LYMPH NODE BIOPSY (Left) BREAST RECONSTRUCTION WITH PLACEMENT OF TISSUE EXPANDER AND ALLODERM (Left) as a surgical intervention.  The patient's history has been reviewed, patient examined, no change in status, stable for surgery.  I have reviewed the patient's chart and labs.  Questions were answered to the patient's satisfaction.     Arnoldo Hooker Jaquail Mclees

## 2022-01-31 NOTE — Anesthesia Procedure Notes (Signed)
Anesthesia Regional Block: Pectoralis block   Pre-Anesthetic Checklist: , timeout performed,  Correct Patient, Correct Site, Correct Laterality,  Correct Procedure, Correct Position, site marked,  Risks and benefits discussed,  Surgical consent,  Pre-op evaluation,  At surgeon's request and post-op pain management  Laterality: Left  Prep: chloraprep       Needles:  Injection technique: Single-shot  Needle Type: Echogenic Stimulator Needle     Needle Length: 9cm  Needle Gauge: 21     Additional Needles:   Procedures:,,,, ultrasound used (permanent image in chart),,    Narrative:  Start time: 01/31/2022 11:40 AM End time: 01/31/2022 11:50 AM Injection made incrementally with aspirations every 5 mL.  Performed by: Personally  Anesthesiologist: Murvin Natal, MD  Additional Notes: Functioning IV was confirmed and monitors were applied.  A timeout was performed. Sterile prep, hand hygiene and sterile gloves were used. A 55m 21ga Arrow echogenic stimulator needle was used. Negative aspiration and negative test dose prior to incremental administration of local anesthetic. The patient tolerated the procedure well.  Ultrasound guidance: relevent anatomy identified, needle position confirmed, local anesthetic spread visualized around nerve(s), vascular puncture avoided.  Image printed for medical record.

## 2022-01-31 NOTE — Op Note (Signed)
   Autumn Wyatt 01/31/2022   Pre-op Diagnosis: LEFT BREAST CANCER     Post-op Diagnosis: same  Procedure(s): LEFT NIPPLE SPARING MASTECTOMY AND DEEP AXILLARY SENTINEL LYMPH NODE BIOPSY INJECTION OF [place] NODE MAPPING   Surgeon: Coralie Keens, MD                 Celedonio Miyamoto, MD  Anesthesia: General  Staff:  Circulator: McDonough-Hughes, Delene Ruffini, RN; Merwyn Katos, RN Scrub Person: Patric Dykes, RN  Estimated Blood Loss: Minimal               Specimens: SENT TO PATH  Indications: This is a 56 year old female was found to have abnormalities on her left breast with premammography.  She underwent a biopsy showing invasive lobular carcinoma.  After extensive discussion with her and her husband as well as plastic surgeons decision has been made to proceed with a nipple sparing left breast mastectomy and sentinel lymph node biopsy  Procedure: The patient was brought to operating identifies correct patient.  She was placed upon on the operating table general anesthesia was induced.  I next injected mag trace under nipple areolar complex and massaged the breast gently.  Her breast and chest were then prepped and draped in usual sterile fashion.  I made a inframammary incision along the inframammary fold transversely with a scalpel.  I then dissected down into the subcutaneous tissue toward the muscle with electrocautery.  Identified the pectoralis muscle and slowly started dissecting underneath the breast tissue taking off the pectoralis muscle.  I worked medially toward the sternum and then superiorly toward the level of the clavicle and then laterally toward the axilla and latissimus.  The breast was easily freed up off of the chest wall.  I next started dissecting anteriorly removing the breast tissue from the overlying skin and dermis.  I started creating a skin flap moving medial to lateral and then going superiorly toward the nipple areolar complex.  Then using  scissors I dissected the breast tissue staying just underneath the nipple areolar complex.  Next using scalpel I excised tissue directly underneath the nipple which was sent to pathology for frozen section.  The frozen section on the nipple biopsy showed no evidence of cancer.  I continued the dissection moving superiorly past the nipple areolar complex and over the level of the clavicle.  I then dissected down to the chest wall.  I then slowly started removing the breast medial to lateral going toward the axilla with the cautery.  We then completed the mastectomy removing the breast tissue.  I marked the area of the areola with a suture.  The skin flap lateral to the axilla felt thick and with breast tissue so this was removed separately piecemeal removing the breast tissue and sending to pathology for evaluation.  I next identified lymph nodes in the axilla with the aid of the mag trace probe and removed what appeared to be at least 2 sentinel lymph nodes which were sent to pathology for evaluation.  I could palpate no other lymph nodes in the axilla and could not definitely definitively define any further lymph nodes with the mag trace.  At this point, the case was turned over to Dr. Iran Planas to continue her part of the immediate reconstruction          Coralie Keens   Date: 01/31/2022  Time: 1:22 PM

## 2022-01-31 NOTE — H&P (Signed)
Autumn Wyatt is an 56 y.o. female.   Chief Complaint: Left breast cancer HPI: This is a 56 year old female who initially presented with an invasive lobular carcinoma of the left breast.  MRI showed the cancer in the upper outer quadrant of the left breast with non-mass-like enhancement extending for a total of 6.8 cm going up to the base of the nipple.  Further biopsy showed more invasive lobular carcinoma.  After discussion with medical and radiation oncology as well as plastic surgery, we have elected to proceed with a left nipple sparing mastectomy and sentinel lymph node biopsy.  She is otherwise healthy without complaints.  She has no cardiopulmonary issues.  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   Qualifier: Diagnosis of  By: Burnett Kanaris      Past Surgical History:  Procedure Laterality Date   BREAST BIOPSY Left 12/14/2021   CARPAL TUNNEL WITH CUBITAL TUNNEL Bilateral    EYE SURGERY  2006   KNEE SURGERY     REFRACTIVE SURGERY     SVT ABLATION N/A 05/21/2020   Procedure: SVT ABLATION;  Surgeon: Vickie Epley, MD;  Location: [facility];  Service: Cardiovascular;  Laterality: N/A;   WISDOM TOOTH EXTRACTION      Family History  Problem Relation Age of Onset   Chronic granulomatous disease Mother    Diabetes Mother    Chronic granulomatous disease Father    Breast cancer Paternal Aunt 44       died at 47   Diabetes Paternal Grandmother    Heart disease Paternal Grandmother    Social History:  reports that she has never smoked. She has never used smokeless tobacco. She reports current alcohol use of  about 5.0 - 10.0 standard drinks of alcohol per week. She reports that she does not use drugs.  Allergies:  Allergies  Allergen Reactions   Codeine Nausea And Vomiting   Demerol [Meperidine] Nausea And Vomiting   Midazolam Nausea And Vomiting    ALSO RELATED TO  VERSED   Oxycodone Nausea And Vomiting    No medications prior to admission.    No results found for this or any previous visit (from the past 48 hour(s)). No results found.  Review of Systems  All other systems reviewed and are negative.   Height '4\' 10"'$  (1.473 m), weight 63.3 kg. Physical Exam Constitutional:      General: She is not in acute distress.    Appearance: Normal appearance.  HENT:     Head: Normocephalic and atraumatic.     Right Ear: External ear normal.     Left Ear: External ear normal.     Nose: Nose normal.  Eyes:     Pupils: Pupils are equal, round, and reactive to light.  Cardiovascular:     Rate and Rhythm: Normal rate and regular rhythm.  Pulmonary:     Effort: Pulmonary effort is normal.     Breath sounds: Normal breath sounds.  Abdominal:     General: Abdomen is flat.     Palpations: Abdomen is soft.  Tenderness: There is no abdominal tenderness.  Musculoskeletal:        General: Normal range of motion.  Skin:    General: Skin is warm and dry.  Neurological:     General: No focal deficit present.     Mental Status: She is alert.  Psychiatric:        Behavior: Behavior normal.        Thought Content: Thought content normal.   There are no palpable left breast masses on exam and no left axillary lymphadenopathy  Assessment/Plan Left breast invasive lobular carcinoma  After a long and thorough discussion with the patient and her husband as well as discussions with medical and radiation oncology as well as plastic surgery the decision has been made to proceed with a left breast nipple sparing mastectomy and sentinel lymph node biopsy.  We discussed the procedure in detail.  We  discussed the risks which includes but is not limited to bleeding, infection, injury to surrounding structures, the need to remove the nipple areolar complex if margins are positive, necrosis of the nipple areolar complex with need for further procedures, arm swelling with sentinel lymph node biopsy, cardiopulmonary issues, postoperative recovery, etc.  After a thorough discussion, surgery is scheduled  Coralie Keens, MD 01/31/2022, 8:20 AM

## 2022-01-31 NOTE — Progress Notes (Signed)
Assisted Dr. Ellender with left, pectoralis, ultrasound guided block. Side rails up, monitors on throughout procedure. See vital signs in flow sheet. Tolerated Procedure well. 

## 2022-02-01 ENCOUNTER — Encounter (HOSPITAL_BASED_OUTPATIENT_CLINIC_OR_DEPARTMENT_OTHER): Payer: Self-pay | Admitting: Surgery

## 2022-02-01 DIAGNOSIS — C50412 Malignant neoplasm of upper-outer quadrant of left female breast: Secondary | ICD-10-CM | POA: Diagnosis not present

## 2022-02-01 MED ORDER — ENOXAPARIN SODIUM 40 MG/0.4ML IJ SOSY
40.0000 mg | PREFILLED_SYRINGE | Freq: Once | INTRAMUSCULAR | Status: AC
Start: 2022-02-01 — End: 2022-02-01
  Administered 2022-02-01: 40 mg via SUBCUTANEOUS

## 2022-02-01 MED ORDER — DEXMEDETOMIDINE (PRECEDEX) IN NS 20 MCG/5ML (4 MCG/ML) IV SYRINGE
PREFILLED_SYRINGE | INTRAVENOUS | Status: DC | PRN
  Administered 2022-01-31: 20 ug via INTRAVENOUS

## 2022-02-01 NOTE — Addendum Note (Signed)
Addendum  created 02/01/22 1313 by Murvin Natal, MD   Intraprocedure Meds edited

## 2022-02-01 NOTE — Discharge Instructions (Signed)

## 2022-02-01 NOTE — Discharge Summary (Addendum)
Physician Discharge Summary  Patient ID: Autumn Wyatt MRN: [id number] DOB/AGE: 57-Sep-1967 57 y.o.  Admit date: 01/31/2022 Discharge date: 02/01/2022  Admission Diagnoses: left breast cancer  Discharge Diagnoses:  same  Discharged Condition: stable  Hospital Course: Postoperatively patient did well tolerating diet pain controlled and ambulatory without assist. Instructed on bathing compression drain care.  Treatments: surgery: left nipple sparing mastectomies left breast reconstruction with tissue expander acellular dermis 6.20.2023  Discharge Exam: Blood pressure 133/84, pulse 67, temperature 98.9 F (37.2 C), resp. rate 16, height '4\' 10"'$  (1.473 m), weight 63.4 kg, SpO2 98 %. Incision/Wound: chest soft incision dry intact drains serosanguinous unchanged ecchymoses (pre morbid post biopsy) left chest no hematoma  Disposition: Discharge disposition: 01-Home or Self Care       Discharge Instructions     Call MD for:  redness, tenderness, or signs of infection (pain, swelling, bleeding, redness, odor or green/yellow discharge around incision site)   Complete by: As directed    Call MD for:  temperature >100.5   Complete by: As directed    Discharge instructions   Complete by: As directed    Ok to remove dressings and shower am 6.22.23. Soap and water ok, pat Tegaderms dry. Do not remove Tegaderms. No creams or ointments over incisions. Do not let drains dangle in shower, attach to lanyard or similar.Strip and record drains twice daily and bring log to clinic visit.  Breast binder or soft compression bra all other times.  Ok to raise arms above shoulders for bathing and dressing.  No house yard work or exercise until cleared by MD.   Recommend ibuprofen with meals to aid with pain control. Also ok to use tylenol. Recommend Miralax or Dulcolax as needed for constipation. Patient has pain medication at home.   Driving Restrictions   Complete by: As directed    No driving  for 2 weeks then no driving if taking prescription pain medication   Lifting restrictions   Complete by: As directed    No lifting > 5-10 lb until cleared by MD   Nursing communication   Complete by: As directed    Please give lovenox dose prior to d/c   Resume previous diet   Complete by: As directed       Allergies as of 02/01/2022       Reactions   Codeine Nausea And Vomiting   Demerol [meperidine] Nausea And Vomiting   Midazolam Nausea And Vomiting   ALSO RELATED TO  VERSED   Oxycodone Nausea And Vomiting        Medication List     TAKE these medications    acetaminophen 500 MG tablet Commonly known as: TYLENOL Take 500 mg by mouth every 6 (six) hours as needed for mild pain or moderate pain.   chlorthalidone 25 MG tablet Commonly known as: HYGROTON Take 25 mg by mouth daily.   EMERGEN-C IMMUNE PLUS PO Take 1 tablet by mouth daily as needed. Vitamin D3   ibuprofen 600 MG tablet Commonly known as: ADVIL Take 400-600 mg by mouth every 6 (six) hours as needed for headache.        Follow-up Information     Irene Limbo, MD Follow up in 1 week(s).   Specialty: Plastic Surgery Why: as scheduled Contact information: [address] [place]  [address] [phone]         Coralie Keens, MD. Schedule an appointment as soon as possible for a visit in 2 week(s).  Specialty: General Surgery Contact information: [address] [place] [address] [phone]                 Signed: Irene Limbo 02/01/2022, 7:41 AM

## 2022-02-03 LAB — SURGICAL PATHOLOGY

## 2022-02-09 ENCOUNTER — Telehealth: Payer: Self-pay | Admitting: *Deleted

## 2022-02-09 ENCOUNTER — Encounter: Payer: Self-pay | Admitting: *Deleted

## 2022-02-09 NOTE — Telephone Encounter (Signed)
Ordered oncotype per Dr. Feng. Requisition sent to pathology and exact sciences. 

## 2022-02-10 ENCOUNTER — Ambulatory Visit (HOSPITAL_BASED_OUTPATIENT_CLINIC_OR_DEPARTMENT_OTHER): Admission: RE | Admit: 2022-02-10 | Payer: 59 | Source: Home / Self Care | Admitting: Surgery

## 2022-02-10 ENCOUNTER — Encounter (HOSPITAL_BASED_OUTPATIENT_CLINIC_OR_DEPARTMENT_OTHER): Admission: RE | Payer: Self-pay | Source: Home / Self Care

## 2022-02-10 SURGERY — BREAST LUMPECTOMY WITH RADIOACTIVE SEED AND SENTINEL LYMPH NODE BIOPSY
Anesthesia: General | Site: Breast | Laterality: Left

## 2022-02-15 NOTE — H&P (Signed)
Subjective:     Patient ID: Autumn Wyatt is a 56 y.o. female.   HPI   2 weeks post op left nipple sparing mastectomy with immediate tissue expander based reconstruction. Drain < 30 ml per day.   Presented following screening MMG with left breast distortion. Diagnostic MMG/US showed a 1.1 x 1 x 0.8 cm mass at 2 o'clock LEFT breast 5 cmfn. Axilla normal. 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    MRI demonstrated known malignancy left UOQ measuring 1.3 cm.  Additional NME extends from the anterior aspect of the known malignancy to the base of the nipple, measuring a total of 6.8 cm. MR guided biopsy labeled left breast outer third NME showed ILC ER/PR+ Her2-. Given span of disease, mastectomy recommended.    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   Plan Oncotype on surgical sample. Declined genetics.   Prior 32 B. Desires larger. Left mastectomy- no weight specimen recorded at pathology   Lives with spouse. Two adult children. Daughter is ICU RN at [employer].   Review of Systems      Objective:   Physical Exam Cardiovascular:     Rate and Rhythm: Normal rate and regular rhythm.     Heart sounds: Normal heart sounds.  Pulmonary:     Effort: Pulmonary effort is normal.     Breath sounds: Normal breath sounds.     Chest: incision intact nipple itself viable, areola and skin inferior to nipple with eschar including base of nipple Drain serous    Assessment:     Left breast ca UOQ ER+ S/p left NSM SLN prepectoral TE/ADM (Alloderm) reconstruction.    Plan:     Oncotype pending. Reviewed expected timing of this. Discussed this will affect decision making on chemotherapy not radiation. Has Dr. Ninfa Linden and Rad Onc visits scheduled for next week.    19 Fr JP removed. Continue compression light activities hold on PT exercises until healed from below.   I am concerned for full thickness skin necrosis and  recommend operative debridement and closure. Reviewed in mirror my concern that while nipple tip is viable the base of nipple does have some changes. I am concerned if I try to close to this skin will not heal well. Removal of nipple with area of necrosis would allow for closure to healthy appearing skin and hopefully have less concern with healing. This is disappointing to her but she and husband understand. Will complete saline exchange while in OR, reviewed may need additional fills in office. Plan OR this week. Reviewed OP surgery no drains anticipated.   Natrelle 133S FV-12-T 400 ml tissue expander placed initial fill volume 180 ml air

## 2022-02-16 ENCOUNTER — Encounter (HOSPITAL_COMMUNITY): Payer: Self-pay | Admitting: Plastic Surgery

## 2022-02-16 ENCOUNTER — Other Ambulatory Visit: Payer: Self-pay

## 2022-02-16 NOTE — Progress Notes (Signed)
PCP - Dr Katharine Look Rivard Cardiologist - Dr Lars Mage (last OV 06/23/20) Oncology - Dr Truitt Merle  Chest x-ray - n/a EKG - 01/24/22 Stress Test - 02/24/19 CE ECHO - 02/24/19 CE Cardiac Cath - n/a  ICD Pacemaker/Loop - n/a  Sleep Study -  n/a CPAP - none  STOP now taking any Aspirin (unless otherwise instructed by your surgeon), Aleve, Naproxen, Ibuprofen, Motrin, Advil, Goody's, BC's, all herbal medications, fish oil, and all vitamins.   Coronavirus Screening Do you have any of the following symptoms:  Cough yes/no: No Fever (>100.9F)  yes/no: No Runny nose yes/no: No Sore throat yes/no: No Difficulty breathing/shortness of breath  yes/no: No  Have you traveled in the last 14 days and where? yes/no: No  Patient verbalized understanding of instructions that were given via phone.

## 2022-02-17 ENCOUNTER — Telehealth: Payer: Self-pay | Admitting: *Deleted

## 2022-02-17 NOTE — Telephone Encounter (Signed)
Received oncotype score of 10. Physician team notified. Called pt with results, discussed chemo not recommended. Pt had questions regarding post mastectomy xrt Confirmed appt with Dr. Lisbeth Renshaw on 712 to further discuss Scheduled and confirmed appt with Dr. Burr Medico on 7/14 to further discuss oncotype score and further treatment.

## 2022-02-17 NOTE — Anesthesia Preprocedure Evaluation (Signed)
Anesthesia Evaluation  Patient identified by MRN, date of birth, ID band Patient awake    Reviewed: Allergy & Precautions, NPO status , Patient's Chart, lab work & pertinent test results  History of Anesthesia Complications Negative for: history of anesthetic complications  Airway Mallampati: II  TM Distance: >3 FB Neck ROM: Full    Dental  (+) Dental Advisory Given, Teeth Intact   Pulmonary neg pulmonary ROS,    Pulmonary exam normal        Cardiovascular hypertension, Pt. on medications Normal cardiovascular exam+ dysrhythmias Supra Ventricular Tachycardia      Neuro/Psych negative neurological ROS  negative psych ROS   GI/Hepatic negative GI ROS, Neg liver ROS,   Endo/Other  negative endocrine ROS  Renal/GU negative Renal ROS     Musculoskeletal negative musculoskeletal ROS (+)   Abdominal   Peds  Hematology negative hematology ROS (+)   Anesthesia Other Findings   Reproductive/Obstetrics  Breast cancer                             Anesthesia Physical Anesthesia Plan  ASA: 2  Anesthesia Plan: General   Post-op Pain Management: Tylenol PO (pre-op)* and Celebrex PO (pre-op)*   Induction: Intravenous  PONV Risk Score and Plan: 3 and Treatment may vary due to age or medical condition, Ondansetron, Scopolamine patch - Pre-op and Dexamethasone  Airway Management Planned: LMA  Additional Equipment: None  Intra-op Plan:   Post-operative Plan: Extubation in OR  Informed Consent: I have reviewed the patients History and Physical, chart, labs and discussed the procedure including the risks, benefits and alternatives for the proposed anesthesia with the patient or authorized representative who has indicated his/her understanding and acceptance.     Dental advisory given  Plan Discussed with: CRNA and Anesthesiologist  Anesthesia Plan Comments:        Anesthesia Quick  Evaluation

## 2022-02-18 ENCOUNTER — Ambulatory Visit (HOSPITAL_COMMUNITY): Payer: 59 | Admitting: Anesthesiology

## 2022-02-18 ENCOUNTER — Encounter (HOSPITAL_COMMUNITY): Payer: Self-pay | Admitting: Plastic Surgery

## 2022-02-18 ENCOUNTER — Ambulatory Visit (HOSPITAL_BASED_OUTPATIENT_CLINIC_OR_DEPARTMENT_OTHER): Payer: 59 | Admitting: Anesthesiology

## 2022-02-18 ENCOUNTER — Other Ambulatory Visit: Payer: Self-pay

## 2022-02-18 ENCOUNTER — Ambulatory Visit (HOSPITAL_COMMUNITY)
Admission: RE | Admit: 2022-02-18 | Discharge: 2022-02-18 | Disposition: A | Discharge status: Home / Self Care | Payer: 59 | Attending: Plastic Surgery | Admitting: Plastic Surgery

## 2022-02-18 ENCOUNTER — Encounter (HOSPITAL_COMMUNITY)
Admission: RE | Disposition: A | Discharge status: Home / Self Care | Payer: Self-pay | Source: Home / Self Care | Attending: Plastic Surgery

## 2022-02-18 DIAGNOSIS — C50412 Malignant neoplasm of upper-outer quadrant of left female breast: Secondary | ICD-10-CM | POA: Diagnosis not present

## 2022-02-18 DIAGNOSIS — Z9012 Acquired absence of left breast and nipple: Secondary | ICD-10-CM | POA: Diagnosis not present

## 2022-02-18 DIAGNOSIS — C50812 Malignant neoplasm of overlapping sites of left female breast: Secondary | ICD-10-CM | POA: Diagnosis present

## 2022-02-18 DIAGNOSIS — Z170 Estrogen receptor positive status [ER+]: Secondary | ICD-10-CM | POA: Insufficient documentation

## 2022-02-18 DIAGNOSIS — L7682 Other postprocedural complications of skin and subcutaneous tissue: Secondary | ICD-10-CM | POA: Diagnosis not present

## 2022-02-18 DIAGNOSIS — I10 Essential (primary) hypertension: Secondary | ICD-10-CM | POA: Diagnosis not present

## 2022-02-18 HISTORY — PX: INCISION AND DRAINAGE OF WOUND: SHX1803

## 2022-02-18 LAB — BASIC METABOLIC PANEL
Anion gap: 16 — ABNORMAL HIGH (ref 5–15)
BUN: 15 mg/dL (ref 6–20)
CO2: 21 mmol/L — ABNORMAL LOW (ref 22–32)
Calcium: 10 mg/dL (ref 8.9–10.3)
Chloride: 103 mmol/L (ref 98–111)
Creatinine, Ser: 0.51 mg/dL (ref 0.44–1.00)
GFR, Estimated: >60 mL/min (ref >60)
Glucose, Bld: 107 mg/dL — ABNORMAL HIGH (ref 70–99)
Potassium: 4 mmol/L (ref 3.5–5.1)
Sodium: 140 mmol/L (ref 135–145)

## 2022-02-18 LAB — CBC
HCT: 44.3 % (ref 36.0–46.0)
Hemoglobin: 15.2 g/dL — ABNORMAL HIGH (ref 12.0–15.0)
MCH: 33.7 pg (ref 26.0–34.0)
MCHC: 34.3 g/dL (ref 30.0–36.0)
MCV: 98.2 fL (ref 80.0–100.0)
Platelets: 256 10*3/uL (ref 150–400)
RBC: 4.51 MIL/uL (ref 3.87–5.11)
RDW: 12.5 % (ref 11.5–15.5)
WBC: 6.4 10*3/uL (ref 4.0–10.5)
nRBC: 0 % (ref 0.0–0.2)

## 2022-02-18 LAB — POCT PREGNANCY, URINE: Preg Test, Ur: NEGATIVE

## 2022-02-18 SURGERY — IRRIGATION AND DEBRIDEMENT WOUND
Anesthesia: General | Laterality: Left

## 2022-02-18 MED ORDER — FENTANYL CITRATE (PF) 100 MCG/2ML IJ SOLN: 25.0000 ug | INTRAMUSCULAR | Status: DC | PRN

## 2022-02-18 MED ORDER — ORAL CARE MOUTH RINSE: 15.0000 mL | Freq: Once | OROMUCOSAL | Status: AC

## 2022-02-18 MED ORDER — CEFAZOLIN SODIUM-DEXTROSE 2-4 GM/100ML-% IV SOLN
2.0000 g | INTRAVENOUS | Status: AC
  Administered 2022-02-18: 2 g via INTRAVENOUS
  Filled 2022-02-18: qty 100

## 2022-02-18 MED ORDER — CHLORHEXIDINE GLUCONATE 0.12 % MT SOLN
OROMUCOSAL | Status: AC
  Administered 2022-02-18: 15 mL via OROMUCOSAL
  Filled 2022-02-18: qty 15

## 2022-02-18 MED ORDER — SULFAMETHOXAZOLE-TRIMETHOPRIM 800-160 MG PO TABS
1.0000 | ORAL_TABLET | Freq: Two times a day (BID) | ORAL | 0 refills | Status: DC
Start: 2022-02-18 — End: 2022-03-22

## 2022-02-18 MED ORDER — SODIUM CHLORIDE 0.9 % IV SOLN
INTRAVENOUS | Status: DC | PRN
  Administered 2022-02-18: 500 mL

## 2022-02-18 MED ORDER — DEXAMETHASONE SODIUM PHOSPHATE 10 MG/ML IJ SOLN
INTRAMUSCULAR | Status: AC
  Filled 2022-02-18: qty 1

## 2022-02-18 MED ORDER — ONDANSETRON HCL 4 MG/2ML IJ SOLN
INTRAMUSCULAR | Status: AC
Start: 2022-02-18 — End: ?
  Filled 2022-02-18: qty 2

## 2022-02-18 MED ORDER — GLYCOPYRROLATE PF 0.2 MG/ML IJ SOSY
PREFILLED_SYRINGE | INTRAMUSCULAR | Status: DC | PRN
  Administered 2022-02-18: .1 mg via INTRAVENOUS

## 2022-02-18 MED ORDER — LACTATED RINGERS IV SOLN: INTRAVENOUS | Status: DC

## 2022-02-18 MED ORDER — ACETAMINOPHEN 500 MG PO TABS
1000.0000 mg | ORAL_TABLET | Freq: Once | ORAL | Status: AC
  Administered 2022-02-18: 1000 mg via ORAL
  Filled 2022-02-18: qty 2

## 2022-02-18 MED ORDER — CELECOXIB 200 MG PO CAPS
200.0000 mg | ORAL_CAPSULE | Freq: Once | ORAL | Status: AC
Start: 2022-02-18 — End: 2022-02-18
  Administered 2022-02-18: 200 mg via ORAL
  Filled 2022-02-18: qty 1

## 2022-02-18 MED ORDER — FENTANYL CITRATE (PF) 250 MCG/5ML IJ SOLN
INTRAMUSCULAR | Status: DC | PRN
Start: 2022-02-18 — End: 2022-02-18
  Administered 2022-02-18 (×3): 25 ug via INTRAVENOUS

## 2022-02-18 MED ORDER — SCOPOLAMINE 1 MG/3DAYS TD PT72
1.0000 | MEDICATED_PATCH | TRANSDERMAL | Status: DC
  Administered 2022-02-18: 1.5 mg via TRANSDERMAL
  Filled 2022-02-18: qty 1

## 2022-02-18 MED ORDER — PHENYLEPHRINE 80 MCG/ML (10ML) SYRINGE FOR IV PUSH (FOR BLOOD PRESSURE SUPPORT)
PREFILLED_SYRINGE | INTRAVENOUS | Status: DC | PRN
  Administered 2022-02-18 (×5): 80 ug via INTRAVENOUS

## 2022-02-18 MED ORDER — ONDANSETRON HCL 4 MG/2ML IJ SOLN
INTRAMUSCULAR | Status: DC | PRN
  Administered 2022-02-18: 4 mg via INTRAVENOUS

## 2022-02-18 MED ORDER — ONDANSETRON HCL 4 MG/2ML IJ SOLN: 4.0000 mg | Freq: Once | INTRAMUSCULAR | Status: DC | PRN

## 2022-02-18 MED ORDER — 0.9 % SODIUM CHLORIDE (POUR BTL) OPTIME
TOPICAL | Status: DC | PRN
  Administered 2022-02-18: 1000 mL

## 2022-02-18 MED ORDER — FENTANYL CITRATE (PF) 250 MCG/5ML IJ SOLN
INTRAMUSCULAR | Status: AC
  Filled 2022-02-18: qty 5

## 2022-02-18 MED ORDER — PROPOFOL 10 MG/ML IV BOLUS
INTRAVENOUS | Status: AC
  Filled 2022-02-18: qty 20

## 2022-02-18 MED ORDER — DEXAMETHASONE SODIUM PHOSPHATE 10 MG/ML IJ SOLN
INTRAMUSCULAR | Status: DC | PRN
  Administered 2022-02-18: 10 mg via INTRAVENOUS

## 2022-02-18 MED ORDER — PROPOFOL 10 MG/ML IV BOLUS
INTRAVENOUS | Status: DC | PRN
  Administered 2022-02-18: 200 mg via INTRAVENOUS

## 2022-02-18 MED ORDER — MIDAZOLAM HCL 2 MG/2ML IJ SOLN
INTRAMUSCULAR | Status: AC
  Filled 2022-02-18: qty 2

## 2022-02-18 MED ORDER — LIDOCAINE 2% (20 MG/ML) 5 ML SYRINGE
INTRAMUSCULAR | Status: DC | PRN
  Administered 2022-02-18: 60 mg via INTRAVENOUS

## 2022-02-18 MED ORDER — CHLORHEXIDINE GLUCONATE 0.12 % MT SOLN: 15.0000 mL | Freq: Once | OROMUCOSAL | Status: AC

## 2022-02-18 MED ORDER — SODIUM CHLORIDE 0.9 % IV SOLN
Freq: Once | INTRAVENOUS | Status: DC
  Filled 2022-02-18 (×2): qty 10

## 2022-02-18 SURGICAL SUPPLY — 74 items
BAG COUNTER SPONGE SURGICOUNT (BAG) ×1 IMPLANT
BAG DECANTER FOR FLEXI CONT (MISCELLANEOUS) ×2 IMPLANT
BINDER BREAST LRG (GAUZE/BANDAGES/DRESSINGS) IMPLANT
BINDER BREAST XLRG (GAUZE/BANDAGES/DRESSINGS) ×2 IMPLANT
BLADE CLIPPER SURG (BLADE) IMPLANT
BNDG GAUZE ELAST 4 BULKY (GAUZE/BANDAGES/DRESSINGS) IMPLANT
CANISTER SUCT 3000ML PPV (MISCELLANEOUS) ×2 IMPLANT
CHLORAPREP W/TINT 26 (MISCELLANEOUS) ×2 IMPLANT
CNTNR URN SCR LID CUP LEK RST (MISCELLANEOUS) IMPLANT
CONT SPEC 4OZ STRL OR WHT (MISCELLANEOUS) ×2
COVER SURGICAL LIGHT HANDLE (MISCELLANEOUS) ×2 IMPLANT
DERMABOND ADVANCED (GAUZE/BANDAGES/DRESSINGS) ×1
DERMABOND ADVANCED .7 DNX12 (GAUZE/BANDAGES/DRESSINGS) ×2 IMPLANT
DRAIN CHANNEL 15F RND FF W/TCR (WOUND CARE) IMPLANT
DRAIN CHANNEL 19F RND (DRAIN) ×2 IMPLANT
DRAPE HALF SHEET 40X57 (DRAPES) ×2 IMPLANT
DRAPE IMP U-DRAPE 54X76 (DRAPES) ×1 IMPLANT
DRAPE INCISE IOBAN 66X45 STRL (DRAPES) IMPLANT
DRAPE LAPAROTOMY 100X72 PEDS (DRAPES) ×1 IMPLANT
DRAPE TOP ARMCOVERS (MISCELLANEOUS) ×1 IMPLANT
DRAPE U-SHAPE 76X120 STRL (DRAPES) ×1 IMPLANT
DRAPE WARM FLUID 44X44 (DRAPES) ×1 IMPLANT
DRSG ADAPTIC 3X8 NADH LF (GAUZE/BANDAGES/DRESSINGS) IMPLANT
DRSG PAD ABDOMINAL 8X10 ST (GAUZE/BANDAGES/DRESSINGS) ×3 IMPLANT
DRSG TEGADERM 4X4.75 (GAUZE/BANDAGES/DRESSINGS) ×4 IMPLANT
DRSG VAC ATS LRG SENSATRAC (GAUZE/BANDAGES/DRESSINGS) IMPLANT
DRSG VAC ATS MED SENSATRAC (GAUZE/BANDAGES/DRESSINGS) IMPLANT
DRSG VAC ATS SM SENSATRAC (GAUZE/BANDAGES/DRESSINGS) IMPLANT
ELECT BLADE 4.0 EZ CLEAN MEGAD (MISCELLANEOUS) ×2
ELECT CAUTERY BLADE 6.4 (BLADE) ×1 IMPLANT
ELECT COATED BLADE 2.86 ST (ELECTRODE) ×2 IMPLANT
ELECT REM PT RETURN 9FT ADLT (ELECTROSURGICAL) ×2
ELECTRODE BLDE 4.0 EZ CLN MEGD (MISCELLANEOUS) ×1 IMPLANT
ELECTRODE REM PT RTRN 9FT ADLT (ELECTROSURGICAL) ×1 IMPLANT
EVACUATOR SILICONE 100CC (DRAIN) ×2 IMPLANT
GAUZE PAD ABD 8X10 STRL (GAUZE/BANDAGES/DRESSINGS) ×1 IMPLANT
GAUZE SPONGE 4X4 12PLY STRL (GAUZE/BANDAGES/DRESSINGS) ×2 IMPLANT
GLOVE BIO SURGEON STRL SZ 6 (GLOVE) ×6 IMPLANT
GLOVE SURG SS PI 6.0 STRL IVOR (GLOVE) ×2 IMPLANT
GOWN STRL REUS W/ TWL LRG LVL3 (GOWN DISPOSABLE) ×2 IMPLANT
GOWN STRL REUS W/TWL LRG LVL3 (GOWN DISPOSABLE) ×4
HANDPIECE INTERPULSE COAX TIP (DISPOSABLE)
KIT BASIN OR (CUSTOM PROCEDURE TRAY) ×2 IMPLANT
KIT TURNOVER KIT B (KITS) ×2 IMPLANT
MARKER SKIN DUAL TIP RULER LAB (MISCELLANEOUS) ×1 IMPLANT
NS IRRIG 1000ML POUR BTL (IV SOLUTION) ×3 IMPLANT
PACK GENERAL/GYN (CUSTOM PROCEDURE TRAY) ×2 IMPLANT
PACK UNIVERSAL I (CUSTOM PROCEDURE TRAY) ×1 IMPLANT
PAD ARMBOARD 7.5X6 YLW CONV (MISCELLANEOUS) ×3 IMPLANT
PIN SAFETY STERILE (MISCELLANEOUS) ×1 IMPLANT
SET ASEPTIC TRANSFER (MISCELLANEOUS) ×2 IMPLANT
SET HNDPC FAN SPRY TIP SCT (DISPOSABLE) IMPLANT
SOL PREP POV-IOD 4OZ 10% (MISCELLANEOUS) ×2 IMPLANT
STAPLER VISISTAT 35W (STAPLE) ×2 IMPLANT
SURGILUBE 2OZ TUBE FLIPTOP (MISCELLANEOUS) IMPLANT
SUT CHROMIC 4 0 PS 2 18 (SUTURE) IMPLANT
SUT ETHILON 2 0 FS 18 (SUTURE) ×4 IMPLANT
SUT MNCRL AB 4-0 PS2 18 (SUTURE) ×1 IMPLANT
SUT MON AB 3-0 SH 27 (SUTURE) ×2
SUT MON AB 3-0 SH27 (SUTURE) IMPLANT
SUT PDS AB 2-0 CT2 27 (SUTURE) IMPLANT
SUT VIC AB 0 CT2 27 (SUTURE) IMPLANT
SUT VIC AB 3-0 SH 27 (SUTURE)
SUT VIC AB 3-0 SH 27X BRD (SUTURE) IMPLANT
SUT VICRYL 4-0 PS2 18IN ABS (SUTURE) IMPLANT
SUT VLOC 180 0 24IN GS25 (SUTURE) IMPLANT
SWAB COLLECTION DEVICE MRSA (MISCELLANEOUS) IMPLANT
SWAB CULTURE ESWAB REG 1ML (MISCELLANEOUS) IMPLANT
SYR BULB IRRIG 60ML STRL (SYRINGE) ×1 IMPLANT
TOWEL GREEN STERILE (TOWEL DISPOSABLE) ×2 IMPLANT
TOWEL GREEN STERILE FF (TOWEL DISPOSABLE) ×2 IMPLANT
TRAY FOLEY MTR SLVR 16FR STAT (SET/KITS/TRAYS/PACK) IMPLANT
TUBE CONNECTING 12X1/4 (SUCTIONS) ×2 IMPLANT
UNDERPAD 30X36 HEAVY ABSORB (UNDERPADS AND DIAPERS) ×2 IMPLANT

## 2022-02-18 NOTE — Anesthesia Procedure Notes (Signed)
Procedure Name: LMA Insertion Date/Time: 02/18/2022 7:34 AM  Performed by: Wilburn Cornelia, CRNAPre-anesthesia Checklist: Patient identified, Emergency Drugs available, Suction available, Patient being monitored and Timeout performed Patient Re-evaluated:Patient Re-evaluated prior to induction Oxygen Delivery Method: Circle system utilized Preoxygenation: Pre-oxygenation with 100% oxygen Induction Type: IV induction Ventilation: Mask ventilation without difficulty LMA: LMA inserted LMA Size: 4.0 Number of attempts: 1 Placement Confirmation: positive ETCO2 and breath sounds checked- equal and bilateral Tube secured with: Tape Dental Injury: Teeth and Oropharynx as per pre-operative assessment

## 2022-02-18 NOTE — Interval H&P Note (Signed)
History and Physical Interval Note:  02/18/2022 7:09 AM  Autumn Wyatt  has presented today for surgery, with the diagnosis of LEFT BREAST CANCER UPPER OUTER [name] ABSENT BREAST SKIN FLAP NECROSIS.  The various methods of treatment have been discussed with the patient and family. After consideration of risks, benefits and other options for treatment, the patient has consented to debridement left mastectomy flap tissue expansion left chest as a surgical intervention.  The patient's history has been reviewed, patient examined, no change in status, stable for surgery.  I have reviewed the patient's chart and labs.  Questions were answered to the patient's satisfaction.     Arnoldo Hooker Linsey Hirota

## 2022-02-18 NOTE — Anesthesia Postprocedure Evaluation (Signed)
Anesthesia Post Note  Patient: Autumn Wyatt  Procedure(s) Performed: IRRIGATION AND DEBRIDEMENT WOUND-LEFT BREAST (Left) REVISION OF BREAST RECONSTRUCTION- TISSUE EXPANSION LEFT BREAST (Left)     Patient location during evaluation: PACU Anesthesia Type: General Level of consciousness: awake and alert Pain management: pain level controlled Vital Signs Assessment: post-procedure vital signs reviewed and stable Respiratory status: spontaneous breathing, nonlabored ventilation and respiratory function stable Cardiovascular status: stable and blood pressure returned to baseline Anesthetic complications: no   No notable events documented.  Last Vitals:  Vitals:   02/18/22 0847 02/18/22 0902  BP: 113/76 115/75  Pulse: 65 (!) 57  Resp: (!) 21 13  Temp:  36.7 C  SpO2: 100%     Last Pain:  Vitals:   02/18/22 0902  TempSrc:   PainSc: 0-No pain                 Audry Pili

## 2022-02-18 NOTE — Op Note (Signed)
Operative Note   DATE OF OPERATION: 7.8.23  LOCATION: [facility]  SURGICAL DIVISION: Plastic Surgery  PREOPERATIVE DIAGNOSES:  1. Left breast cancer UOQ ER+ 2. Acquired absence breast 3. Skin flap necrosis  POSTOPERATIVE DIAGNOSES:  same  PROCEDURE:  Excision left mastectomy flap and closure (complex repair left chest 6 cm)  SURGEON: Irene Limbo MD MBA  ASSISTANT: none  ANESTHESIA:  General.   EBL: 10 ml  COMPLICATIONS: None immediate.   INDICATIONS FOR PROCEDURE:  The patient, Autumn Wyatt, is a 56 y.o. female born on 11-06-1965, is here for treatment mastectomy flap necrosis following nipple sparing mastectomy with immediate tissue expander reconstruction.   FINDINGS: Dry eschar left inferior areola. Excision remainder nipple areola completed. Saline exchange completed with fill volume 175 ml saline.   DESCRIPTION OF PROCEDURE:  The patient's operative site was marked with the patient in the preoperative area. The patient was taken to the operating room. SCDs were placed and IV antibiotics were given. The patient's operative site was prepped and draped in a sterile fashion. A time out was performed and all information was confirmed to be correct. Excision left nipple areola complex including eschar completed. Wound margins and base noted to have bleeding. Wound irrigated with saline solution containing Ancef gentamicin and bacitracin. Layered closure completed with 3-0 monocryl in dermis and simple running skin closure with 4-0 monocryl. Dermabond applied. Tissue expander port accessed and air removed from expander. Saline infiltrated to 175 ml fill volume. Dry dressing and breast binder applied.   The patient was allowed to wake from anesthesia, extubated and taken to the recovery room in satisfactory condition.   SPECIMENS: left mastectomy flap  DRAINS: none  Irene Limbo, MD [facility] Plastic [facility] Reconstructive Surgery  Office/ physician access line after  hours [phone]

## 2022-02-18 NOTE — Transfer of Care (Signed)
Immediate Anesthesia Transfer of Care Note  Patient: Autumn Wyatt  Procedure(s) Performed: IRRIGATION AND DEBRIDEMENT WOUND-LEFT BREAST (Left) REVISION OF BREAST RECONSTRUCTION- TISSUE EXPANSION LEFT BREAST (Left)  Patient Location: PACU  Anesthesia Type:General  Level of Consciousness: awake, alert  and oriented  Airway & Oxygen Therapy: Patient Spontanous Breathing and Patient connected to nasal cannula oxygen  Post-op Assessment: Report given to RN and Post -op Vital signs reviewed and stable  Post vital signs: Reviewed and stable  Last Vitals:  Vitals Value Taken Time  BP 119/79 02/18/22 0832  Temp    Pulse 77 02/18/22 0834  Resp 23 02/18/22 0834  SpO2 100 % 02/18/22 0834  Vitals shown include unvalidated device data.  Last Pain:  Vitals:   02/18/22 6195  TempSrc: Oral  PainSc: 0-No pain      Patients Stated Pain Goal: 3 (09/32/67 1245)  Complications: No notable events documented.

## 2022-02-19 ENCOUNTER — Encounter (HOSPITAL_COMMUNITY): Payer: Self-pay | Admitting: Plastic Surgery

## 2022-02-20 NOTE — Progress Notes (Addendum)
Radiation Oncology         [phone] ________________________________  Name: Autumn Wyatt        MRN: [id number]  Date of Service: 02/22/2022 DOB: 1965/10/17  EH:MCNOBS, Katharine Look, MD  Coralie Keens, MD     REFERRING PHYSICIAN: Coralie Keens, MD   DIAGNOSIS: The encounter diagnosis was Malignant neoplasm of upper-outer quadrant of left breast in female, estrogen receptor positive ([place]).   HISTORY OF PRESENT ILLNESS: Autumn Wyatt is a 56 y.o. female originally seen in the multidisciplinary breast clinic for a new diagnosis of left breast cancer. The patient was noted to have a screen detected mass in the left breast that was seen in the 2 o'clock position and by ultrasound this measured 1.1 cm.  Her axilla was negative for adenopathy.  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  Her tumor was ER/PR positive HER2 negative with a Ki-67 of 1%.    Since her last visit she underwent MRI of the breasts on 12/30/2021 which showed her known malignancy in the upper outer quadrant of the left breast.  Additional linear non-mass enhancement in the left breast was seen totaling up to 6.8 cm.  Her right breast was negative for abnormalities and no evidence of adenopathy was seen in either axilla.  Additional biopsy on 01/06/2022 of the non-mass enhancement showed invasive lobular carcinoma grade 1 that was ER/PR positive HER2 negative with a Ki-67 of less than 1.  She underwent a left mastectomy with sentinel lymph node biopsy on 01/31/2022.  A biopsy of her nipple showed benign changes.  Her left mastectomy specimen showed a invasive lobular carcinoma that was grade 1 but this measured 6.1 cm in greatest dimension.  Her margins were negative with the closest being 1 mm from the nipple margin and associated LCIS.  2 specimens from the left axilla were submitted 1 showed benign fibroadipose tissue with no nodal material and 1 lymph node  was seen which was also negative. Re-excision of the breast including removal of the nipple complex showed no residual disease on 02/18/22. Oncotype DX score was 10 and no systemic chemotherapy is recommended.  Given pT3 disease, she is seen to discuss postmastectomy radiotherapy.    PREVIOUS RADIATION THERAPY: No   PAST MEDICAL HISTORY:  Past Medical History:  Diagnosis Date   Abnormal pap    Allergy    Breast cancer ([place]) 12/14/2021   left   DIAPHORESIS 12/27/2009   Qualifier: Diagnosis of  By: Burnett Kanaris     Hypertension    Palpitations 12/27/2009   no current problems, Qualifier: Diagnosis of  By: Burnett Kanaris   Shortness of breath 12/27/2009   Qualifier: Diagnosis of  By: Burnett Kanaris     SUPRAVENTRICULAR TACHYCARDIA 12/27/2009   had ablation - Qualifier: Diagnosis of  By: Burnett Kanaris       PAST SURGICAL HISTORY: Past Surgical History:  Procedure Laterality Date   BREAST BIOPSY Left 12/14/2021   BREAST RECONSTRUCTION WITH PLACEMENT OF TISSUE EXPANDER AND ALLODERM Left 01/31/2022   Procedure: BREAST RECONSTRUCTION WITH PLACEMENT OF TISSUE EXPANDER AND ALLODERM;  Surgeon: Irene Limbo, MD;  Location: [facility];  Service: Plastics;  Laterality: Left;   CARPAL TUNNEL WITH CUBITAL TUNNEL Bilateral    EYE SURGERY  2006   INCISION AND DRAINAGE OF WOUND Left 02/18/2022   Procedure: IRRIGATION AND DEBRIDEMENT WOUND-LEFT BREAST;  Surgeon: Irene Limbo, MD;  Location: [facility];  Service: Plastics;  Laterality: Left;   KNEE SURGERY     MASTECTOMY W/ SENTINEL NODE BIOPSY Left 01/31/2022   Procedure: LEFT NIPPLE SPARING MASTECTOMY AND SENTINEL LYMPH NODE BIOPSY;  Surgeon: Coralie Keens, MD;  Location: [facility];  Service: General;  Laterality: Left;   REFRACTIVE SURGERY     SVT ABLATION N/A 05/21/2020   Procedure: SVT ABLATION;  Surgeon: Vickie Epley, MD;  Location: [facility];  Service: Cardiovascular;   Laterality: N/A;   WISDOM TOOTH EXTRACTION       FAMILY HISTORY:  Family History  Problem Relation Age of Onset   Chronic granulomatous disease Mother    Diabetes Mother    Chronic granulomatous disease Father    Breast cancer Paternal Aunt 34       died at 29   Diabetes Paternal Grandmother    Heart disease Paternal Grandmother      SOCIAL HISTORY:  reports that she has never smoked. She has never used smokeless tobacco. She reports current alcohol use of about 5.0 - 10.0 standard drinks of alcohol per week. She reports that she does not use drugs. The patient is married and lives in [place]. She's accompanied by her husband.    ALLERGIES: Codeine, Demerol [meperidine], Oxycodone, and Versed [midazolam]   MEDICATIONS:  Current Outpatient Medications  Medication Sig Dispense Refill   acetaminophen (TYLENOL) 500 MG tablet Take 500-1,000 mg by mouth every 6 (six) hours as needed (pain.).     chlorthalidone (HYGROTON) 25 MG tablet Take 25 mg by mouth daily.     Cholecalciferol (VITAMIN D3) 1.25 MG (50000 UT) CAPS Take 50,000 Units by mouth once a week.     ibuprofen (ADVIL,MOTRIN) 600 MG tablet Take 400-600 mg by mouth every 6 (six) hours as needed for headache.     methocarbamol (ROBAXIN) 500 MG tablet Take 500 mg by mouth 3 (three) times daily as needed for spasms.     sulfamethoxazole-trimethoprim (BACTRIM DS) 800-160 MG tablet Take 1 tablet by mouth 2 (two) times daily. 14 tablet 0   No current facility-administered medications for this visit.     REVIEW OF SYSTEMS: On review of systems, the patient reports that she is doing well surgically, and in the phase of expasion with Dr. Iran Planas. She has some left chest wall tenderness and fullness along her prior JP site. No other complaints are noted.      PHYSICAL EXAM:  Wt Readings from Last 3 Encounters:  02/18/22 139 lb 12.4 oz (63.4 kg)  01/31/22 139 lb 12.4 oz (63.4 kg)  12/29/21 139 lb 8 oz (63.3 kg)   Temp  Readings from Last 3 Encounters:  02/18/22 98 F (36.7 C)  02/01/22 99.1 F (37.3 C)  12/29/21 98 F (36.7 C) (Oral)   BP Readings from Last 3 Encounters:  02/18/22 115/75  02/01/22 130/77  12/29/21 132/89   Pulse Readings from Last 3 Encounters:  02/18/22 (!) 57  02/01/22 79  12/29/21 94    In general this is a well appearing caucasian female in no acute distress. She's alert and oriented x4 and appropriate throughout the examination. Cardiopulmonary assessment is negative for acute distress and she exhibits normal effort. The left chest wall shows a well healed mastectomy scar line and in situ expander with mild fullness of the  lateral chest wall without fluctuance.    ECOG = 1  0 - Asymptomatic (Fully active, able to carry on all predisease activities without restriction)  1 - Symptomatic but completely  ambulatory (Restricted in physically strenuous activity but ambulatory and able to carry out work of a light or sedentary nature. For example, light housework, office work)  2 - Symptomatic, <50% in bed during the day (Ambulatory and capable of all self care but unable to carry out any work activities. Up and about more than 50% of waking hours)  3 - Symptomatic, >50% in bed, but not bedbound (Capable of only limited self-care, confined to bed or chair 50% or more of waking hours)  4 - Bedbound (Completely disabled. Cannot carry on any self-care. Totally confined to bed or chair)  5 - Death   Eustace Pen MM, Creech RH, Tormey DC, et al. [phone]). "Toxicity and response criteria of the [facility] Group". [place] Oncol. 5 (6): 649-55    LABORATORY DATA:  Lab Results  Component Value Date   WBC 6.4 02/18/2022   HGB 15.2 (H) 02/18/2022   HCT 44.3 02/18/2022   MCV 98.2 02/18/2022   PLT 256 02/18/2022   Lab Results  Component Value Date   NA 140 02/18/2022   K 4.0 02/18/2022   CL 103 02/18/2022   CO2 21 (L) 02/18/2022   No results found for: "ALT",  "AST", "GGT", "ALKPHOS", "BILITOT"    RADIOGRAPHY: No results found.     IMPRESSION/PLAN: 1. Stage IA, pT3N0M0, grade 1  invasive lobular carcinoma of the left breast. Dr. Lisbeth Renshaw has reviewed her final pathology findings and today we discussed her final pathology results and how her features and tumor size would be benefited by adjuvant therapy.  Dr. Lisbeth Renshaw does recommend external beam radiotherapy to the chest wall and regional lymph nodes to reduce risks of local recurrence as well as antiestrogen therapy.  She is also aware of the recommendation for delayed breast implant placement following conclusion of radiotherapy.  We discussed the risks, benefits, short, and long term effects of radiotherapy, as well as the curative intent. Dr. Lisbeth Renshaw discusses the delivery and logistics of radiotherapy and anticipates a course of 6 1/2 weeks of radiotherapy to the left chest wall and regional lymph nodes with deep inspiration breath hold technique. She would like to take some time to think about her options and will contact us next week to decide how she'd like to move forward. If she's in agreement to move forward, the patient will be contacted to coordinate treatment planning by our simulation department once her expansion is complete with plastic surgery.    In a visit lasting 90 minutes, greater than 50% of the time was spent face to face reviewing her case, as well as in preparation of, discussing, and coordinating the patient's care.      Carola Rhine, [facility]    **Disclaimer: This note was dictated with voice recognition software. Similar sounding words can inadvertently be transcribed and this note may contain transcription errors which may not have been corrected upon publication of note.**

## 2022-02-21 LAB — SURGICAL PATHOLOGY

## 2022-02-22 ENCOUNTER — Ambulatory Visit
Admission: RE | Admit: 2022-02-22 | Discharge: 2022-02-22 | Disposition: A | Discharge status: Home / Self Care | Payer: 59 | Source: Ambulatory Visit | Attending: Radiation Oncology | Admitting: Radiation Oncology

## 2022-02-22 ENCOUNTER — Other Ambulatory Visit: Payer: Self-pay

## 2022-02-22 ENCOUNTER — Encounter: Payer: Self-pay | Admitting: Hematology

## 2022-02-22 ENCOUNTER — Encounter: Payer: Self-pay | Admitting: Radiation Oncology

## 2022-02-22 ENCOUNTER — Ambulatory Visit: Payer: 59 | Admitting: Radiation Oncology

## 2022-02-22 VITALS — BP 122/80 | HR 100 | Temp 98.5°F | Resp 19 | Ht <= 58 in | Wt 141.0 lb

## 2022-02-22 DIAGNOSIS — Z170 Estrogen receptor positive status [ER+]: Secondary | ICD-10-CM

## 2022-02-22 DIAGNOSIS — I10 Essential (primary) hypertension: Secondary | ICD-10-CM | POA: Diagnosis not present

## 2022-02-22 DIAGNOSIS — Z803 Family history of malignant neoplasm of breast: Secondary | ICD-10-CM | POA: Insufficient documentation

## 2022-02-22 DIAGNOSIS — C50412 Malignant neoplasm of upper-outer quadrant of left female breast: Secondary | ICD-10-CM | POA: Diagnosis present

## 2022-02-22 DIAGNOSIS — R0602 Shortness of breath: Secondary | ICD-10-CM | POA: Insufficient documentation

## 2022-02-22 DIAGNOSIS — Z9012 Acquired absence of left breast and nipple: Secondary | ICD-10-CM | POA: Diagnosis not present

## 2022-02-22 NOTE — Progress Notes (Addendum)
Follow-up-new appointment. I verified patient's identity and began nursing interview w/ spouse Mr. Avice Funchess in attendance. Mild LT breast tenderness 3/10 at drain incision line, due to pressure from compression bra.   Meaningful use complete. Perimenopausal- NO chances of pregnancy. Last menstrual April 23rd, 2023  BP 122/80 (BP Location: Right Arm, Patient Position: Sitting, Cuff Size: Normal)   Pulse 100   Temp 98.5 F (36.9 C) (Temporal)   Resp 19   Ht '4\' 9"'$  (1.448 m)   Wt 141 lb (64 kg)   LMP 12/01/2021 (Approximate)   SpO2 100%   BMI 30.51 kg/m

## 2022-02-23 ENCOUNTER — Encounter (HOSPITAL_COMMUNITY): Payer: Self-pay

## 2022-02-24 ENCOUNTER — Other Ambulatory Visit: Payer: Self-pay

## 2022-02-24 ENCOUNTER — Inpatient Hospital Stay: Payer: 59 | Attending: Hematology | Admitting: Hematology

## 2022-02-24 ENCOUNTER — Encounter: Payer: Self-pay | Admitting: Hematology

## 2022-02-24 VITALS — BP 126/86 | HR 96 | Temp 98.5°F | Resp 16 | Wt 143.3 lb

## 2022-02-24 DIAGNOSIS — Z9012 Acquired absence of left breast and nipple: Secondary | ICD-10-CM | POA: Insufficient documentation

## 2022-02-24 DIAGNOSIS — I10 Essential (primary) hypertension: Secondary | ICD-10-CM | POA: Diagnosis not present

## 2022-02-24 DIAGNOSIS — Z170 Estrogen receptor positive status [ER+]: Secondary | ICD-10-CM | POA: Diagnosis not present

## 2022-02-24 DIAGNOSIS — C50412 Malignant neoplasm of upper-outer quadrant of left female breast: Secondary | ICD-10-CM | POA: Insufficient documentation

## 2022-02-24 NOTE — Progress Notes (Signed)
[place]   [phone] [phone]   Clinic Follow up Note   Patient Care Team: Delsa Bern, MD as PCP - General (Obstetrics and Gynecology) Vickie Epley, MD as PCP - Electrophysiology (Cardiology) Vickie Epley, MD as PCP - Cardiology (Cardiology) Coralie Keens, MD as Consulting Physician (General Surgery) Truitt Merle, MD as Consulting Physician (Hematology) Kyung Rudd, MD as Consulting Physician (Radiation Oncology) Rockwell Germany, RN as Oncology Nurse Navigator Mauro Kaufmann, RN as Oncology Nurse Navigator Mauro Kaufmann, RN as Oncology Nurse Navigator Rockwell Germany, RN as Oncology Nurse Navigator 02/24/2022  CHIEF COMPLAINT:   ASSESSMENT & PLAN:  Autumn Wyatt is a 56 y.o. peri-menopausal female with a history of SVT, HTN   1. 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 Left MM and [place] on 12/13/21 showed 1.1 cm mass at 2 o'clock. Biopsy on 12/14/21 showed invasive and in situ carcinoma, e-cadherin negative supporting lobular histology. -CT underwent left mastectomy and sentinel lymph node biopsy on February 20, 2022. --I discussed her surgical path result in details.  The tumor was 6.1 cm, with negative margins, 1 sentinel lymph nodes was negative. -I discussed that lobular carcinoma sometimes is difficult to see on screening mammogram, and late recurrence is more often than ductal carcinoma. -the Oncotype Dx result was reviewed with her in details. She has low risk based on the recurrence score, which predicts 9 year distant recurrence after 5 years of tamoxifen 3%.  I do not recommend adjuvant chemotherapy. She agrees with the plan -Giving the strong ER and PR expression in her tumor and her premenopausal status, I recommend adjuvant endocrine therapy with tamoxifen for 10 years, or if  she becomes postmenopausal in the next few years, I'll switch her tamoxifen to aromatase inhibitor for additional 5 years, to reduce the risk of cancer recurrence. The benefit and risks of antiestrogen therapy were discussed with her, she agrees to proceed. -Patient was on birth control pill for many years before cancer diagnosis, and did have vaginal bleeding after she stopped birth control.  Per patient, she had her [place] level checked at her gynecologist office which was very high, but she was told she is postmenopausal.  I plan to discuss with her gynecologist.  If she is truly postmenopausal, I would recommend aromatase inhibitor for 10 years. -She has met radiation oncologist Dr. Lisbeth Renshaw and discussed postmastectomy radiation.  Given the large size of tumor, adjuvant radiation was recommended.  However patient and her husband are not satisfied by the reasoning of radiation and also very concerned the potential side effect of radiation on her reconstruction surgery.  She has requested a second opinion at [facility].  -Plan to start antiestrogen therapy after radiation, or in a few weeks if she decided to forgo radiation.  2. Genetics  -Her maternal aunt had breast cancer at age of 54, she qualifies for genetic testing. -She declined genetic testing at this point, may consider it in future when she has better insurance coverage    PLAN:  -I reviewed her surgical pathology results, and Oncotype results.  I do not recommend adjuvant chemotherapy -She was referred to [facility] radiation oncology for second opinion from postmastectomy radiation.  She will call me about her decision radiation.  Plan to start antiestrogen therapy after radiation, or in a few weeks if she decides to forego  radiation. -I will discuss with her gynecologist about her menopause status.   SUMMARY OF ONCOLOGIC HISTORY: Oncology History  Malignant neoplasm of upper-outer quadrant of left breast in female, estrogen receptor positive ([place])   12/13/2021 Mammogram   CLINICAL DATA:  56 year old female for further evaluation of possible LEFT breast distortion on screening mammogram.   EXAM: DIGITAL DIAGNOSTIC UNILATERAL LEFT MAMMOGRAM WITH TOMOSYNTHESIS AND CAD; ULTRASOUND LEFT BREAST LIMITED  IMPRESSION: 1. Highly suspicious 1.1 cm UPPER-OUTER LEFT breast mass. Tissue sampling is recommended. 2. No abnormal appearing LEFT axillary lymph nodes.   12/14/2021 Initial Biopsy   Diagnosis Breast, left, needle core biopsy, 2:00 5cmfn - INVASIVE MAMMARY CARCINOMA, GRADE 1/2. - MAMMARY CARCINOMA IN SITU. - SEE NOTE. Diagnosis Note The greatest tumor dimension is 0.9 cm.  Addendum: Immunohistochemistry for E-cadherin is negative consistent with lobular carcinoma.  PROGNOSTIC INDICATORS Results: The tumor cells are EQUIVOCAL for Her2 (2+). Her2 by FISH will be performed and the results reported separately. Estrogen Receptor: 70%, POSITIVE, STRONG STAINING INTENSITY Progesterone Receptor: 90%, POSITIVE, STRONG STAINING INTENSITY Proliferation Marker Ki67: 1%  FLUORESCENCE IN-SITU HYBRIDIZATION Results: GROUP 5: HER2 **NEGATIVE**   12/26/2021 Initial Diagnosis   Malignant neoplasm of upper-outer quadrant of left breast in female, estrogen receptor positive ([place])   02/20/2022 Cancer Staging   Staging form: Breast, AJCC 8th Edition - Pathologic stage from 02/20/2022: Stage IA (pT3, pN0(sn), cM0, G1, ER+, PR+, HER2-, Oncotype DX score: 10) - Signed by Hayden Pedro, PA-C on 02/20/2022 Stage prefix: Initial diagnosis Method of lymph node assessment: Sentinel lymph node biopsy Multigene prognostic tests performed: Oncotype DX Recurrence score range: Less than 11 Histologic grading system: 3 grade system     CURRENT THERAPY: pending   INTERVAL HISTORY: Autumn Wyatt returns for follow-up. She underwent left mastectomy and tissue expander placement on February 08, 2022.  She subsequently had skin necrosis, and had a second surgery  on February 18, 2022 for debridement.  She is recovering well from surgery, no significant pain or other complaints.  She is here to discuss adjuvant therapy.  She presents to clinic with her husband.   REVIEW OF SYSTEMS:   Constitutional: Denies fevers, chills or abnormal weight loss Eyes: Denies blurriness of vision Ears, nose, mouth, throat, and face: Denies mucositis or sore throat Respiratory: Denies cough, dyspnea or wheezes Cardiovascular: Denies palpitation, chest discomfort or lower extremity swelling Gastrointestinal:  Denies nausea, heartburn or change in bowel habits Skin: Denies abnormal skin rashes Lymphatics: Denies new lymphadenopathy or easy bruising Neurological:Denies numbness, tingling or new weaknesses Behavioral/Psych: Mood is stable, no new changes  All other systems were reviewed with the patient and are negative.  MEDICAL HISTORY:  Past Medical History:  Diagnosis Date   Abnormal pap    Allergy    Breast cancer ([place]) 12/14/2021   left   DIAPHORESIS 12/27/2009   Qualifier: Diagnosis of  By: Burnett Kanaris     Hypertension    Palpitations 12/27/2009   no current problems, Qualifier: Diagnosis of  By: Burnett Kanaris   Shortness of breath 12/27/2009   Qualifier: Diagnosis of  By: Burnett Kanaris     SUPRAVENTRICULAR TACHYCARDIA 12/27/2009   had ablation - Qualifier: Diagnosis of  By: Burnett Kanaris    SURGICAL HISTORY: Past Surgical History:  Procedure Laterality Date   BREAST BIOPSY Left 12/14/2021   BREAST RECONSTRUCTION WITH PLACEMENT OF TISSUE EXPANDER AND ALLODERM Left 01/31/2022   Procedure: BREAST RECONSTRUCTION WITH PLACEMENT OF TISSUE EXPANDER AND ALLODERM;  Surgeon: Irene Limbo, MD;  Location: [place];  Service: Plastics;  Laterality: Left;   CARPAL TUNNEL WITH CUBITAL TUNNEL Bilateral    EYE SURGERY  2006   INCISION AND DRAINAGE OF WOUND Left 02/18/2022   Procedure: IRRIGATION AND DEBRIDEMENT WOUND-LEFT BREAST;   Surgeon: Irene Limbo, MD;  Location: [place];  Service: Plastics;  Laterality: Left;   KNEE SURGERY     MASTECTOMY W/ SENTINEL NODE BIOPSY Left 01/31/2022   Procedure: LEFT NIPPLE SPARING MASTECTOMY AND SENTINEL LYMPH NODE BIOPSY;  Surgeon: Coralie Keens, MD;  Location: [place];  Service: General;  Laterality: Left;   REFRACTIVE SURGERY     SVT ABLATION N/A 05/21/2020   Procedure: SVT ABLATION;  Surgeon: Vickie Epley, MD;  Location: [place] [facility];  Service: Cardiovascular;  Laterality: N/A;   WISDOM TOOTH EXTRACTION      I have reviewed the social history and family history with the patient and they are unchanged from previous note.  ALLERGIES:  is allergic to codeine, demerol [meperidine], oxycodone, and versed [midazolam].  MEDICATIONS:  Current Outpatient Medications  Medication Sig Dispense Refill   acetaminophen (TYLENOL) 500 MG tablet Take 500-1,000 mg by mouth every 6 (six) hours as needed (pain.).     chlorthalidone (HYGROTON) 25 MG tablet Take 25 mg by mouth daily. (Patient not taking: Reported on 02/22/2022)     Cholecalciferol (VITAMIN D3) 1.25 MG (50000 UT) CAPS Take 50,000 Units by mouth once a week. (Patient not taking: Reported on 02/22/2022)     ibuprofen (ADVIL,MOTRIN) 600 MG tablet Take 400-600 mg by mouth every 6 (six) hours as needed for headache.     methocarbamol (ROBAXIN) 500 MG tablet Take 500 mg by mouth 3 (three) times daily as needed for spasms. (Patient not taking: Reported on 02/22/2022)     sulfamethoxazole-trimethoprim (BACTRIM DS) 800-160 MG tablet Take 1 tablet by mouth 2 (two) times daily. 14 tablet 0   No current facility-administered medications for this visit.    PHYSICAL EXAMINATION: ECOG PERFORMANCE STATUS: 1 - Symptomatic but completely ambulatory  Vitals:   02/24/22 1013  BP: 126/86  Pulse: 96  Resp: 16  Temp: 98.5 F (36.9 C)  SpO2: 100%   Filed Weights   02/24/22 1013  Weight: 143 lb 5 oz (65 kg)     GENERAL:alert, no distress and comfortable SKIN: skin color, texture, turgor are normal, no rashes or significant lesions EYES: normal, Conjunctiva are pink and non-injected, sclera clear OROPHARYNX:no exudate, no erythema and lips, buccal mucosa, and tongue normal  NECK: supple, thyroid normal size, non-tender, without nodularity LYMPH:  no palpable lymphadenopathy in the cervical, axillary or inguinal LUNGS: clear to auscultation and percussion with normal breathing effort HEART: regular rate & rhythm and no murmurs and no lower extremity edema ABDOMEN:abdomen soft, non-tender and normal bowel sounds Musculoskeletal:no cyanosis of digits and no clubbing  NEURO: alert & oriented x 3 with fluent speech, no focal motor/sensory deficits  LABORATORY DATA:  I have reviewed the data as listed    Latest Ref Rng & Units 02/18/2022    6:18 AM 05/19/2020   10:45 AM 01/05/2010   12:23 PM  CBC  WBC 4.0 - 10.5 K/uL 6.4  10.5    Hemoglobin 12.0 - 15.0 g/dL 15.2  14.7  14.2   Hematocrit 36.0 - 46.0 % 44.3  43.3    Platelets 150 - 400 K/uL 256  210          Latest Ref Rng & Units 02/18/2022  6:18 AM 01/24/2022    1:30 PM 05/19/2020   10:45 AM  CMP  Glucose 70 - 99 mg/dL 107  96  89   BUN 6 - 20 mg/dL 15  12  11    Creatinine 0.44 - 1.00 mg/dL 0.51  0.50  0.53   Sodium 135 - 145 mmol/L 140  140  138   Potassium 3.5 - 5.1 mmol/L 4.0  3.9  3.8   Chloride 98 - 111 mmol/L 103  109  105   CO2 22 - 32 mmol/L 21  24  24    Calcium 8.9 - 10.3 mg/dL 10.0  9.2  9.2       RADIOGRAPHIC STUDIES: I have personally reviewed the radiological images as listed and agreed with the findings in the report. No results found.   ASSESSMENT & PLAN:  No problem-specific Assessment & Plan notes found for this encounter.   No orders of the defined types were placed in this encounter.  Patient's husband had many questions.  All questions were answered. The patient knows to call the clinic with any problems,  questions or concerns. No barriers to learning was detected. I spent 30 minutes counseling the patient face to face. The total time spent in the appointment was 40 minutes and more than 50% was on counseling and review of test results     Truitt Merle, MD 02/24/22

## 2022-02-24 NOTE — Addendum Note (Signed)
Encounter addended by: Hayden Pedro, PA-C on: 02/24/2022 6:57 AM  Actions taken: Clinical Note Signed

## 2022-02-27 ENCOUNTER — Encounter: Payer: Self-pay | Admitting: *Deleted

## 2022-03-01 ENCOUNTER — Other Ambulatory Visit: Payer: Self-pay

## 2022-03-01 ENCOUNTER — Telehealth: Payer: Self-pay

## 2022-03-01 ENCOUNTER — Telehealth: Payer: Self-pay | Admitting: Hematology

## 2022-03-01 NOTE — Telephone Encounter (Signed)
LVM message for Dr. Boyd Kerbs Triage nurse again requesting if Dr. Cletis Media would please contact call Dr. Burr Medico on her cellphone regarding this pt's [facility] being very high and she had vaginal bleeding after stopping OCP.  Dr. Burr Medico also want to know if the pt post-menopausal.  Dr. Burr Medico is awaiting Dr. Boyd Kerbs return call.

## 2022-03-01 NOTE — Telephone Encounter (Signed)
I spoke with Dr. Cletis Media today regarding her menopausal status.  Patient was on oral contraceptives, stopped a few months ago, and had vaginal bleeding after that, which is normal per Dr. Cletis Media. Her [place] (after stopping oral contraceptive) was in 70's which indicating she is postmenopausal.  Her [facility] lab results and last office visit with Dr. Cletis Media will be faxed to me.   Truitt Merle  03/01/2022

## 2022-03-02 ENCOUNTER — Encounter: Payer: Self-pay | Admitting: Radiation Oncology

## 2022-03-02 NOTE — Progress Notes (Signed)
I called the patient to follow-up to see if she had made any decisions regarding radiation.  She has not made any decisions yet but is also seeking a second opinion at [facility].  Dr. Ninfa Linden was coordinating this referral for her and she is hoping to hear about when she can be seen soon.  I asked that she call me back once she has made a decision so that we can either coordinate treatment in our center or just be available to her as needed .

## 2022-03-06 ENCOUNTER — Encounter: Payer: Self-pay | Admitting: *Deleted

## 2022-03-06 ENCOUNTER — Ambulatory Visit: Payer: Self-pay

## 2022-03-10 ENCOUNTER — Encounter: Payer: Self-pay | Admitting: *Deleted

## 2022-03-13 ENCOUNTER — Encounter: Payer: Self-pay | Admitting: *Deleted

## 2022-03-13 NOTE — Therapy (Signed)
OUTPATIENT PHYSICAL THERAPY BREAST CANCER POST OP FOLLOW UP   Patient Name: Autumn Wyatt MRN: [id number] DOB:1965/09/22, 56 y.o., female Today's Date: 03/14/2022   PT End of Session - 03/14/22 0843     Visit Number 2    Number of Visits 4    Date for PT Re-Evaluation 04/11/22    PT Start Time 0803    PT Stop Time 0833    PT Time Calculation (min) 30 min    Activity Tolerance Patient tolerated treatment well    Behavior During Therapy [facility] for tasks assessed/performed             Past Medical History:  Diagnosis Date   Abnormal pap    Allergy    Breast cancer ([place]) 12/14/2021   left   DIAPHORESIS 12/27/2009   Qualifier: Diagnosis of  By: Burnett Kanaris     Hypertension    Palpitations 12/27/2009   no current problems, Qualifier: Diagnosis of  By: Burnett Kanaris   Shortness of breath 12/27/2009   Qualifier: Diagnosis of  By: Burnett Kanaris     SUPRAVENTRICULAR TACHYCARDIA 12/27/2009   had ablation - Qualifier: Diagnosis of  By: Burnett Kanaris   Past Surgical History:  Procedure Laterality Date   BREAST BIOPSY Left 12/14/2021   BREAST RECONSTRUCTION WITH PLACEMENT OF TISSUE EXPANDER AND ALLODERM Left 01/31/2022   Procedure: BREAST RECONSTRUCTION WITH PLACEMENT OF TISSUE EXPANDER AND ALLODERM;  Surgeon: Irene Limbo, MD;  Location: [place];  Service: Plastics;  Laterality: Left;   CARPAL TUNNEL WITH CUBITAL TUNNEL Bilateral    EYE SURGERY  2006   INCISION AND DRAINAGE OF WOUND Left 02/18/2022   Procedure: IRRIGATION AND DEBRIDEMENT WOUND-LEFT BREAST;  Surgeon: Irene Limbo, MD;  Location: [place];  Service: Plastics;  Laterality: Left;   KNEE SURGERY     MASTECTOMY W/ SENTINEL NODE BIOPSY Left 01/31/2022   Procedure: LEFT NIPPLE SPARING MASTECTOMY AND SENTINEL LYMPH NODE BIOPSY;  Surgeon: Coralie Keens, MD;  Location: [place];  Service: General;  Laterality: Left;   REFRACTIVE SURGERY     SVT ABLATION N/A  05/21/2020   Procedure: SVT ABLATION;  Surgeon: Vickie Epley, MD;  Location: [place] [facility];  Service: Cardiovascular;  Laterality: N/A;   WISDOM TOOTH EXTRACTION     Patient Active Problem List   Diagnosis Date Noted   Breast cancer, left breast ([place]) 01/31/2022   Malignant neoplasm of upper-outer quadrant of left breast in female, estrogen receptor positive ([place]) 12/26/2021   SVT (supraventricular tachycardia) ([place]) 12/27/2009   DIAPHORESIS 12/27/2009   PALPITATIONS 12/27/2009   SHORTNESS OF BREATH 12/27/2009    PCP: Dr. Delsa Bern  REFERRING PROVIDER: Dr. Coralie Keens  REFERRING DIAG: Lt breast cancer  THERAPY DIAG:  Malignant neoplasm of upper-outer quadrant of left breast in female, estrogen receptor positive ([place])  Abnormal posture  Aftercare following surgery for neoplasm  Acute pain of left shoulder  Rationale for Evaluation and Treatment Rehabilitation  ONSET DATE: 12/14/21  SUBJECTIVE:  SUBJECTIVE STATEMENT: Plastic surgery release me restrictions 2 weeks ago.  When can I play pickleball?   PERTINENT HISTORY:  Pt had a left nipple sparing mastectomy with expander placement on 02/08/22 with 1 negative LN removed. 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 Radiation is recommended with pt deciding. 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 It measures 1.1 cm and is located in the upper outer  quadrant. It is ER+, PR+ and HER 2- with a Ki67 of 1%. She has a family history of breast cancer in her paternal aunt who died in her 62s from breast cancer.She has a history of SVT and had a heart ablation many years ago and has had no cardiac issues   PATIENT GOALS:  Reassess how my recovery is going related to arm function, pain,  and swelling.  PAIN:  Are you having pain? Not much pain - it is more tight    PRECAUTIONS: Recent Surgery, left UE Lymphedema risk  ACTIVITY LEVEL / LEISURE: limiting gym activities    OBJECTIVE:  PATIENT SURVEYS:  QUICK DASH: 2.27%   OBSERVATIONS:  Healing well with scabbing along the anterior breast  POSTURE:  Rounded shoulders   LYMPHEDEMA ASSESSMENT:   UPPER EXTREMITY AROM/PROM:   A/PROM RIGHT   Eval 01/02/2022   03/14/22  Shoulder extension 59   Shoulder flexion 165   Shoulder abduction 178   Shoulder internal rotation 55   Shoulder external rotation 117                           (Blank rows = not tested)   A/PROM LEFT   Eval 01/02/2022 03/14/22  Shoulder extension 57 57  Shoulder flexion 167 167  Shoulder abduction 178 167 - stretching in the pectoralis and some anterior shoulder pinch  Shoulder internal rotation 60 70  Shoulder external rotation 115 100                          (Blank rows = not tested)     LYMPHEDEMA ASSESSMENTS:    [facility] RIGHT   Eval 01/02/2022 03/14/22  10 cm proximal to olecranon process 31.1 31.1  Olecranon process 23.2 23.2  10 cm proximal to ulnar styloid process 22.2 22  Just proximal to ulnar styloid process 14.4 14.4  Across hand at thumb web space 17.2 17.2  At base of 2nd digit 5.8 5.8  (Blank rows = not tested)   [facility] LEFT   Eval 01/02/2022 03/14/22  10 cm proximal to olecranon process 31.0 31  Olecranon process 22.85 23  10 cm proximal to ulnar styloid process 21.3 21.3  Just proximal to ulnar styloid process 14.45 14.5  Across hand at thumb web space 16.9 16.8  At base of 2nd digit 5.7 6.0  (Blank rows = not tested)    PATIENT EDUCATION:  Education details: new HEP per below Person educated: Patient Education method: Consulting civil engineer, Demonstration, Corporate treasurer cues, Verbal cues, and Handouts Education comprehension: verbalized understanding and returned demonstration  HOME EXERCISE PROGRAM: Access Code:  [email or web address] URL: [email or web address] Date: 03/14/2022 Prepared by: Shan Levans  Exercises - Hooklying Rib Cage Breathing  - 1 x daily - 7 x weekly - 1 sets - 10 reps - Supine Lower Trapezius Strengthening  - 1 x daily - 7 x weekly - 1 sets - 5 reps - Supine Bilateral Shoulder Protraction  - 1 x daily - 7 x  weekly - 1 sets - 10 reps - Pilates Bridge  - 1 x daily - 7 x weekly - 1 sets - 10 reps - Supine Shoulder Flexion with Dowel  - 1 x daily - 7 x weekly - 1 sets - 5-10 reps - Standing Row with Anchored Resistance  - 1 x daily - 3-4 x weekly - 1-3 sets - 10 reps - 2-3 second hold  ASSESSMENT:  CLINICAL IMPRESSION: Pt presents 4 weeks post Left mastectomy with SLNB with expander placement.  Pt is doing very well but is having some limited abduction with impingement symptoms at end range.  Due to being so active we updated HEP to include some scapular re-education and pilates movements and will see pt again in 2 weeks for transition to supine scap vs strength ABC.    Pt will benefit from skilled therapeutic intervention to improve on the following deficits: Decreased knowledge of precautions, impaired UE functional use, pain, decreased ROM, postural dysfunction.   PT treatment/interventions: ADL/Self care home management, Therapeutic exercises, Patient/Family education, Self Care, Joint mobilization, Manual therapy, and Re-evaluation     GOALS: Goals reviewed with patient? Yes  LONG TERM GOALS:  (STG=LTG)  GOALS Name Target Date  Goal status  1 Pt will demonstrate she has regained full shoulder ROM and function post operatively compared to baselines.  Baseline: 03/28/2022 INITIAL  2 Pt will be educated on lymphedema and risk reduction through ABC class 03/28/22 INITIAL  3 Pt will be ind with final HEP 03/28/22 INITIAL          PLAN: PT FREQUENCY/DURATION: 1 more visit and follow up as needed  PLAN FOR NEXT SESSION: recheck AROM - advance to scap series or strength  ABC   [facility]  [place], [address]  [place] [address]  [phone]  After Breast Cancer Class It is recommended you attend the ABC class to be educated on lymphedema risk reduction. This class is free of charge and lasts for 1 hour. It is a 1-time class. You will need to download the Webex app either on your phone or computer. We will send you a link the night before or the morning of the class. You should be able to click on that link to join the class. This is not a confidential class. You don't have to turn your camera on, but other participants may be able to see your email address.  Scar massage You can begin gentle scar massage to you incision sites. Gently place one hand on the incision and move the skin (without sliding on the skin) in various directions. Do this for a few minutes and then you can gently massage either coconut oil or vitamin E cream into the scars.  Compression garment You should continue wearing your compression bra until you feel like you no longer have swelling.  Home exercise Program Continue doing the exercises you were given until you feel like you can do them without feeling any tightness at the end.   Walking Program Studies show that 30 minutes of walking per day (fast enough to elevate your heart rate) can significantly reduce the risk of a cancer recurrence. If you can't walk due to other medical reasons, we encourage you to find another activity you could do (like a stationary bike or water exercise).  Posture After breast cancer surgery, people frequently sit with rounded shoulders posture because it puts their incisions on slack and feels better. If you sit like this  and scar tissue forms in that position, you can become very tight and have pain sitting or standing with good posture. Try to be aware of your posture and sit and stand up tall to heal properly.  Follow up PT: It is recommended you return every 3 months  for the first 3 years following surgery to be assessed on the SOZO machine for an L-Dex score. This helps prevent clinically significant lymphedema in 95% of patients. These follow up screens are 10 minute appointments that you are not billed for.  Stark Bray, PT 03/14/2022, 8:43 AM

## 2022-03-14 ENCOUNTER — Ambulatory Visit: Payer: 59 | Attending: Surgery | Admitting: Rehabilitation

## 2022-03-14 ENCOUNTER — Encounter: Payer: Self-pay | Admitting: Rehabilitation

## 2022-03-14 DIAGNOSIS — M25512 Pain in left shoulder: Secondary | ICD-10-CM | POA: Insufficient documentation

## 2022-03-14 DIAGNOSIS — C50412 Malignant neoplasm of upper-outer quadrant of left female breast: Secondary | ICD-10-CM | POA: Insufficient documentation

## 2022-03-14 DIAGNOSIS — R293 Abnormal posture: Secondary | ICD-10-CM | POA: Insufficient documentation

## 2022-03-14 DIAGNOSIS — Z483 Aftercare following surgery for neoplasm: Secondary | ICD-10-CM | POA: Insufficient documentation

## 2022-03-14 DIAGNOSIS — Z170 Estrogen receptor positive status [ER+]: Secondary | ICD-10-CM | POA: Diagnosis present

## 2022-03-14 NOTE — Patient Instructions (Signed)
     [facility]  [address], [address]  [place] [address]  [phone]  After Breast Cancer Class It is recommended you attend the ABC class to be educated on lymphedema risk reduction. This class is free of charge and lasts for 1 hour. It is a 1-time class. You will need to download the Webex app either on your phone or computer. We will send you a link the night before or the morning of the class. You should be able to click on that link to join the class. This is not a confidential class. You don't have to turn your camera on, but other participants may be able to see your email address.  Scar massage You can begin gentle scar massage to you incision sites. Gently place one hand on the incision and move the skin (without sliding on the skin) in various directions. Do this for a few minutes and then you can gently massage either coconut oil or vitamin E cream into the scars.  Compression garment You should continue wearing your compression bra until you feel like you no longer have swelling.  Home exercise Program Continue doing the exercises you were given until you feel like you can do them without feeling any tightness at the end.   Walking Program Studies show that 30 minutes of walking per day (fast enough to elevate your heart rate) can significantly reduce the risk of a cancer recurrence. If you can't walk due to other medical reasons, we encourage you to find another activity you could do (like a stationary bike or water exercise).  Posture After breast cancer surgery, people frequently sit with rounded shoulders posture because it puts their incisions on slack and feels better. If you sit like this and scar tissue forms in that position, you can become very tight and have pain sitting or standing with good posture. Try to be aware of your posture and sit and stand up tall to heal properly.  Follow up PT: It is recommended you return every 3 months for  the first 2 years following surgery to be assessed on the SOZO machine for an L-Dex score. This helps prevent clinically significant lymphedema in 95% of patients. These follow up screens are 10 minute appointments that you are not billed for.  

## 2022-03-20 ENCOUNTER — Encounter: Payer: Self-pay | Admitting: *Deleted

## 2022-03-22 ENCOUNTER — Encounter (HOSPITAL_BASED_OUTPATIENT_CLINIC_OR_DEPARTMENT_OTHER): Payer: Self-pay | Admitting: Plastic Surgery

## 2022-03-22 ENCOUNTER — Other Ambulatory Visit: Payer: Self-pay

## 2022-03-22 NOTE — H&P (Signed)
Subjective:     Patient ID: Autumn Wyatt is a 56 y.o. female.   HPI   7 weeks post op. Course complicated by necrosis areola and mastectomy flap sparing nipple. 5 w post excision left NAC and closure. Returns today concern left chest flatter, completed expansion last week.   Presented following screening MMG with left breast distortion. Diagnostic MMG/US showed a 1.1 x 1 x 0.8 cm mass at 2 o'clock LEFT breast 5 cmfn. Axilla normal. 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    MRI demonstrated known malignancy left UOQ measuring 1.3 cm.  Additional NME extends from the anterior aspect of the known malignancy to the base of the nipple, measuring a total of 6.8 cm. MR guided biopsy labeled left breast outer third NME showed ILC ER/PR+ Her2-. Given span of disease, mastectomy recommended.    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 Pathology from [facility] resection/skin flap necrosis benign.    Oncotype 10. Declined genetics. Adjuvant RT recommended.    Prior 32 B. Desires larger. Left mastectomy- no weight specimen recorded at pathology   Lives with spouse. Two adult children. Daughter is ICU RN at [employer].   Review of Systems      Objective:   Physical Exam Cardiovascular:     Rate and Rhythm: Normal rate and regular rhythm.     Heart sounds: Normal heart sounds.  Pulmonary:     Effort: Pulmonary effort is normal.     Breath sounds: Normal breath sounds.     Chest: incision intact no drainage no seroma appreciated  Loss volume, nearly flat expander    Assessment:     Left breast ca UOQ ER+ S/p left NSM SLN prepectoral TE/ADM (Alloderm) reconstruction. S/p debridement mastectomy flap/excision left NAC Left expander deflation    Plan:     Unfortunately I feel her expander has ruptured/deflated. Reviewed this may be related to last expansion. Reviewed if needle entered outside are of port this could have occurred  or if expander flipped anterior posterior. Also possible spontaneous failure. Recommend replacement this week. She was scheduled for CT sim for radiation this week. Message sent to Rad Onc need to delay this. Reviewed surgery through IMF scar, will examine appearance of ADM intra op but do not anticipate need to replace this. Counseled OP surgery, may have drain. Reviewed this will delay start RT as will need to have few weeks healing and complete expansion again.   We discussed repeat fill today. Husband asking if possible fluid during last expansion went into soft tissue and not into expander. Counseled this is low likelihood. Counseled if we did fill today I would still recommend replacement expander as her current volume is smaller than prior to her last expansion.    Husband asking if expander will be sent to company for failure analysis. Counseled I do not know of any process for this with tissue expander.       Natrelle 133S FV-12-T 400 ml tissue expander placed

## 2022-03-23 NOTE — Progress Notes (Signed)

## 2022-03-24 ENCOUNTER — Other Ambulatory Visit: Payer: Self-pay

## 2022-03-24 ENCOUNTER — Ambulatory Visit (HOSPITAL_BASED_OUTPATIENT_CLINIC_OR_DEPARTMENT_OTHER): Payer: 59 | Admitting: Anesthesiology

## 2022-03-24 ENCOUNTER — Encounter: Payer: Self-pay | Admitting: Hematology

## 2022-03-24 ENCOUNTER — Encounter (HOSPITAL_BASED_OUTPATIENT_CLINIC_OR_DEPARTMENT_OTHER): Payer: Self-pay | Admitting: Plastic Surgery

## 2022-03-24 ENCOUNTER — Encounter (HOSPITAL_BASED_OUTPATIENT_CLINIC_OR_DEPARTMENT_OTHER)
Admission: RE | Disposition: A | Discharge status: Home / Self Care | Payer: Self-pay | Source: Home / Self Care | Attending: Plastic Surgery

## 2022-03-24 ENCOUNTER — Ambulatory Visit (HOSPITAL_BASED_OUTPATIENT_CLINIC_OR_DEPARTMENT_OTHER)
Admission: RE | Admit: 2022-03-24 | Discharge: 2022-03-24 | Disposition: A | Discharge status: Home / Self Care | Payer: 59 | Attending: Plastic Surgery | Admitting: Plastic Surgery

## 2022-03-24 ENCOUNTER — Ambulatory Visit: Payer: 59 | Admitting: Radiation Oncology

## 2022-03-24 DIAGNOSIS — T8542XA Displacement of breast prosthesis and implant, initial encounter: Secondary | ICD-10-CM | POA: Diagnosis not present

## 2022-03-24 DIAGNOSIS — X58XXXA Exposure to other specified factors, initial encounter: Secondary | ICD-10-CM | POA: Diagnosis not present

## 2022-03-24 DIAGNOSIS — I10 Essential (primary) hypertension: Secondary | ICD-10-CM | POA: Insufficient documentation

## 2022-03-24 DIAGNOSIS — C50412 Malignant neoplasm of upper-outer quadrant of left female breast: Secondary | ICD-10-CM | POA: Insufficient documentation

## 2022-03-24 DIAGNOSIS — Z170 Estrogen receptor positive status [ER+]: Secondary | ICD-10-CM

## 2022-03-24 DIAGNOSIS — Z79899 Other long term (current) drug therapy: Secondary | ICD-10-CM | POA: Diagnosis not present

## 2022-03-24 DIAGNOSIS — Z01818 Encounter for other preprocedural examination: Secondary | ICD-10-CM

## 2022-03-24 HISTORY — PX: TISSUE EXPANDER PLACEMENT: SHX2530

## 2022-03-24 SURGERY — INSERTION, TISSUE EXPANDER
Anesthesia: General | Site: Breast | Laterality: Left

## 2022-03-24 MED ORDER — EPHEDRINE SULFATE-NACL 50-0.9 MG/10ML-% IV SOSY
PREFILLED_SYRINGE | INTRAVENOUS | Status: DC | PRN
  Administered 2022-03-24: 15 mg via INTRAVENOUS
  Administered 2022-03-24: 10 mg via INTRAVENOUS

## 2022-03-24 MED ORDER — HYDROMORPHONE HCL 1 MG/ML IJ SOLN
INTRAMUSCULAR | Status: AC
  Filled 2022-03-24: qty 0.5

## 2022-03-24 MED ORDER — DEXAMETHASONE SODIUM PHOSPHATE 4 MG/ML IJ SOLN
INTRAMUSCULAR | Status: DC | PRN
  Administered 2022-03-24: 5 mg via INTRAVENOUS

## 2022-03-24 MED ORDER — SODIUM CHLORIDE 0.9 % IV SOLN
INTRAVENOUS | Status: DC | PRN
  Administered 2022-03-24: 512 mL

## 2022-03-24 MED ORDER — PROMETHAZINE HCL 25 MG/ML IJ SOLN: 6.2500 mg | INTRAMUSCULAR | Status: DC | PRN

## 2022-03-24 MED ORDER — FENTANYL CITRATE (PF) 100 MCG/2ML IJ SOLN
INTRAMUSCULAR | Status: DC | PRN
Start: 2022-03-24 — End: 2022-03-24
  Administered 2022-03-24 (×2): 50 ug via INTRAVENOUS

## 2022-03-24 MED ORDER — ONDANSETRON HCL 4 MG/2ML IJ SOLN
INTRAMUSCULAR | Status: DC | PRN
  Administered 2022-03-24: 4 mg via INTRAVENOUS

## 2022-03-24 MED ORDER — HYDROMORPHONE HCL 1 MG/ML IJ SOLN
0.2500 mg | INTRAMUSCULAR | Status: DC | PRN
  Administered 2022-03-24 (×3): 0.5 mg via INTRAVENOUS

## 2022-03-24 MED ORDER — PROPOFOL 10 MG/ML IV BOLUS
INTRAVENOUS | Status: DC | PRN
  Administered 2022-03-24: 150 mg via INTRAVENOUS

## 2022-03-24 MED ORDER — GABAPENTIN 300 MG PO CAPS
ORAL_CAPSULE | ORAL | Status: AC
  Filled 2022-03-24: qty 1

## 2022-03-24 MED ORDER — LIDOCAINE 2% (20 MG/ML) 5 ML SYRINGE
INTRAMUSCULAR | Status: DC | PRN
  Administered 2022-03-24: 40 mg via INTRAVENOUS

## 2022-03-24 MED ORDER — CHLORHEXIDINE GLUCONATE CLOTH 2 % EX PADS: 6.0000 | MEDICATED_PAD | Freq: Once | CUTANEOUS | Status: DC

## 2022-03-24 MED ORDER — LACTATED RINGERS IV SOLN: INTRAVENOUS | Status: DC

## 2022-03-24 MED ORDER — ACETAMINOPHEN 500 MG PO TABS
ORAL_TABLET | ORAL | Status: AC
  Filled 2022-03-24: qty 2

## 2022-03-24 MED ORDER — FENTANYL CITRATE (PF) 100 MCG/2ML IJ SOLN
INTRAMUSCULAR | Status: AC
  Filled 2022-03-24: qty 2

## 2022-03-24 MED ORDER — CELECOXIB 200 MG PO CAPS
200.0000 mg | ORAL_CAPSULE | ORAL | Status: AC
Start: 2022-03-24 — End: 2022-03-24
  Administered 2022-03-24: 200 mg via ORAL

## 2022-03-24 MED ORDER — SODIUM CHLORIDE 0.9 % IV SOLN
INTRAVENOUS | Status: AC
  Filled 2022-03-24: qty 10

## 2022-03-24 MED ORDER — AMISULPRIDE (ANTIEMETIC) 5 MG/2ML IV SOLN: 10.0000 mg | Freq: Once | INTRAVENOUS | Status: DC | PRN

## 2022-03-24 MED ORDER — CEFAZOLIN SODIUM-DEXTROSE 2-4 GM/100ML-% IV SOLN
2.0000 g | INTRAVENOUS | Status: AC
Start: 2022-03-24 — End: 2022-03-24
  Administered 2022-03-24: 2 g via INTRAVENOUS

## 2022-03-24 MED ORDER — CELECOXIB 200 MG PO CAPS
ORAL_CAPSULE | ORAL | Status: AC
  Filled 2022-03-24: qty 1

## 2022-03-24 MED ORDER — ACETAMINOPHEN 500 MG PO TABS
1000.0000 mg | ORAL_TABLET | ORAL | Status: AC
  Administered 2022-03-24: 1000 mg via ORAL

## 2022-03-24 MED ORDER — GABAPENTIN 300 MG PO CAPS
300.0000 mg | ORAL_CAPSULE | ORAL | Status: AC
  Administered 2022-03-24: 300 mg via ORAL

## 2022-03-24 MED ORDER — BUPIVACAINE-EPINEPHRINE (PF) 0.25% -1:200000 IJ SOLN
INTRAMUSCULAR | Status: AC
  Filled 2022-03-24: qty 30

## 2022-03-24 MED ORDER — MIDAZOLAM HCL 2 MG/2ML IJ SOLN
INTRAMUSCULAR | Status: AC
  Filled 2022-03-24: qty 2

## 2022-03-24 MED ORDER — CEFAZOLIN SODIUM-DEXTROSE 2-4 GM/100ML-% IV SOLN
INTRAVENOUS | Status: AC
  Filled 2022-03-24: qty 100

## 2022-03-24 SURGICAL SUPPLY — 65 items
ADH SKN CLS APL DERMABOND .7 (GAUZE/BANDAGES/DRESSINGS) ×1
APL PRP STRL LF DISP 70% ISPRP (MISCELLANEOUS) ×1
BAG DECANTER FOR FLEXI CONT (MISCELLANEOUS) ×2 IMPLANT
BINDER BREAST LRG (GAUZE/BANDAGES/DRESSINGS) ×1 IMPLANT
BINDER BREAST MEDIUM (GAUZE/BANDAGES/DRESSINGS) IMPLANT
BINDER BREAST XLRG (GAUZE/BANDAGES/DRESSINGS) IMPLANT
BINDER BREAST XXLRG (GAUZE/BANDAGES/DRESSINGS) IMPLANT
BLADE SURG 10 STRL SS (BLADE) ×2 IMPLANT
BLADE SURG 15 STRL LF DISP TIS (BLADE) IMPLANT
BLADE SURG 15 STRL SS (BLADE)
BNDG GAUZE DERMACEA FLUFF (GAUZE/BANDAGES/DRESSINGS)
BNDG GAUZE DERMACEA FLUFF 4 (GAUZE/BANDAGES/DRESSINGS) IMPLANT
BNDG GZE DERMACEA 4 6PLY (GAUZE/BANDAGES/DRESSINGS)
CANISTER SUCT 1200ML W/VALVE (MISCELLANEOUS) ×2 IMPLANT
CHLORAPREP W/TINT 26 (MISCELLANEOUS) ×2 IMPLANT
COVER BACK TABLE 60X90IN (DRAPES) ×2 IMPLANT
COVER MAYO STAND STRL (DRAPES) ×2 IMPLANT
DERMABOND ADVANCED (GAUZE/BANDAGES/DRESSINGS) ×1
DERMABOND ADVANCED .7 DNX12 (GAUZE/BANDAGES/DRESSINGS) ×1 IMPLANT
DRAIN CHANNEL 15F RND FF W/TCR (WOUND CARE) IMPLANT
DRAIN CHANNEL 19F RND (DRAIN) IMPLANT
DRAPE TOP ARMCOVERS (MISCELLANEOUS) ×2 IMPLANT
DRAPE U-SHAPE 76X120 STRL (DRAPES) ×2 IMPLANT
DRAPE UTILITY XL STRL (DRAPES) ×2 IMPLANT
DRSG PAD ABDOMINAL 8X10 ST (GAUZE/BANDAGES/DRESSINGS) IMPLANT
ELECT BLADE 4.0 EZ CLEAN MEGAD (MISCELLANEOUS) ×2
ELECT COATED BLADE 2.86 ST (ELECTRODE) ×2 IMPLANT
ELECT REM PT RETURN 9FT ADLT (ELECTROSURGICAL) ×2
ELECTRODE BLDE 4.0 EZ CLN MEGD (MISCELLANEOUS) ×1 IMPLANT
ELECTRODE REM PT RTRN 9FT ADLT (ELECTROSURGICAL) ×1 IMPLANT
EVACUATOR SILICONE 100CC (DRAIN) IMPLANT
EXPANDER TISSUE FV FOURTE 400 (Prosthesis & Implant Plastic) IMPLANT
GLOVE BIO SURGEON STRL SZ 6 (GLOVE) ×4 IMPLANT
GOWN STRL REUS W/ TWL LRG LVL3 (GOWN DISPOSABLE) ×2 IMPLANT
GOWN STRL REUS W/TWL LRG LVL3 (GOWN DISPOSABLE) ×4
IV NS 500ML (IV SOLUTION) ×2
IV NS 500ML BAXH (IV SOLUTION) ×1 IMPLANT
KIT FILL ASEPTIC TRANSFER (MISCELLANEOUS) IMPLANT
NDL HYPO 25X1 1.5 SAFETY (NEEDLE) ×1 IMPLANT
NEEDLE HYPO 25X1 1.5 SAFETY (NEEDLE) ×2 IMPLANT
PACK BASIN DAY SURGERY FS (CUSTOM PROCEDURE TRAY) ×2 IMPLANT
PENCIL SMOKE EVACUATOR (MISCELLANEOUS) ×2 IMPLANT
PIN SAFETY STERILE (MISCELLANEOUS) IMPLANT
SHEET MEDIUM DRAPE 40X70 STRL (DRAPES) ×2 IMPLANT
SLEEVE SCD COMPRESS KNEE MED (STOCKING) ×2 IMPLANT
SPIKE FLUID TRANSFER (MISCELLANEOUS) IMPLANT
SPONGE T-LAP 18X18 ~~LOC~~+RFID (SPONGE) ×2 IMPLANT
STAPLER VISISTAT 35W (STAPLE) ×2 IMPLANT
SUCTION FRAZIER HANDLE 10FR (MISCELLANEOUS)
SUCTION TUBE FRAZIER 10FR DISP (MISCELLANEOUS) IMPLANT
SUT ETHILON 2 0 FS 18 (SUTURE) ×1 IMPLANT
SUT MNCRL AB 4-0 PS2 18 (SUTURE) ×1 IMPLANT
SUT MON AB 3-0 SH 27 (SUTURE) ×2
SUT MON AB 3-0 SH27 (SUTURE) IMPLANT
SUT PDS AB 2-0 CT2 27 (SUTURE) ×1 IMPLANT
SUT VIC AB 3-0 SH 27 (SUTURE) ×2
SUT VIC AB 3-0 SH 27X BRD (SUTURE) IMPLANT
SUT VICRYL 4-0 PS2 18IN ABS (SUTURE) IMPLANT
SYR BULB IRRIG 60ML STRL (SYRINGE) ×4 IMPLANT
SYR CONTROL 10ML LL (SYRINGE) ×2 IMPLANT
TISSUE EXPNDR FV FOURTE 400 (Prosthesis & Implant Plastic) ×2 IMPLANT
TOWEL GREEN STERILE FF (TOWEL DISPOSABLE) ×4 IMPLANT
TUBE CONNECTING 20X1/4 (TUBING) ×4 IMPLANT
UNDERPAD 30X36 HEAVY ABSORB (UNDERPADS AND DIAPERS) ×4 IMPLANT
YANKAUER SUCT BULB TIP NO VENT (SUCTIONS) ×2 IMPLANT

## 2022-03-24 NOTE — Anesthesia Procedure Notes (Signed)
Procedure Name: LMA Insertion Date/Time: 03/24/2022 11:04 AM  Performed by: Willa Frater, CRNAPre-anesthesia Checklist: Patient identified, Emergency Drugs available, Suction available and Patient being monitored Patient Re-evaluated:Patient Re-evaluated prior to induction Oxygen Delivery Method: Circle system utilized Preoxygenation: Pre-oxygenation with 100% oxygen Induction Type: IV induction Ventilation: Mask ventilation without difficulty LMA: LMA inserted LMA Size: 4.0 Number of attempts: 1 Airway Equipment and Method: Bite block Placement Confirmation: positive ETCO2 Tube secured with: Tape Dental Injury: Teeth and Oropharynx as per pre-operative assessment

## 2022-03-24 NOTE — Interval H&P Note (Signed)
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  The various methods of treatment have been discussed with the patient and family. After consideration of risks, benefits and other options for treatment, the patient has consented to  Procedure(s): TISSUE EXPANDER - REMOVAL AND REPLACEMENT (Left) as a surgical intervention.  The patient's history has been reviewed, patient examined, no change in status, stable for surgery.  I have reviewed the patient's chart and labs.  Questions were answered to the patient's satisfaction.     Arnoldo Hooker Autumn Wyatt

## 2022-03-24 NOTE — Anesthesia Preprocedure Evaluation (Signed)
Anesthesia Evaluation  Patient identified by MRN, date of birth, ID band Patient awake    Reviewed: Allergy & Precautions, NPO status , Patient's Chart, lab work & pertinent test results  History of Anesthesia Complications Negative for: history of anesthetic complications  Airway Mallampati: II  TM Distance: >3 FB Neck ROM: Full    Dental  (+) Dental Advisory Given, Teeth Intact   Pulmonary shortness of breath,    Pulmonary exam normal        Cardiovascular hypertension, Pt. on medications Normal cardiovascular exam+ dysrhythmias Supra Ventricular Tachycardia      Neuro/Psych negative neurological ROS  negative psych ROS   GI/Hepatic negative GI ROS, Neg liver ROS,   Endo/Other  negative endocrine ROS  Renal/GU negative Renal ROS     Musculoskeletal negative musculoskeletal ROS (+)   Abdominal   Peds  Hematology negative hematology ROS (+)   Anesthesia Other Findings   Reproductive/Obstetrics  Breast cancer                              Anesthesia Physical  Anesthesia Plan  ASA: 2  Anesthesia Plan: General   Post-op Pain Management: Tylenol PO (pre-op)* and Celebrex PO (pre-op)*   Induction: Intravenous  PONV Risk Score and Plan: 3 and Treatment may vary due to age or medical condition, Ondansetron, Scopolamine patch - Pre-op and Dexamethasone  Airway Management Planned: LMA  Additional Equipment: None  Intra-op Plan:   Post-operative Plan: Extubation in OR  Informed Consent: I have reviewed the patients History and Physical, chart, labs and discussed the procedure including the risks, benefits and alternatives for the proposed anesthesia with the patient or authorized representative who has indicated his/her understanding and acceptance.     Dental advisory given  Plan Discussed with: CRNA  Anesthesia Plan Comments:         Anesthesia Quick Evaluation

## 2022-03-24 NOTE — Discharge Instructions (Addendum)
May take Tylenol after 4pm, if needed. May take NSAIDS (ibuprofen, motrin) after 4pm, if needed.    Post Anesthesia Home Care Instructions  Activity: Get plenty of rest for the remainder of the day. A responsible individual must stay with you for 24 hours following the procedure.  For the next 24 hours, DO NOT: -Drive a car -Paediatric nurse -Drink alcoholic beverages -Take any medication unless instructed by your physician -Make any legal decisions or sign important papers.  Meals: Start with liquid foods such as gelatin or soup. Progress to regular foods as tolerated. Avoid greasy, spicy, heavy foods. If nausea and/or vomiting occur, drink only clear liquids until the nausea and/or vomiting subsides. Call your physician if vomiting continues.  Special Instructions/Symptoms: Your throat may feel dry or sore from the anesthesia or the breathing tube placed in your throat during surgery. If this causes discomfort, gargle with warm salt water. The discomfort should disappear within 24 hours.  If you had a scopolamine patch placed behind your ear for the management of post- operative nausea and/or vomiting:  1. The medication in the patch is effective for 72 hours, after which it should be removed.  Wrap patch in a tissue and discard in the trash. Wash hands thoroughly with soap and water. 2. You may remove the patch earlier than 72 hours if you experience unpleasant side effects which may include dry mouth, dizziness or visual disturbances. 3. Avoid touching the patch. Wash your hands with soap and water after contact with the patch.    About my Jackson-Pratt Bulb Drain  What is a Jackson-Pratt bulb? A Jackson-Pratt is a soft, round device used to collect drainage. It is connected to a long, thin drainage catheter, which is held in place by one or two small stiches near your surgical incision site. When the bulb is squeezed, it forms a vacuum, forcing the drainage to empty into the  bulb.  Emptying the Jackson-Pratt bulb- To empty the bulb: 1. Release the plug on the top of the bulb. 2. Pour the bulb's contents into a measuring container which your nurse will provide. 3. Record the time emptied and amount of drainage. Empty the drain(s) as often as your     doctor or nurse recommends.  Date                  Time                    Amount (Drain 1)                 Amount (Drain 2)  _____________________________________________________________________  _____________________________________________________________________  _____________________________________________________________________  _____________________________________________________________________  _____________________________________________________________________  _____________________________________________________________________  _____________________________________________________________________  _____________________________________________________________________  Squeezing the Jackson-Pratt Bulb- To squeeze the bulb: 1. Make sure the plug at the top of the bulb is open. 2. Squeeze the bulb tightly in your fist. You will hear air squeezing from the bulb. 3. Replace the plug while the bulb is squeezed. 4. Use a safety pin to attach the bulb to your clothing. This will keep the catheter from     pulling at the bulb insertion site.  When to call your doctor- Call your doctor if: Drain site becomes red, swollen or hot. You have a fever greater than 101 degrees F. There is oozing at the drain site. Drain falls out (apply a guaze bandage over the drain hole and secure it with tape). Drainage increases daily not related to activity patterns. (You will usually have more  drainage when you are active than when you are resting.) Drainage has a bad odor.      JP Drain [employer] this sheet to all of your post-operative appointments while you have your drains. Please measure your  drains by CC's or ML's. Make sure you drain and measure your JP Drains 2 or 3 times per day. At the end of each day, add up totals for the left side and add up totals for the right side.    ( 9 am )     ( 3 pm )        ( 9 pm )                Date L  R  L  R  L  R  Total L/R

## 2022-03-24 NOTE — Transfer of Care (Signed)
Immediate Anesthesia Transfer of Care Note  Patient: Autumn Wyatt  Procedure(s) Performed: TISSUE EXPANDER - REMOVAL AND REPLACEMENT (Left: Breast)  Patient Location: PACU  Anesthesia Type:General  Level of Consciousness: awake, alert  and oriented  Airway & Oxygen Therapy: Patient Spontanous Breathing and Patient connected to face mask oxygen  Post-op Assessment: Report given to RN and Post -op Vital signs reviewed and stable  Post vital signs: Reviewed and stable  Last Vitals:  Vitals Value Taken Time  BP    Temp    Pulse    Resp    SpO2      Last Pain:  Vitals:   03/24/22 1021  TempSrc: Oral  PainSc: 0-No pain      Patients Stated Pain Goal: 7 (73/57/89 7847)  Complications: No notable events documented.

## 2022-03-24 NOTE — Anesthesia Postprocedure Evaluation (Signed)
Anesthesia Post Note  Patient: Autumn Wyatt  Procedure(s) Performed: TISSUE EXPANDER - REMOVAL AND REPLACEMENT (Left: Breast)     Patient location during evaluation: PACU Anesthesia Type: General Level of consciousness: sedated and patient cooperative Pain management: pain level controlled Vital Signs Assessment: post-procedure vital signs reviewed and stable Respiratory status: spontaneous breathing Cardiovascular status: stable Anesthetic complications: no   No notable events documented.  Last Vitals:  Vitals:   03/24/22 1324 03/24/22 1400  BP: (!) 153/80 121/81  Pulse: 72 68  Resp: 16 16  Temp: 36.4 C   SpO2: 98% 97%    Last Pain:  Vitals:   03/24/22 1400  TempSrc:   PainSc: 2                  Nolon Nations

## 2022-03-24 NOTE — Op Note (Signed)
Operative Note   DATE OF OPERATION: 8.11.23  LOCATION: [facility]-outpatient  SURGICAL DIVISION: Plastic Surgery  PREOPERATIVE DIAGNOSES:  1. Left breast cancer UOQ ER+ 2. Acquired absence breast 3. Deflation left chest tissue expander  POSTOPERATIVE DIAGNOSES:  same  PROCEDURE:  Removal and replacement left chest tissue expander  SURGEON: Irene Limbo MD MBA  ASSISTANT: none  ANESTHESIA:  General.   EBL: 20 ml  COMPLICATIONS: None immediate.   INDICATIONS FOR PROCEDURE:  The patient, Autumn Wyatt, is a 56 y.o. female born on 23-Mar-1966, is here for replacement left chest tissue expander.   FINDINGS: Removed tissue expander with approximately 1 mm hole noted over lower pole. Placed Natrelle 133S FV-12-T 400 ml tissue expander, initial fill volume 325 ml saline. SN [id number]  DESCRIPTION OF PROCEDURE:  The patient's operative site was marked with the patient in the preoperative area. The patient was taken to the operating room. SCDs were placed and IV antibiotics were given. The patient's operative site was prepped and draped in a sterile fashion. A time out was performed and all information was confirmed to be correct. Incision made in left inframammary fold scar and carried through superficial fascia and acellular dermis. Expander removed with finding as above. Complete incorporation acellular dermis noted. Capsulotomy performed superiorly. Cavity irrigated with saline solution containing Ancef gentamicin and betadine. Hemostasis ensured. 80 Fr JP placed and secured with 2-0 nylon. New expander prepared and placed into cavity. Central inferior tab secured to chest wall with 2-0 PDS. Closure completed with 2-0 PDS to approximate acellular dermis, 3-0 monocryl used to close superficial fascia and dermis. Skin closure completed with 4-0 monocryl subcuticular. Dermabond applied. Port identified and accessed. Expander filled to 325 ml saline. Dry dressing and breast binder  applied.   The patient was allowed to wake from anesthesia, extubated and taken to the recovery room in satisfactory condition.   SPECIMENS: none  DRAINS: 63 Fr JP in left reconstruction  Irene Limbo, MD [facility]  Office/ physician access line after hours [phone]

## 2022-03-27 ENCOUNTER — Encounter: Payer: Self-pay | Admitting: *Deleted

## 2022-03-27 ENCOUNTER — Encounter (HOSPITAL_BASED_OUTPATIENT_CLINIC_OR_DEPARTMENT_OTHER): Payer: Self-pay | Admitting: Plastic Surgery

## 2022-03-27 DIAGNOSIS — Z170 Estrogen receptor positive status [ER+]: Secondary | ICD-10-CM

## 2022-03-28 ENCOUNTER — Ambulatory Visit: Payer: 59 | Admitting: Rehabilitation

## 2022-03-28 ENCOUNTER — Encounter: Payer: Self-pay | Admitting: Rehabilitation

## 2022-04-03 ENCOUNTER — Encounter: Payer: Self-pay | Admitting: *Deleted

## 2022-04-24 ENCOUNTER — Other Ambulatory Visit: Payer: Self-pay

## 2022-04-24 ENCOUNTER — Ambulatory Visit
Admission: RE | Admit: 2022-04-24 | Discharge: 2022-04-24 | Disposition: A | Discharge status: Home / Self Care | Payer: 59 | Source: Ambulatory Visit | Attending: Radiation Oncology | Admitting: Radiation Oncology

## 2022-04-24 DIAGNOSIS — C50412 Malignant neoplasm of upper-outer quadrant of left female breast: Secondary | ICD-10-CM | POA: Insufficient documentation

## 2022-04-24 DIAGNOSIS — Z510 Encounter for antineoplastic radiation therapy: Secondary | ICD-10-CM | POA: Diagnosis not present

## 2022-05-01 ENCOUNTER — Ambulatory Visit
Admission: RE | Admit: 2022-05-01 | Discharge: 2022-05-01 | Disposition: A | Discharge status: Home / Self Care | Payer: 59 | Source: Ambulatory Visit | Attending: Radiation Oncology | Admitting: Radiation Oncology

## 2022-05-01 ENCOUNTER — Encounter: Payer: Self-pay | Admitting: *Deleted

## 2022-05-01 ENCOUNTER — Other Ambulatory Visit: Payer: Self-pay

## 2022-05-01 ENCOUNTER — Ambulatory Visit: Payer: 59 | Admitting: Radiation Oncology

## 2022-05-01 DIAGNOSIS — Z510 Encounter for antineoplastic radiation therapy: Secondary | ICD-10-CM | POA: Diagnosis not present

## 2022-05-01 LAB — RAD ONC ARIA SESSION SUMMARY

## 2022-05-02 ENCOUNTER — Ambulatory Visit
Admission: RE | Admit: 2022-05-02 | Discharge: 2022-05-02 | Disposition: A | Discharge status: Home / Self Care | Payer: 59 | Source: Ambulatory Visit | Attending: Radiation Oncology | Admitting: Radiation Oncology

## 2022-05-02 ENCOUNTER — Other Ambulatory Visit: Payer: Self-pay

## 2022-05-02 ENCOUNTER — Ambulatory Visit: Payer: 59

## 2022-05-02 DIAGNOSIS — Z510 Encounter for antineoplastic radiation therapy: Secondary | ICD-10-CM | POA: Diagnosis not present

## 2022-05-02 LAB — RAD ONC ARIA SESSION SUMMARY
Course Elapsed Days: 1
Plan Fractions Treated to Date: 1
Plan Fractions Treated to Date: 2
Plan Prescribed Dose Per Fraction: 1.8 Gy
Plan Prescribed Dose Per Fraction: 1.8 Gy
Plan Total Fractions Prescribed: 14
Plan Total Fractions Prescribed: 28
Plan Total Prescribed Dose: 25.2 Gy
Plan Total Prescribed Dose: 50.4 Gy
Reference Point Dosage Given to Date: 3.6 Gy
Reference Point Dosage Given to Date: 3.6 Gy
Reference Point Session Dosage Given: 1.8 Gy
Reference Point Session Dosage Given: 1.8 Gy
Session Number: 2

## 2022-05-03 ENCOUNTER — Ambulatory Visit: Payer: 59

## 2022-05-04 ENCOUNTER — Other Ambulatory Visit: Payer: Self-pay

## 2022-05-04 ENCOUNTER — Ambulatory Visit
Admission: RE | Admit: 2022-05-04 | Discharge: 2022-05-04 | Disposition: A | Discharge status: Home / Self Care | Payer: 59 | Source: Ambulatory Visit | Attending: Radiation Oncology | Admitting: Radiation Oncology

## 2022-05-04 ENCOUNTER — Ambulatory Visit: Payer: 59

## 2022-05-04 DIAGNOSIS — Z510 Encounter for antineoplastic radiation therapy: Secondary | ICD-10-CM | POA: Diagnosis not present

## 2022-05-04 LAB — RAD ONC ARIA SESSION SUMMARY
Course Elapsed Days: 3
Plan Fractions Treated to Date: 2
Plan Fractions Treated to Date: 3
Plan Prescribed Dose Per Fraction: 1.8 Gy
Plan Prescribed Dose Per Fraction: 1.8 Gy
Plan Total Fractions Prescribed: 14
Plan Total Fractions Prescribed: 28
Plan Total Prescribed Dose: 25.2 Gy
Plan Total Prescribed Dose: 50.4 Gy
Reference Point Dosage Given to Date: 5.4 Gy
Reference Point Dosage Given to Date: 5.4 Gy
Reference Point Session Dosage Given: 1.8 Gy
Reference Point Session Dosage Given: 1.8 Gy
Session Number: 3

## 2022-05-04 NOTE — Progress Notes (Signed)
Pt here for patient teaching.  Pt given Radiation and You booklet, skin care instructions, alra deodorant and Radiaplex.    Reviewed areas of pertinence such as fatigue, hair loss, skin changes, breast tenderness, and breast swelling. Pt able to give teach back of to pat skin and use unscented/gentle soap,apply Radiaplex bid, avoid applying anything to skin within 4 hours of treatment, avoid wearing an under wire bra, and to use an electric razor if they must shave. Pt verbalizes understanding of information given and will contact nursing with any questions or concerns.    Autumn Wyatt M. Bharat Antillon RN, BSN  

## 2022-05-05 ENCOUNTER — Ambulatory Visit: Payer: 59

## 2022-05-05 ENCOUNTER — Ambulatory Visit
Admission: RE | Admit: 2022-05-05 | Discharge: 2022-05-05 | Disposition: A | Discharge status: Home / Self Care | Payer: 59 | Source: Ambulatory Visit | Attending: Radiation Oncology | Admitting: Radiation Oncology

## 2022-05-05 ENCOUNTER — Other Ambulatory Visit: Payer: Self-pay

## 2022-05-05 ENCOUNTER — Ambulatory Visit: Admission: RE | Admit: 2022-05-05 | Payer: 59 | Source: Ambulatory Visit

## 2022-05-05 DIAGNOSIS — Z170 Estrogen receptor positive status [ER+]: Secondary | ICD-10-CM

## 2022-05-05 DIAGNOSIS — Z510 Encounter for antineoplastic radiation therapy: Secondary | ICD-10-CM | POA: Diagnosis not present

## 2022-05-05 LAB — RAD ONC ARIA SESSION SUMMARY
Course Elapsed Days: 4
Plan Fractions Treated to Date: 2
Plan Fractions Treated to Date: 4
Plan Prescribed Dose Per Fraction: 1.8 Gy
Plan Prescribed Dose Per Fraction: 1.8 Gy
Plan Total Fractions Prescribed: 14
Plan Total Fractions Prescribed: 28
Plan Total Prescribed Dose: 25.2 Gy
Plan Total Prescribed Dose: 50.4 Gy
Reference Point Dosage Given to Date: 7.2 Gy
Reference Point Dosage Given to Date: 7.2 Gy
Reference Point Session Dosage Given: 1.8 Gy
Reference Point Session Dosage Given: 1.8 Gy
Session Number: 4

## 2022-05-05 MED ORDER — RADIAPLEXRX EX GEL: Freq: Once | CUTANEOUS | Status: AC

## 2022-05-05 MED ORDER — ALRA NON-METALLIC DEODORANT (RAD-ONC)
1.0000 | Freq: Once | TOPICAL | Status: AC
  Administered 2022-05-05: 1 via TOPICAL

## 2022-05-08 ENCOUNTER — Ambulatory Visit
Admission: RE | Admit: 2022-05-08 | Discharge: 2022-05-08 | Disposition: A | Discharge status: Home / Self Care | Payer: 59 | Source: Ambulatory Visit | Attending: Radiation Oncology | Admitting: Radiation Oncology

## 2022-05-08 ENCOUNTER — Other Ambulatory Visit: Payer: Self-pay

## 2022-05-08 ENCOUNTER — Ambulatory Visit: Payer: 59

## 2022-05-08 DIAGNOSIS — Z510 Encounter for antineoplastic radiation therapy: Secondary | ICD-10-CM | POA: Diagnosis not present

## 2022-05-08 LAB — RAD ONC ARIA SESSION SUMMARY
Course Elapsed Days: 7
Plan Fractions Treated to Date: 3
Plan Fractions Treated to Date: 5
Plan Prescribed Dose Per Fraction: 1.8 Gy
Plan Prescribed Dose Per Fraction: 1.8 Gy
Plan Total Fractions Prescribed: 14
Plan Total Fractions Prescribed: 28
Plan Total Prescribed Dose: 25.2 Gy
Plan Total Prescribed Dose: 50.4 Gy
Reference Point Dosage Given to Date: 9 Gy
Reference Point Dosage Given to Date: 9 Gy
Reference Point Session Dosage Given: 1.8 Gy
Reference Point Session Dosage Given: 1.8 Gy
Session Number: 5

## 2022-05-09 ENCOUNTER — Other Ambulatory Visit: Payer: Self-pay

## 2022-05-09 ENCOUNTER — Ambulatory Visit: Payer: 59

## 2022-05-09 ENCOUNTER — Ambulatory Visit
Admission: RE | Admit: 2022-05-09 | Discharge: 2022-05-09 | Disposition: A | Discharge status: Home / Self Care | Payer: 59 | Source: Ambulatory Visit | Attending: Radiation Oncology | Admitting: Radiation Oncology

## 2022-05-09 DIAGNOSIS — Z510 Encounter for antineoplastic radiation therapy: Secondary | ICD-10-CM | POA: Diagnosis not present

## 2022-05-09 LAB — RAD ONC ARIA SESSION SUMMARY
Course Elapsed Days: 8
Plan Fractions Treated to Date: 3
Plan Fractions Treated to Date: 6
Plan Prescribed Dose Per Fraction: 1.8 Gy
Plan Prescribed Dose Per Fraction: 1.8 Gy
Plan Total Fractions Prescribed: 14
Plan Total Fractions Prescribed: 28
Plan Total Prescribed Dose: 25.2 Gy
Plan Total Prescribed Dose: 50.4 Gy
Reference Point Dosage Given to Date: 10.8 Gy
Reference Point Dosage Given to Date: 10.8 Gy
Reference Point Session Dosage Given: 1.8 Gy
Reference Point Session Dosage Given: 1.8 Gy
Session Number: 6

## 2022-05-10 ENCOUNTER — Ambulatory Visit: Payer: 59

## 2022-05-10 ENCOUNTER — Ambulatory Visit: Payer: 59 | Admitting: Radiation Oncology

## 2022-05-10 ENCOUNTER — Other Ambulatory Visit: Payer: Self-pay

## 2022-05-10 ENCOUNTER — Ambulatory Visit
Admission: RE | Admit: 2022-05-10 | Discharge: 2022-05-10 | Disposition: A | Discharge status: Home / Self Care | Payer: 59 | Source: Ambulatory Visit | Attending: Radiation Oncology | Admitting: Radiation Oncology

## 2022-05-10 DIAGNOSIS — Z510 Encounter for antineoplastic radiation therapy: Secondary | ICD-10-CM | POA: Diagnosis not present

## 2022-05-10 LAB — RAD ONC ARIA SESSION SUMMARY
Course Elapsed Days: 9
Plan Fractions Treated to Date: 4
Plan Fractions Treated to Date: 7
Plan Prescribed Dose Per Fraction: 1.8 Gy
Plan Prescribed Dose Per Fraction: 1.8 Gy
Plan Total Fractions Prescribed: 14
Plan Total Fractions Prescribed: 28
Plan Total Prescribed Dose: 25.2 Gy
Plan Total Prescribed Dose: 50.4 Gy
Reference Point Dosage Given to Date: 12.6 Gy
Reference Point Dosage Given to Date: 12.6 Gy
Reference Point Session Dosage Given: 1.8 Gy
Reference Point Session Dosage Given: 1.8 Gy
Session Number: 7

## 2022-05-11 ENCOUNTER — Other Ambulatory Visit: Payer: Self-pay

## 2022-05-11 ENCOUNTER — Ambulatory Visit
Admission: RE | Admit: 2022-05-11 | Discharge: 2022-05-11 | Disposition: A | Discharge status: Home / Self Care | Payer: 59 | Source: Ambulatory Visit | Attending: Radiation Oncology | Admitting: Radiation Oncology

## 2022-05-11 ENCOUNTER — Ambulatory Visit: Payer: 59

## 2022-05-11 DIAGNOSIS — Z510 Encounter for antineoplastic radiation therapy: Secondary | ICD-10-CM | POA: Diagnosis not present

## 2022-05-11 LAB — RAD ONC ARIA SESSION SUMMARY
Course Elapsed Days: 10
Plan Fractions Treated to Date: 4
Plan Fractions Treated to Date: 8
Plan Prescribed Dose Per Fraction: 1.8 Gy
Plan Prescribed Dose Per Fraction: 1.8 Gy
Plan Total Fractions Prescribed: 14
Plan Total Fractions Prescribed: 28
Plan Total Prescribed Dose: 25.2 Gy
Plan Total Prescribed Dose: 50.4 Gy
Reference Point Dosage Given to Date: 14.4 Gy
Reference Point Dosage Given to Date: 14.4 Gy
Reference Point Session Dosage Given: 1.8 Gy
Reference Point Session Dosage Given: 1.8 Gy
Session Number: 8

## 2022-05-12 ENCOUNTER — Ambulatory Visit
Admission: RE | Admit: 2022-05-12 | Discharge: 2022-05-12 | Disposition: A | Discharge status: Home / Self Care | Payer: 59 | Source: Ambulatory Visit | Attending: Radiation Oncology | Admitting: Radiation Oncology

## 2022-05-12 ENCOUNTER — Ambulatory Visit: Payer: 59

## 2022-05-12 ENCOUNTER — Other Ambulatory Visit: Payer: Self-pay

## 2022-05-12 DIAGNOSIS — Z510 Encounter for antineoplastic radiation therapy: Secondary | ICD-10-CM | POA: Diagnosis not present

## 2022-05-12 LAB — RAD ONC ARIA SESSION SUMMARY
Course Elapsed Days: 11
Plan Fractions Treated to Date: 5
Plan Fractions Treated to Date: 9
Plan Prescribed Dose Per Fraction: 1.8 Gy
Plan Prescribed Dose Per Fraction: 1.8 Gy
Plan Total Fractions Prescribed: 14
Plan Total Fractions Prescribed: 28
Plan Total Prescribed Dose: 25.2 Gy
Plan Total Prescribed Dose: 50.4 Gy
Reference Point Dosage Given to Date: 16.2 Gy
Reference Point Dosage Given to Date: 16.2 Gy
Reference Point Session Dosage Given: 1.8 Gy
Reference Point Session Dosage Given: 1.8 Gy
Session Number: 9

## 2022-05-15 ENCOUNTER — Ambulatory Visit
Admission: RE | Admit: 2022-05-15 | Discharge: 2022-05-15 | Disposition: A | Discharge status: Home / Self Care | Payer: 59 | Source: Ambulatory Visit | Attending: Radiation Oncology | Admitting: Radiation Oncology

## 2022-05-15 ENCOUNTER — Ambulatory Visit: Payer: 59

## 2022-05-15 ENCOUNTER — Other Ambulatory Visit: Payer: Self-pay

## 2022-05-15 DIAGNOSIS — Z170 Estrogen receptor positive status [ER+]: Secondary | ICD-10-CM | POA: Diagnosis present

## 2022-05-15 DIAGNOSIS — C50412 Malignant neoplasm of upper-outer quadrant of left female breast: Secondary | ICD-10-CM | POA: Insufficient documentation

## 2022-05-15 LAB — RAD ONC ARIA SESSION SUMMARY
Course Elapsed Days: 14
Plan Fractions Treated to Date: 10
Plan Fractions Treated to Date: 5
Plan Prescribed Dose Per Fraction: 1.8 Gy
Plan Prescribed Dose Per Fraction: 1.8 Gy
Plan Total Fractions Prescribed: 14
Plan Total Fractions Prescribed: 28
Plan Total Prescribed Dose: 25.2 Gy
Plan Total Prescribed Dose: 50.4 Gy
Reference Point Dosage Given to Date: 18 Gy
Reference Point Dosage Given to Date: 18 Gy
Reference Point Session Dosage Given: 1.8 Gy
Reference Point Session Dosage Given: 1.8 Gy
Session Number: 10

## 2022-05-16 ENCOUNTER — Ambulatory Visit: Payer: 59

## 2022-05-16 ENCOUNTER — Other Ambulatory Visit: Payer: Self-pay

## 2022-05-16 ENCOUNTER — Ambulatory Visit
Admission: RE | Admit: 2022-05-16 | Discharge: 2022-05-16 | Disposition: A | Discharge status: Home / Self Care | Payer: 59 | Source: Ambulatory Visit | Attending: Radiation Oncology | Admitting: Radiation Oncology

## 2022-05-16 DIAGNOSIS — C50412 Malignant neoplasm of upper-outer quadrant of left female breast: Secondary | ICD-10-CM | POA: Diagnosis not present

## 2022-05-16 LAB — RAD ONC ARIA SESSION SUMMARY
Course Elapsed Days: 15
Plan Fractions Treated to Date: 11
Plan Fractions Treated to Date: 6
Plan Prescribed Dose Per Fraction: 1.8 Gy
Plan Prescribed Dose Per Fraction: 1.8 Gy
Plan Total Fractions Prescribed: 14
Plan Total Fractions Prescribed: 28
Plan Total Prescribed Dose: 25.2 Gy
Plan Total Prescribed Dose: 50.4 Gy
Reference Point Dosage Given to Date: 19.8 Gy
Reference Point Dosage Given to Date: 19.8 Gy
Reference Point Session Dosage Given: 1.8 Gy
Reference Point Session Dosage Given: 1.8 Gy
Session Number: 11

## 2022-05-17 ENCOUNTER — Ambulatory Visit
Admission: RE | Admit: 2022-05-17 | Discharge: 2022-05-17 | Disposition: A | Discharge status: Home / Self Care | Payer: 59 | Source: Ambulatory Visit | Attending: Radiation Oncology | Admitting: Radiation Oncology

## 2022-05-17 ENCOUNTER — Ambulatory Visit: Payer: 59

## 2022-05-17 ENCOUNTER — Other Ambulatory Visit: Payer: Self-pay

## 2022-05-17 DIAGNOSIS — C50412 Malignant neoplasm of upper-outer quadrant of left female breast: Secondary | ICD-10-CM | POA: Diagnosis not present

## 2022-05-17 LAB — RAD ONC ARIA SESSION SUMMARY
Course Elapsed Days: 16
Plan Fractions Treated to Date: 12
Plan Fractions Treated to Date: 6
Plan Prescribed Dose Per Fraction: 1.8 Gy
Plan Prescribed Dose Per Fraction: 1.8 Gy
Plan Total Fractions Prescribed: 14
Plan Total Fractions Prescribed: 28
Plan Total Prescribed Dose: 25.2 Gy
Plan Total Prescribed Dose: 50.4 Gy
Reference Point Dosage Given to Date: 21.6 Gy
Reference Point Dosage Given to Date: 21.6 Gy
Reference Point Session Dosage Given: 1.8 Gy
Reference Point Session Dosage Given: 1.8 Gy
Session Number: 12

## 2022-05-18 ENCOUNTER — Ambulatory Visit: Payer: 59

## 2022-05-18 ENCOUNTER — Other Ambulatory Visit: Payer: Self-pay

## 2022-05-18 ENCOUNTER — Ambulatory Visit
Admission: RE | Admit: 2022-05-18 | Discharge: 2022-05-18 | Disposition: A | Discharge status: Home / Self Care | Payer: 59 | Source: Ambulatory Visit | Attending: Radiation Oncology | Admitting: Radiation Oncology

## 2022-05-18 DIAGNOSIS — C50412 Malignant neoplasm of upper-outer quadrant of left female breast: Secondary | ICD-10-CM | POA: Diagnosis not present

## 2022-05-18 LAB — RAD ONC ARIA SESSION SUMMARY
Course Elapsed Days: 17
Plan Fractions Treated to Date: 13
Plan Fractions Treated to Date: 7
Plan Prescribed Dose Per Fraction: 1.8 Gy
Plan Prescribed Dose Per Fraction: 1.8 Gy
Plan Total Fractions Prescribed: 14
Plan Total Fractions Prescribed: 28
Plan Total Prescribed Dose: 25.2 Gy
Plan Total Prescribed Dose: 50.4 Gy
Reference Point Dosage Given to Date: 23.4 Gy
Reference Point Dosage Given to Date: 23.4 Gy
Reference Point Session Dosage Given: 1.8 Gy
Reference Point Session Dosage Given: 1.8 Gy
Session Number: 13

## 2022-05-19 ENCOUNTER — Ambulatory Visit: Payer: 59

## 2022-05-19 ENCOUNTER — Ambulatory Visit
Admission: RE | Admit: 2022-05-19 | Discharge: 2022-05-19 | Disposition: A | Discharge status: Home / Self Care | Payer: 59 | Source: Ambulatory Visit | Attending: Radiation Oncology | Admitting: Radiation Oncology

## 2022-05-19 ENCOUNTER — Ambulatory Visit: Payer: 59 | Admitting: Radiation Oncology

## 2022-05-19 ENCOUNTER — Other Ambulatory Visit: Payer: Self-pay

## 2022-05-19 DIAGNOSIS — Z170 Estrogen receptor positive status [ER+]: Secondary | ICD-10-CM

## 2022-05-19 DIAGNOSIS — C50412 Malignant neoplasm of upper-outer quadrant of left female breast: Secondary | ICD-10-CM | POA: Diagnosis not present

## 2022-05-19 LAB — RAD ONC ARIA SESSION SUMMARY
Course Elapsed Days: 18
Plan Fractions Treated to Date: 14
Plan Fractions Treated to Date: 7
Plan Prescribed Dose Per Fraction: 1.8 Gy
Plan Prescribed Dose Per Fraction: 1.8 Gy
Plan Total Fractions Prescribed: 14
Plan Total Fractions Prescribed: 28
Plan Total Prescribed Dose: 25.2 Gy
Plan Total Prescribed Dose: 50.4 Gy
Reference Point Dosage Given to Date: 25.2 Gy
Reference Point Dosage Given to Date: 25.2 Gy
Reference Point Session Dosage Given: 1.8 Gy
Reference Point Session Dosage Given: 1.8 Gy
Session Number: 14

## 2022-05-19 MED ORDER — RADIAPLEXRX EX GEL: Freq: Once | CUTANEOUS | Status: AC

## 2022-05-22 ENCOUNTER — Other Ambulatory Visit: Payer: Self-pay

## 2022-05-22 ENCOUNTER — Ambulatory Visit: Payer: 59

## 2022-05-22 ENCOUNTER — Ambulatory Visit: Payer: 59 | Attending: Surgery

## 2022-05-22 ENCOUNTER — Ambulatory Visit
Admission: RE | Admit: 2022-05-22 | Discharge: 2022-05-22 | Disposition: A | Discharge status: Home / Self Care | Payer: 59 | Source: Ambulatory Visit | Attending: Radiation Oncology | Admitting: Radiation Oncology

## 2022-05-22 VITALS — Wt 148.5 lb

## 2022-05-22 DIAGNOSIS — C50412 Malignant neoplasm of upper-outer quadrant of left female breast: Secondary | ICD-10-CM | POA: Diagnosis not present

## 2022-05-22 DIAGNOSIS — Z483 Aftercare following surgery for neoplasm: Secondary | ICD-10-CM | POA: Insufficient documentation

## 2022-05-22 LAB — RAD ONC ARIA SESSION SUMMARY
Course Elapsed Days: 21
Plan Fractions Treated to Date: 15
Plan Fractions Treated to Date: 8
Plan Prescribed Dose Per Fraction: 1.8 Gy
Plan Prescribed Dose Per Fraction: 1.8 Gy
Plan Total Fractions Prescribed: 14
Plan Total Fractions Prescribed: 28
Plan Total Prescribed Dose: 25.2 Gy
Plan Total Prescribed Dose: 50.4 Gy
Reference Point Dosage Given to Date: 27 Gy
Reference Point Dosage Given to Date: 27 Gy
Reference Point Session Dosage Given: 1.8 Gy
Reference Point Session Dosage Given: 1.8 Gy
Session Number: 15

## 2022-05-22 NOTE — Therapy (Addendum)
OUTPATIENT PHYSICAL THERAPY SOZO SCREENING NOTE   Patient Name: Autumn Wyatt MRN: [id number] DOB:[birth year], 56 y.o., female Today's Date: 05/22/2022  PCP: Delsa Bern, MD REFERRING PROVIDER: Coralie Keens, MD   PT End of Session - 05/22/22 0847     Visit Number 2   # unchanged due to screen only   PT Start Time 0845    PT Stop Time 0852    PT Time Calculation (min) 7 min    Activity Tolerance Patient tolerated treatment well    Behavior During Therapy [facility] for tasks assessed/performed             Past Medical History:  Diagnosis Date   Abnormal pap    Allergy    Breast cancer ([place]) 12/14/2021   left breast ILC, LCIS   DIAPHORESIS 12/27/2009   Qualifier: Diagnosis of  By: Burnett Kanaris     Hypertension    Palpitations 12/27/2009   no current problems, Qualifier: Diagnosis of  By: Burnett Kanaris   SUPRAVENTRICULAR TACHYCARDIA 05/21/2020   had ablation - Qualifier: Diagnosis of  By: Burnett Kanaris   Past Surgical History:  Procedure Laterality Date   BREAST BIOPSY Left 12/14/2021   BREAST RECONSTRUCTION WITH PLACEMENT OF TISSUE EXPANDER AND ALLODERM Left 01/31/2022   Procedure: BREAST RECONSTRUCTION WITH PLACEMENT OF TISSUE EXPANDER AND ALLODERM;  Surgeon: Irene Limbo, MD;  Location: [place];  Service: Plastics;  Laterality: Left;   CARPAL TUNNEL WITH CUBITAL TUNNEL Bilateral    EYE SURGERY  2006   INCISION AND DRAINAGE OF WOUND Left 02/18/2022   Procedure: IRRIGATION AND DEBRIDEMENT WOUND-LEFT BREAST;  Surgeon: Irene Limbo, MD;  Location: [place];  Service: Plastics;  Laterality: Left;   KNEE SURGERY     MASTECTOMY W/ SENTINEL NODE BIOPSY Left 01/31/2022   Procedure: LEFT NIPPLE SPARING MASTECTOMY AND SENTINEL LYMPH NODE BIOPSY;  Surgeon: Coralie Keens, MD;  Location: [place];  Service: General;  Laterality: Left;   REFRACTIVE SURGERY     SVT ABLATION N/A 05/21/2020   Procedure: SVT ABLATION;   Surgeon: Vickie Epley, MD;  Location: [place] [facility];  Service: Cardiovascular;  Laterality: N/A;   TISSUE EXPANDER PLACEMENT Left 03/24/2022   Procedure: TISSUE EXPANDER - REMOVAL AND REPLACEMENT;  Surgeon: Irene Limbo, MD;  Location: [place];  Service: Plastics;  Laterality: Left;   WISDOM TOOTH EXTRACTION     Patient Active Problem List   Diagnosis Date Noted   Breast cancer, left breast ([place]) 01/31/2022   Malignant neoplasm of upper-outer quadrant of left breast in female, estrogen receptor positive ([place]) 12/26/2021   SVT (supraventricular tachycardia) ([place]) 12/27/2009   DIAPHORESIS 12/27/2009   PALPITATIONS 12/27/2009   SHORTNESS OF BREATH 12/27/2009    REFERRING DIAG: left breast cancer at risk for lymphedema  THERAPY DIAG: Aftercare following surgery for neoplasm  PERTINENT HISTORY: Pt had a left nipple sparing mastectomy with expander placement on 02/08/22 with 1 negative LN removed. 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 Radiation is recommended with pt deciding. 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 It measures 1.1 cm and is located in the upper outer  quadrant. It is ER+, PR+ and HER 2- with a Ki67 of 1%. She has a family history of breast cancer in her paternal aunt who died in her 78s from breast cancer.She has a history of SVT and had a heart ablation many years ago and has had no cardiac issues  PRECAUTIONS:  left UE Lymphedema risk, None  SUBJECTIVE: Pt returns for her first 3 month L-Dex screen.   PAIN:  Are you having pain? No  SOZO SCREENING: Patient was assessed today using the SOZO machine to determine the lymphedema index score. This was compared to her baseline score. It was determined that she is within the recommended range when compared to her baseline and no further action is needed at this time. She will continue SOZO screenings. These are done every 3 months for 2  years post operatively followed by every 6 months for 2 years, and then annually.   L-DEX FLOWSHEETS - 05/22/22 0800       L-DEX LYMPHEDEMA SCREENING   Measurement Type Unilateral    L-DEX MEASUREMENT EXTREMITY Upper Extremity    POSITION  Standing    DOMINANT SIDE Right    At Risk Side Left    BASELINE SCORE (UNILATERAL) 0.1    L-DEX SCORE (UNILATERAL) 1.5    VALUE CHANGE (UNILAT) 1.4              Autumn Wyatt, PTA 05/22/2022, 8:54 AM

## 2022-05-23 ENCOUNTER — Ambulatory Visit
Admission: RE | Admit: 2022-05-23 | Discharge: 2022-05-23 | Disposition: A | Discharge status: Home / Self Care | Payer: 59 | Source: Ambulatory Visit | Attending: Radiation Oncology | Admitting: Radiation Oncology

## 2022-05-23 ENCOUNTER — Ambulatory Visit: Payer: 59

## 2022-05-23 ENCOUNTER — Other Ambulatory Visit: Payer: Self-pay

## 2022-05-23 DIAGNOSIS — C50412 Malignant neoplasm of upper-outer quadrant of left female breast: Secondary | ICD-10-CM | POA: Diagnosis not present

## 2022-05-23 LAB — RAD ONC ARIA SESSION SUMMARY
Course Elapsed Days: 22
Plan Fractions Treated to Date: 16
Plan Fractions Treated to Date: 8
Plan Prescribed Dose Per Fraction: 1.8 Gy
Plan Prescribed Dose Per Fraction: 1.8 Gy
Plan Total Fractions Prescribed: 14
Plan Total Fractions Prescribed: 28
Plan Total Prescribed Dose: 25.2 Gy
Plan Total Prescribed Dose: 50.4 Gy
Reference Point Dosage Given to Date: 28.8 Gy
Reference Point Dosage Given to Date: 28.8 Gy
Reference Point Session Dosage Given: 1.8 Gy
Reference Point Session Dosage Given: 1.8 Gy
Session Number: 16

## 2022-05-24 ENCOUNTER — Ambulatory Visit
Admission: RE | Admit: 2022-05-24 | Discharge: 2022-05-24 | Disposition: A | Discharge status: Home / Self Care | Payer: 59 | Source: Ambulatory Visit | Attending: Radiation Oncology | Admitting: Radiation Oncology

## 2022-05-24 ENCOUNTER — Ambulatory Visit: Payer: 59

## 2022-05-24 ENCOUNTER — Other Ambulatory Visit: Payer: Self-pay

## 2022-05-24 DIAGNOSIS — C50412 Malignant neoplasm of upper-outer quadrant of left female breast: Secondary | ICD-10-CM | POA: Diagnosis not present

## 2022-05-24 LAB — RAD ONC ARIA SESSION SUMMARY
Course Elapsed Days: 23
Plan Fractions Treated to Date: 17
Plan Fractions Treated to Date: 9
Plan Prescribed Dose Per Fraction: 1.8 Gy
Plan Prescribed Dose Per Fraction: 1.8 Gy
Plan Total Fractions Prescribed: 14
Plan Total Fractions Prescribed: 28
Plan Total Prescribed Dose: 25.2 Gy
Plan Total Prescribed Dose: 50.4 Gy
Reference Point Dosage Given to Date: 30.6 Gy
Reference Point Dosage Given to Date: 30.6 Gy
Reference Point Session Dosage Given: 1.8 Gy
Reference Point Session Dosage Given: 1.8 Gy
Session Number: 17

## 2022-05-25 ENCOUNTER — Ambulatory Visit: Payer: 59

## 2022-05-25 ENCOUNTER — Ambulatory Visit
Admission: RE | Admit: 2022-05-25 | Discharge: 2022-05-25 | Disposition: A | Discharge status: Home / Self Care | Payer: 59 | Source: Ambulatory Visit | Attending: Radiation Oncology | Admitting: Radiation Oncology

## 2022-05-25 ENCOUNTER — Other Ambulatory Visit: Payer: Self-pay

## 2022-05-25 DIAGNOSIS — C50412 Malignant neoplasm of upper-outer quadrant of left female breast: Secondary | ICD-10-CM | POA: Diagnosis not present

## 2022-05-25 LAB — RAD ONC ARIA SESSION SUMMARY
Course Elapsed Days: 24
Plan Fractions Treated to Date: 18
Plan Fractions Treated to Date: 9
Plan Prescribed Dose Per Fraction: 1.8 Gy
Plan Prescribed Dose Per Fraction: 1.8 Gy
Plan Total Fractions Prescribed: 14
Plan Total Fractions Prescribed: 28
Plan Total Prescribed Dose: 25.2 Gy
Plan Total Prescribed Dose: 50.4 Gy
Reference Point Dosage Given to Date: 32.4 Gy
Reference Point Dosage Given to Date: 32.4 Gy
Reference Point Session Dosage Given: 1.8 Gy
Reference Point Session Dosage Given: 1.8 Gy
Session Number: 18

## 2022-05-25 MED ORDER — SONAFINE EX EMUL
1.0000 | Freq: Two times a day (BID) | CUTANEOUS | Status: DC
  Administered 2022-05-25: 1 via TOPICAL

## 2022-05-26 ENCOUNTER — Ambulatory Visit: Payer: 59

## 2022-05-26 ENCOUNTER — Ambulatory Visit
Admission: RE | Admit: 2022-05-26 | Discharge: 2022-05-26 | Disposition: A | Discharge status: Home / Self Care | Payer: 59 | Source: Ambulatory Visit | Attending: Radiation Oncology | Admitting: Radiation Oncology

## 2022-05-26 ENCOUNTER — Other Ambulatory Visit: Payer: Self-pay

## 2022-05-26 DIAGNOSIS — C50412 Malignant neoplasm of upper-outer quadrant of left female breast: Secondary | ICD-10-CM | POA: Diagnosis not present

## 2022-05-26 LAB — RAD ONC ARIA SESSION SUMMARY
Course Elapsed Days: 25
Plan Fractions Treated to Date: 1
Plan Fractions Treated to Date: 19
Plan Prescribed Dose Per Fraction: 1.8 Gy
Plan Prescribed Dose Per Fraction: 1.8 Gy
Plan Total Fractions Prescribed: 10
Plan Total Fractions Prescribed: 28
Plan Total Prescribed Dose: 18 Gy
Plan Total Prescribed Dose: 50.4 Gy
Reference Point Dosage Given to Date: 34.2 Gy
Reference Point Dosage Given to Date: 34.2 Gy
Reference Point Session Dosage Given: 1.8 Gy
Reference Point Session Dosage Given: 1.8 Gy
Session Number: 19

## 2022-05-29 ENCOUNTER — Other Ambulatory Visit: Payer: Self-pay

## 2022-05-29 ENCOUNTER — Ambulatory Visit
Admission: RE | Admit: 2022-05-29 | Discharge: 2022-05-29 | Disposition: A | Discharge status: Home / Self Care | Payer: 59 | Source: Ambulatory Visit | Attending: Radiation Oncology | Admitting: Radiation Oncology

## 2022-05-29 ENCOUNTER — Ambulatory Visit: Payer: 59

## 2022-05-29 DIAGNOSIS — C50412 Malignant neoplasm of upper-outer quadrant of left female breast: Secondary | ICD-10-CM | POA: Diagnosis not present

## 2022-05-29 LAB — RAD ONC ARIA SESSION SUMMARY
Course Elapsed Days: 28
Plan Fractions Treated to Date: 2
Plan Fractions Treated to Date: 20
Plan Prescribed Dose Per Fraction: 1.8 Gy
Plan Prescribed Dose Per Fraction: 1.8 Gy
Plan Total Fractions Prescribed: 10
Plan Total Fractions Prescribed: 28
Plan Total Prescribed Dose: 18 Gy
Plan Total Prescribed Dose: 50.4 Gy
Reference Point Dosage Given to Date: 36 Gy
Reference Point Dosage Given to Date: 36 Gy
Reference Point Session Dosage Given: 1.8 Gy
Reference Point Session Dosage Given: 1.8 Gy
Session Number: 20

## 2022-05-30 ENCOUNTER — Ambulatory Visit
Admission: RE | Admit: 2022-05-30 | Discharge: 2022-05-30 | Disposition: A | Discharge status: Home / Self Care | Payer: 59 | Source: Ambulatory Visit | Attending: Radiation Oncology | Admitting: Radiation Oncology

## 2022-05-30 ENCOUNTER — Other Ambulatory Visit: Payer: Self-pay

## 2022-05-30 ENCOUNTER — Ambulatory Visit: Payer: 59

## 2022-05-30 DIAGNOSIS — C50412 Malignant neoplasm of upper-outer quadrant of left female breast: Secondary | ICD-10-CM | POA: Diagnosis not present

## 2022-05-30 LAB — RAD ONC ARIA SESSION SUMMARY
Course Elapsed Days: 29
Plan Fractions Treated to Date: 21
Plan Fractions Treated to Date: 3
Plan Prescribed Dose Per Fraction: 1.8 Gy
Plan Prescribed Dose Per Fraction: 1.8 Gy
Plan Total Fractions Prescribed: 10
Plan Total Fractions Prescribed: 28
Plan Total Prescribed Dose: 18 Gy
Plan Total Prescribed Dose: 50.4 Gy
Reference Point Dosage Given to Date: 37.8 Gy
Reference Point Dosage Given to Date: 37.8 Gy
Reference Point Session Dosage Given: 1.8 Gy
Reference Point Session Dosage Given: 1.8 Gy
Session Number: 21

## 2022-05-31 ENCOUNTER — Other Ambulatory Visit: Payer: Self-pay

## 2022-05-31 ENCOUNTER — Ambulatory Visit: Payer: 59

## 2022-05-31 ENCOUNTER — Ambulatory Visit
Admission: RE | Admit: 2022-05-31 | Discharge: 2022-05-31 | Disposition: A | Discharge status: Home / Self Care | Payer: 59 | Source: Ambulatory Visit | Attending: Radiation Oncology | Admitting: Radiation Oncology

## 2022-05-31 DIAGNOSIS — C50412 Malignant neoplasm of upper-outer quadrant of left female breast: Secondary | ICD-10-CM | POA: Diagnosis not present

## 2022-05-31 LAB — RAD ONC ARIA SESSION SUMMARY
Course Elapsed Days: 30
Plan Fractions Treated to Date: 22
Plan Fractions Treated to Date: 4
Plan Prescribed Dose Per Fraction: 1.8 Gy
Plan Prescribed Dose Per Fraction: 1.8 Gy
Plan Total Fractions Prescribed: 10
Plan Total Fractions Prescribed: 28
Plan Total Prescribed Dose: 18 Gy
Plan Total Prescribed Dose: 50.4 Gy
Reference Point Dosage Given to Date: 39.6 Gy
Reference Point Dosage Given to Date: 39.6 Gy
Reference Point Session Dosage Given: 1.8 Gy
Reference Point Session Dosage Given: 1.8 Gy
Session Number: 22

## 2022-06-01 ENCOUNTER — Ambulatory Visit
Admission: RE | Admit: 2022-06-01 | Discharge: 2022-06-01 | Disposition: A | Discharge status: Home / Self Care | Payer: 59 | Source: Ambulatory Visit | Attending: Radiation Oncology | Admitting: Radiation Oncology

## 2022-06-01 ENCOUNTER — Other Ambulatory Visit: Payer: Self-pay

## 2022-06-01 ENCOUNTER — Ambulatory Visit: Payer: 59

## 2022-06-01 DIAGNOSIS — C50412 Malignant neoplasm of upper-outer quadrant of left female breast: Secondary | ICD-10-CM | POA: Diagnosis not present

## 2022-06-01 LAB — RAD ONC ARIA SESSION SUMMARY
Course Elapsed Days: 31
Plan Fractions Treated to Date: 23
Plan Fractions Treated to Date: 5
Plan Prescribed Dose Per Fraction: 1.8 Gy
Plan Prescribed Dose Per Fraction: 1.8 Gy
Plan Total Fractions Prescribed: 10
Plan Total Fractions Prescribed: 28
Plan Total Prescribed Dose: 18 Gy
Plan Total Prescribed Dose: 50.4 Gy
Reference Point Dosage Given to Date: 41.4 Gy
Reference Point Dosage Given to Date: 41.4 Gy
Reference Point Session Dosage Given: 1.8 Gy
Reference Point Session Dosage Given: 1.8 Gy
Session Number: 23

## 2022-06-02 ENCOUNTER — Ambulatory Visit: Admission: RE | Admit: 2022-06-02 | Payer: 59 | Source: Ambulatory Visit | Admitting: Radiation Oncology

## 2022-06-02 ENCOUNTER — Other Ambulatory Visit: Payer: Self-pay

## 2022-06-02 ENCOUNTER — Ambulatory Visit
Admission: RE | Admit: 2022-06-02 | Discharge: 2022-06-02 | Disposition: A | Discharge status: Home / Self Care | Payer: 59 | Source: Ambulatory Visit | Attending: Radiation Oncology | Admitting: Radiation Oncology

## 2022-06-02 ENCOUNTER — Ambulatory Visit: Payer: 59

## 2022-06-02 DIAGNOSIS — C50412 Malignant neoplasm of upper-outer quadrant of left female breast: Secondary | ICD-10-CM

## 2022-06-02 LAB — RAD ONC ARIA SESSION SUMMARY
Course Elapsed Days: 32
Plan Fractions Treated to Date: 24
Plan Fractions Treated to Date: 6
Plan Prescribed Dose Per Fraction: 1.8 Gy
Plan Prescribed Dose Per Fraction: 1.8 Gy
Plan Total Fractions Prescribed: 10
Plan Total Fractions Prescribed: 28
Plan Total Prescribed Dose: 18 Gy
Plan Total Prescribed Dose: 50.4 Gy
Reference Point Dosage Given to Date: 43.2 Gy
Reference Point Dosage Given to Date: 43.2 Gy
Reference Point Session Dosage Given: 1.8 Gy
Reference Point Session Dosage Given: 1.8 Gy
Session Number: 24

## 2022-06-02 MED ORDER — SONAFINE EX EMUL
1.0000 | Freq: Once | CUTANEOUS | Status: AC
  Administered 2022-06-02: 1 via TOPICAL

## 2022-06-02 MED ORDER — RADIAPLEXRX EX GEL: Freq: Once | CUTANEOUS | Status: AC

## 2022-06-05 ENCOUNTER — Ambulatory Visit
Admission: RE | Admit: 2022-06-05 | Discharge: 2022-06-05 | Disposition: A | Discharge status: Home / Self Care | Payer: 59 | Source: Ambulatory Visit | Attending: Radiation Oncology | Admitting: Radiation Oncology

## 2022-06-05 ENCOUNTER — Other Ambulatory Visit: Payer: Self-pay

## 2022-06-05 ENCOUNTER — Ambulatory Visit: Payer: 59

## 2022-06-05 DIAGNOSIS — C50412 Malignant neoplasm of upper-outer quadrant of left female breast: Secondary | ICD-10-CM | POA: Diagnosis not present

## 2022-06-05 LAB — RAD ONC ARIA SESSION SUMMARY
Course Elapsed Days: 35
Plan Fractions Treated to Date: 25
Plan Fractions Treated to Date: 7
Plan Prescribed Dose Per Fraction: 1.8 Gy
Plan Prescribed Dose Per Fraction: 1.8 Gy
Plan Total Fractions Prescribed: 10
Plan Total Fractions Prescribed: 28
Plan Total Prescribed Dose: 18 Gy
Plan Total Prescribed Dose: 50.4 Gy
Reference Point Dosage Given to Date: 45 Gy
Reference Point Dosage Given to Date: 45 Gy
Reference Point Session Dosage Given: 1.8 Gy
Reference Point Session Dosage Given: 1.8 Gy
Session Number: 25

## 2022-06-06 ENCOUNTER — Ambulatory Visit: Payer: 59

## 2022-06-06 ENCOUNTER — Ambulatory Visit
Admission: RE | Admit: 2022-06-06 | Discharge: 2022-06-06 | Disposition: A | Discharge status: Home / Self Care | Payer: 59 | Source: Ambulatory Visit | Attending: Radiation Oncology | Admitting: Radiation Oncology

## 2022-06-06 ENCOUNTER — Other Ambulatory Visit: Payer: Self-pay

## 2022-06-06 DIAGNOSIS — C50412 Malignant neoplasm of upper-outer quadrant of left female breast: Secondary | ICD-10-CM | POA: Diagnosis not present

## 2022-06-06 LAB — RAD ONC ARIA SESSION SUMMARY
Course Elapsed Days: 36
Plan Fractions Treated to Date: 26
Plan Fractions Treated to Date: 8
Plan Prescribed Dose Per Fraction: 1.8 Gy
Plan Prescribed Dose Per Fraction: 1.8 Gy
Plan Total Fractions Prescribed: 10
Plan Total Fractions Prescribed: 28
Plan Total Prescribed Dose: 18 Gy
Plan Total Prescribed Dose: 50.4 Gy
Reference Point Dosage Given to Date: 46.8 Gy
Reference Point Dosage Given to Date: 46.8 Gy
Reference Point Session Dosage Given: 1.8 Gy
Reference Point Session Dosage Given: 1.8 Gy
Session Number: 26

## 2022-06-07 ENCOUNTER — Other Ambulatory Visit: Payer: Self-pay

## 2022-06-07 ENCOUNTER — Ambulatory Visit: Payer: 59

## 2022-06-07 DIAGNOSIS — C50412 Malignant neoplasm of upper-outer quadrant of left female breast: Secondary | ICD-10-CM | POA: Diagnosis not present

## 2022-06-07 LAB — RAD ONC ARIA SESSION SUMMARY
Course Elapsed Days: 37
Plan Fractions Treated to Date: 27
Plan Fractions Treated to Date: 9
Plan Prescribed Dose Per Fraction: 1.8 Gy
Plan Prescribed Dose Per Fraction: 1.8 Gy
Plan Total Fractions Prescribed: 10
Plan Total Fractions Prescribed: 28
Plan Total Prescribed Dose: 18 Gy
Plan Total Prescribed Dose: 50.4 Gy
Reference Point Dosage Given to Date: 48.6 Gy
Reference Point Dosage Given to Date: 48.6 Gy
Reference Point Session Dosage Given: 1.8 Gy
Reference Point Session Dosage Given: 1.8 Gy
Session Number: 27

## 2022-06-08 ENCOUNTER — Ambulatory Visit: Payer: 59

## 2022-06-08 ENCOUNTER — Other Ambulatory Visit: Payer: Self-pay

## 2022-06-08 ENCOUNTER — Ambulatory Visit
Admission: RE | Admit: 2022-06-08 | Discharge: 2022-06-08 | Disposition: A | Discharge status: Home / Self Care | Payer: 59 | Source: Ambulatory Visit | Attending: Radiation Oncology | Admitting: Radiation Oncology

## 2022-06-08 DIAGNOSIS — C50412 Malignant neoplasm of upper-outer quadrant of left female breast: Secondary | ICD-10-CM | POA: Diagnosis not present

## 2022-06-08 LAB — RAD ONC ARIA SESSION SUMMARY
Course Elapsed Days: 38
Plan Fractions Treated to Date: 10
Plan Fractions Treated to Date: 28
Plan Prescribed Dose Per Fraction: 1.8 Gy
Plan Prescribed Dose Per Fraction: 1.8 Gy
Plan Total Fractions Prescribed: 10
Plan Total Fractions Prescribed: 28
Plan Total Prescribed Dose: 18 Gy
Plan Total Prescribed Dose: 50.4 Gy
Reference Point Dosage Given to Date: 50.4 Gy
Reference Point Dosage Given to Date: 50.4 Gy
Reference Point Session Dosage Given: 1.8 Gy
Reference Point Session Dosage Given: 1.8 Gy
Session Number: 28

## 2022-06-09 ENCOUNTER — Ambulatory Visit: Payer: 59 | Admitting: Radiation Oncology

## 2022-06-09 ENCOUNTER — Other Ambulatory Visit: Payer: Self-pay

## 2022-06-09 ENCOUNTER — Ambulatory Visit: Payer: 59

## 2022-06-09 ENCOUNTER — Ambulatory Visit
Admission: RE | Admit: 2022-06-09 | Discharge: 2022-06-09 | Disposition: A | Discharge status: Home / Self Care | Payer: 59 | Source: Ambulatory Visit | Attending: Radiation Oncology | Admitting: Radiation Oncology

## 2022-06-09 DIAGNOSIS — Z170 Estrogen receptor positive status [ER+]: Secondary | ICD-10-CM

## 2022-06-09 DIAGNOSIS — C50412 Malignant neoplasm of upper-outer quadrant of left female breast: Secondary | ICD-10-CM | POA: Diagnosis not present

## 2022-06-09 LAB — RAD ONC ARIA SESSION SUMMARY
Course Elapsed Days: 39
Plan Fractions Treated to Date: 1
Plan Prescribed Dose Per Fraction: 2 Gy
Plan Total Fractions Prescribed: 5
Plan Total Prescribed Dose: 10 Gy
Reference Point Dosage Given to Date: 2 Gy
Reference Point Session Dosage Given: 2 Gy
Session Number: 29

## 2022-06-09 MED ORDER — RADIAPLEXRX EX GEL: Freq: Once | CUTANEOUS | Status: AC

## 2022-06-12 ENCOUNTER — Other Ambulatory Visit: Payer: Self-pay

## 2022-06-12 ENCOUNTER — Ambulatory Visit
Admission: RE | Admit: 2022-06-12 | Discharge: 2022-06-12 | Disposition: A | Discharge status: Home / Self Care | Payer: 59 | Source: Ambulatory Visit | Attending: Radiation Oncology | Admitting: Radiation Oncology

## 2022-06-12 DIAGNOSIS — C50412 Malignant neoplasm of upper-outer quadrant of left female breast: Secondary | ICD-10-CM | POA: Diagnosis not present

## 2022-06-12 LAB — RAD ONC ARIA SESSION SUMMARY
Course Elapsed Days: 42
Plan Fractions Treated to Date: 2
Plan Prescribed Dose Per Fraction: 2 Gy
Plan Total Fractions Prescribed: 5
Plan Total Prescribed Dose: 10 Gy
Reference Point Dosage Given to Date: 4 Gy
Reference Point Session Dosage Given: 2 Gy
Session Number: 30

## 2022-06-13 ENCOUNTER — Ambulatory Visit: Payer: 59

## 2022-06-13 ENCOUNTER — Ambulatory Visit
Admission: RE | Admit: 2022-06-13 | Discharge: 2022-06-13 | Disposition: A | Discharge status: Home / Self Care | Payer: 59 | Source: Ambulatory Visit | Attending: Radiation Oncology | Admitting: Radiation Oncology

## 2022-06-13 ENCOUNTER — Other Ambulatory Visit: Payer: Self-pay

## 2022-06-13 DIAGNOSIS — C50412 Malignant neoplasm of upper-outer quadrant of left female breast: Secondary | ICD-10-CM | POA: Diagnosis not present

## 2022-06-13 LAB — RAD ONC ARIA SESSION SUMMARY
Course Elapsed Days: 43
Plan Fractions Treated to Date: 3
Plan Prescribed Dose Per Fraction: 2 Gy
Plan Total Fractions Prescribed: 5
Plan Total Prescribed Dose: 10 Gy
Reference Point Dosage Given to Date: 6 Gy
Reference Point Session Dosage Given: 2 Gy
Session Number: 31

## 2022-06-14 ENCOUNTER — Ambulatory Visit
Admission: RE | Admit: 2022-06-14 | Discharge: 2022-06-14 | Disposition: A | Discharge status: Home / Self Care | Payer: 59 | Source: Ambulatory Visit | Attending: Radiation Oncology | Admitting: Radiation Oncology

## 2022-06-14 ENCOUNTER — Ambulatory Visit: Payer: 59

## 2022-06-14 ENCOUNTER — Encounter: Payer: Self-pay | Admitting: *Deleted

## 2022-06-14 ENCOUNTER — Other Ambulatory Visit: Payer: Self-pay

## 2022-06-14 ENCOUNTER — Inpatient Hospital Stay: Payer: 59 | Attending: Hematology | Admitting: Hematology

## 2022-06-14 ENCOUNTER — Encounter: Payer: Self-pay | Admitting: Hematology

## 2022-06-14 VITALS — BP 133/94 | HR 85 | Temp 98.3°F | Resp 17 | Wt 149.0 lb

## 2022-06-14 DIAGNOSIS — C50412 Malignant neoplasm of upper-outer quadrant of left female breast: Secondary | ICD-10-CM | POA: Insufficient documentation

## 2022-06-14 DIAGNOSIS — Z170 Estrogen receptor positive status [ER+]: Secondary | ICD-10-CM | POA: Insufficient documentation

## 2022-06-14 DIAGNOSIS — Z9012 Acquired absence of left breast and nipple: Secondary | ICD-10-CM | POA: Insufficient documentation

## 2022-06-14 LAB — RAD ONC ARIA SESSION SUMMARY
Course Elapsed Days: 44
Plan Fractions Treated to Date: 4
Plan Prescribed Dose Per Fraction: 2 Gy
Plan Total Fractions Prescribed: 5
Plan Total Prescribed Dose: 10 Gy
Reference Point Dosage Given to Date: 8 Gy
Reference Point Session Dosage Given: 2 Gy
Session Number: 32

## 2022-06-14 MED ORDER — LETROZOLE 2.5 MG PO TABS: 2.5000 mg | ORAL_TABLET | Freq: Every day | ORAL | 3 refills | Status: DC

## 2022-06-14 NOTE — Progress Notes (Signed)
[place]   [phone] Fax:([phone]) [phone]   Clinic Follow up Note   Patient Care Team: Delsa Bern, MD as PCP - General (Obstetrics and Gynecology) Vickie Epley, MD as PCP - Electrophysiology (Cardiology) Vickie Epley, MD as PCP - Cardiology (Cardiology) Coralie Keens, MD as Consulting Physician (General Surgery) Truitt Merle, MD as Consulting Physician (Hematology) Kyung Rudd, MD as Consulting Physician (Radiation Oncology) Mauro Kaufmann, RN as Oncology Nurse Navigator Rockwell Germany, RN as Oncology Nurse Navigator  Date of Service:  06/14/2022  CHIEF COMPLAINT: f/u of left breast cancer  CURRENT THERAPY:  Adjuvant post-mastectomy radiation, 05/01/22 - 06/15/22  ASSESSMENT & PLAN:  Autumn Wyatt is a 56 y.o. post-menopausal female with   1. 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 S/p left mastectomy on 01/31/22 by Dr. Ninfa Linden, path showed: 6.1 cm invasive lobular carcinoma with LCIS. Margins and lymph node were negative. -Oncotype RS 10, low risk -she is completing adjuvant post-mastectomy radiation under Dr. Lisbeth Renshaw, last treatment tomorrow 06/15/22. --Given the strong ER and PR positivity, I recommend adjuvant antiestrogen therapy to further reduce her risk of cancer recurrence. She was previously on birth control prior to diagnosis and experienced bleeding immediately after stopping but no recurrence. Per pt, she had labs with her GYN showing she is post-menopausal. Therefore, I recommend trying aromatase inhibitor. We did discuss the differences between aromatase inhibitors versus tamoxifen. The potential benefit and side effects of both include but not limited to hot flash, skin and vaginal dryness, metabolic changes ( increased blood glucose, cholesterol, weight, etc.), slightly in increased risk of  cardiovascular disease, cataracts, muscular and joint discomfort. AI carries the risk of weakening her bones, while tamoxifen will strengthen. Tamoxifen carries the small risk of thrombosis and endometrial cancer, were discussed with her in great details. She is interested, and we will try letrozole first. I advised her to start after Thanksgiving. She agrees.  -Due to lobular histology, I recommend antiestrogen therapy, we discussed later recurrence in lobular carcinoma. -they tell me they are looking into insurance coverage for next year. They will let us know if they need to change their appointments or a referral to another facility.   2. Bone Health -she has never had a bone density screening. We will obtain one for baseline. -we discussed that AI can weaken her bones while tamoxifen can strengthen.   PLAN: -complete radiation tomorrow, 11/2 -start letrozole after Thanksgiving -survivorship in 3 months with Mendel Ryder  -lab and f/u in 6 months -she may see [facility] oncology due to her insurance issue    No problem-specific Assessment & Plan notes found for this encounter.   SUMMARY OF ONCOLOGIC HISTORY: Oncology History  Malignant neoplasm of upper-outer quadrant of left breast in female, estrogen receptor positive ([place])  12/13/2021 Mammogram   CLINICAL DATA:  56 year old female for further evaluation of possible LEFT breast distortion on screening mammogram.   EXAM: DIGITAL DIAGNOSTIC UNILATERAL LEFT MAMMOGRAM WITH TOMOSYNTHESIS AND CAD; ULTRASOUND LEFT BREAST LIMITED  IMPRESSION: 1. Highly suspicious 1.1 cm UPPER-OUTER LEFT breast mass. Tissue sampling is recommended. 2. No abnormal appearing LEFT axillary lymph nodes.   12/14/2021 Initial Biopsy   Diagnosis Breast, left, needle core biopsy, 2:00 5cmfn - INVASIVE MAMMARY CARCINOMA, GRADE 1/2. - MAMMARY CARCINOMA IN SITU. - SEE NOTE. Diagnosis Note The greatest tumor dimension is 0.9 cm.  Addendum: Immunohistochemistry for  E-cadherin is negative consistent with lobular carcinoma.  PROGNOSTIC INDICATORS Results: The tumor cells are EQUIVOCAL for Her2 (2+). Her2 by FISH will be performed and the results reported separately. Estrogen Receptor: 70%, POSITIVE, STRONG STAINING INTENSITY Progesterone Receptor: 90%, POSITIVE, STRONG STAINING INTENSITY Proliferation Marker Ki67: 1%  FLUORESCENCE IN-SITU HYBRIDIZATION Results: GROUP 5: HER2 **NEGATIVE**   12/26/2021 Initial Diagnosis   Malignant neoplasm of upper-outer quadrant of left breast in female, estrogen receptor positive ([place])   02/20/2022 Cancer Staging   Staging form: Breast, AJCC 8th Edition - Pathologic stage from 02/20/2022: Stage IA (pT3, pN0(sn), cM0, G1, ER+, PR+, HER2-, Oncotype DX score: 10) - Signed by Hayden Pedro, PA-C on 02/20/2022 Stage prefix: Initial diagnosis Method of lymph node assessment: Sentinel lymph node biopsy Multigene prognostic tests performed: Oncotype DX Recurrence score range: Less than 11 Histologic grading system: 3 grade system      INTERVAL HISTORY:  Autumn Wyatt is here for a follow up of breast cancer. She was last seen by me on 02/24/22. She presents to the clinic accompanied by her husband. She reports she has recovered well from her surgeries for necrosis and tissue expander replacement. She notes she is doing well with radiation with mild skin burns and fullness.   All other systems were reviewed with the patient and are negative.  MEDICAL HISTORY:  Past Medical History:  Diagnosis Date   Abnormal pap    Allergy    Breast cancer ([place]) 12/14/2021   left breast ILC, LCIS   DIAPHORESIS 12/27/2009   Qualifier: Diagnosis of  By: Burnett Kanaris     Hypertension    Palpitations 12/27/2009   no current problems, Qualifier: Diagnosis of  By: [place], [place] 05/21/2020   had ablation - Qualifier: Diagnosis of  By: Burnett Kanaris    SURGICAL HISTORY: Past  Surgical History:  Procedure Laterality Date   BREAST BIOPSY Left 12/14/2021   BREAST RECONSTRUCTION WITH PLACEMENT OF TISSUE EXPANDER AND ALLODERM Left 01/31/2022   Procedure: BREAST RECONSTRUCTION WITH PLACEMENT OF TISSUE EXPANDER AND ALLODERM;  Surgeon: Irene Limbo, MD;  Location: [place];  Service: Plastics;  Laterality: Left;   CARPAL TUNNEL WITH CUBITAL TUNNEL Bilateral    EYE SURGERY  2006   INCISION AND DRAINAGE OF WOUND Left 02/18/2022   Procedure: IRRIGATION AND DEBRIDEMENT WOUND-LEFT BREAST;  Surgeon: Irene Limbo, MD;  Location: [place];  Service: Plastics;  Laterality: Left;   KNEE SURGERY     MASTECTOMY W/ SENTINEL NODE BIOPSY Left 01/31/2022   Procedure: LEFT NIPPLE SPARING MASTECTOMY AND SENTINEL LYMPH NODE BIOPSY;  Surgeon: Coralie Keens, MD;  Location: [place];  Service: General;  Laterality: Left;   REFRACTIVE SURGERY     SVT ABLATION N/A 05/21/2020   Procedure: SVT ABLATION;  Surgeon: Vickie Epley, MD;  Location: [place] [facility];  Service: Cardiovascular;  Laterality: N/A;   TISSUE EXPANDER PLACEMENT Left 03/24/2022   Procedure: TISSUE EXPANDER - REMOVAL AND REPLACEMENT;  Surgeon: Irene Limbo, MD;  Location: [place];  Service: Plastics;  Laterality: Left;   WISDOM TOOTH EXTRACTION      I have reviewed the social history and family history with the patient and they are unchanged from previous note.  ALLERGIES:  is allergic to codeine, demerol [meperidine], oxycodone, tape, and versed [midazolam].  MEDICATIONS:  Current Outpatient Medications  Medication Sig Dispense Refill   letrozole (FEMARA) 2.5 MG tablet Take 1 tablet (2.5 mg total) by mouth  daily. 30 tablet 3   acetaminophen (TYLENOL) 500 MG tablet Take 500-1,000 mg by mouth every 6 (six) hours as needed (pain.).     ibuprofen (ADVIL,MOTRIN) 600 MG tablet Take 400-600 mg by mouth every 6 (six) hours as needed for headache.     No current  facility-administered medications for this visit.    PHYSICAL EXAMINATION: ECOG PERFORMANCE STATUS: 0 - Asymptomatic  Vitals:   06/14/22 0818  BP: (!) 133/94  Pulse: 85  Resp: 17  Temp: 98.3 F (36.8 C)  SpO2: 100%   Wt Readings from Last 3 Encounters:  06/14/22 149 lb (67.6 kg)  05/22/22 148 lb 8 oz (67.4 kg)  03/24/22 141 lb 12.1 oz (64.3 kg)     GENERAL:alert, no distress and comfortable SKIN: skin color normal, no rashes or significant lesions EYES: normal, Conjunctiva are pink and non-injected, sclera clear  NEURO: alert & oriented x 3 with fluent speech  LABORATORY DATA:  I have reviewed the data as listed    Latest Ref Rng & Units 02/18/2022    6:18 AM 05/19/2020   10:45 AM 01/05/2010   12:23 PM  CBC  WBC 4.0 - 10.5 K/uL 6.4  10.5    Hemoglobin 12.0 - 15.0 g/dL 15.2  14.7  14.2   Hematocrit 36.0 - 46.0 % 44.3  43.3    Platelets 150 - 400 K/uL 256  210          Latest Ref Rng & Units 02/18/2022    6:18 AM 01/24/2022    1:30 PM 05/19/2020   10:45 AM  CMP  Glucose 70 - 99 mg/dL 107  96  89   BUN 6 - 20 mg/dL _0 Creatinine 0.44 - 1.00 mg/dL 0.51  0.50  0.53   Sodium 135 - 145 mmol/L 140  140  138   Potassium 3.5 - 5.1 mmol/L 4.0  3.9  3.8   Chloride 98 - 111 mmol/L 103  109  105   CO2 22 - 32 mmol/L _1 Calcium 8.9 - 10.3 mg/dL 10.0  9.2  9.2       RADIOGRAPHIC STUDIES: I have personally reviewed the radiological images as listed and agreed with the findings in the report. No results found.    No orders of the defined types were placed in this encounter.  All questions were answered. The patient knows to call the clinic with any problems, questions or concerns. No barriers to learning was detected. The total time spent in the appointment was 30 minutes.     Truitt Merle, MD 06/14/2022   I, Wilburn Mylar, am acting as scribe for Truitt Merle, MD.   I have reviewed the above documentation for accuracy and completeness, and I agree with  the above.

## 2022-06-15 ENCOUNTER — Ambulatory Visit
Admission: RE | Admit: 2022-06-15 | Discharge: 2022-06-15 | Disposition: A | Discharge status: Home / Self Care | Payer: 59 | Source: Ambulatory Visit | Attending: Radiation Oncology | Admitting: Radiation Oncology

## 2022-06-15 ENCOUNTER — Other Ambulatory Visit: Payer: Self-pay

## 2022-06-15 ENCOUNTER — Ambulatory Visit: Payer: 59

## 2022-06-15 ENCOUNTER — Encounter: Payer: Self-pay | Admitting: Radiation Oncology

## 2022-06-15 DIAGNOSIS — C50412 Malignant neoplasm of upper-outer quadrant of left female breast: Secondary | ICD-10-CM | POA: Diagnosis not present

## 2022-06-15 LAB — RAD ONC ARIA SESSION SUMMARY
Course Elapsed Days: 45
Plan Fractions Treated to Date: 5
Plan Prescribed Dose Per Fraction: 2 Gy
Plan Total Fractions Prescribed: 5
Plan Total Prescribed Dose: 10 Gy
Reference Point Dosage Given to Date: 10 Gy
Reference Point Session Dosage Given: 2 Gy
Session Number: 33

## 2022-06-16 ENCOUNTER — Ambulatory Visit: Payer: 59

## 2022-06-19 ENCOUNTER — Ambulatory Visit: Payer: 59

## 2022-06-20 ENCOUNTER — Ambulatory Visit: Payer: 59

## 2022-06-21 ENCOUNTER — Ambulatory Visit: Payer: 59

## 2022-06-22 ENCOUNTER — Encounter: Payer: Self-pay | Admitting: *Deleted

## 2022-06-22 ENCOUNTER — Ambulatory Visit: Payer: 59

## 2022-06-22 DIAGNOSIS — Z170 Estrogen receptor positive status [ER+]: Secondary | ICD-10-CM

## 2022-06-22 NOTE — Progress Notes (Signed)
                                                                                                                                                             Patient Name: Autumn Wyatt MRN: [id number] DOB: Feb 27, 1966 Referring Physician: Coralie Keens (Profile Not Attached) Date of Service: 06/15/2022 [facility], [place]                                                        End Of Treatment Note  Diagnoses: C50.412-Malignant neoplasm of upper-outer quadrant of left female breast  Cancer Staging:   Stage IA, pT3N0M0, grade 1  invasive lobular carcinoma of the left breast.  Intent: Curative  Radiation Treatment Dates: 05/01/2022 through 06/15/2022 Site Technique Total Dose (Gy) Dose per Fx (Gy) Completed Fx Beam Energies  Chest Wall, Left: CW_L 3D 50.4/50.4 1.8 28/28 10XFFF  Chest Wall, Left: CW_L_SCLV 3D 50.4/50.4 1.8 28/28 6X, 10X  Chest Wall, Left: CW_L_Bst specialPort 10/10 2 5/5 6E   Narrative: The patient tolerated radiation therapy relatively well. She did have a second opinion about therapy and started a few weeks after treatment was offered to begin, but was able to proceed. She developed dry desquamation in the treatment field but no moist desquamation at the conclusion of her therapy.  Plan: The patient will receive a call in about one month from the radiation oncology department. She will continue follow up with Dr. Burr Medico as well.  ________________________________________________    Carola Rhine, [facility]

## 2022-06-23 ENCOUNTER — Ambulatory Visit: Payer: 59

## 2022-06-26 ENCOUNTER — Other Ambulatory Visit: Payer: Self-pay | Admitting: Nurse Practitioner

## 2022-06-26 ENCOUNTER — Encounter: Payer: Self-pay | Admitting: Hematology

## 2022-06-26 DIAGNOSIS — Z170 Estrogen receptor positive status [ER+]: Secondary | ICD-10-CM

## 2022-07-13 ENCOUNTER — Encounter: Payer: Self-pay | Admitting: Hematology

## 2022-07-31 ENCOUNTER — Ambulatory Visit
Admission: RE | Admit: 2022-07-31 | Discharge: 2022-07-31 | Disposition: A | Discharge status: Home / Self Care | Payer: 59 | Source: Ambulatory Visit | Attending: Radiation Oncology | Admitting: Radiation Oncology

## 2022-07-31 NOTE — Progress Notes (Signed)
  Radiation Oncology         [phone] ________________________________  Name: Autumn Wyatt MRN: [id number]  Date of Service: 07/31/2022  DOB: 08-22-1965  Post Treatment Telephone Note  Diagnosis:  Stage IA, pT3N0M0, grade 1  invasive lobular carcinoma of the left breast.  Intent: Curative  Radiation Treatment Dates: 05/01/2022 through 06/15/2022 Site Technique Total Dose (Gy) Dose per Fx (Gy) Completed Fx Beam Energies  Chest Wall, Left: CW_L 3D 50.4/50.4 1.8 28/28 10XFFF  Chest Wall, Left: CW_L_SCLV 3D 50.4/50.4 1.8 28/28 6X, 10X  Chest Wall, Left: CW_L_Bst specialPort 10/10 2 5/5 6E   (as documented in provider EOT note)   The patient was available for call today.   Symptoms of fatigue have improved since completing therapy.  Symptoms of skin changes have improved since completing therapy.  The patient was encouraged to avoid sun exposure in the area of prior treatment for up to one year following radiation with either sunscreen or by the style of clothing worn in the sun.  The patient has scheduled follow up with her medical oncologist Dr. Burr Medico for ongoing surveillance, and was encouraged to call if she develops concerns or questions regarding radiation.  This concludes the interview.   Leandra Kern, LPN

## 2022-08-21 ENCOUNTER — Ambulatory Visit: Payer: 59 | Attending: Surgery

## 2022-08-21 VITALS — Wt 148.4 lb

## 2022-08-21 DIAGNOSIS — Z483 Aftercare following surgery for neoplasm: Secondary | ICD-10-CM | POA: Insufficient documentation

## 2022-08-21 NOTE — Therapy (Signed)
OUTPATIENT PHYSICAL THERAPY SOZO SCREENING NOTE   Patient Name: Autumn Wyatt MRN: [id number] DOB:27-Apr-1966, 57 y.o., female Today's Date: 08/21/2022  PCP: Delsa Bern, MD REFERRING PROVIDER: Coralie Keens, MD   PT End of Session - 08/21/22 [phone]     Visit Number 2   # unchanged due to screen only   PT Start Time 0849    PT Stop Time 0853    PT Time Calculation (min) 4 min    Activity Tolerance Patient tolerated treatment well    Behavior During Therapy [facility] for tasks assessed/performed             Past Medical History:  Diagnosis Date   Abnormal pap    Allergy    Breast cancer ([place]) 12/14/2021   left breast ILC, LCIS   DIAPHORESIS 12/27/2009   Qualifier: Diagnosis of  By: Burnett Kanaris     Hypertension    Palpitations 12/27/2009   no current problems, Qualifier: Diagnosis of  By: Burnett Kanaris   SUPRAVENTRICULAR TACHYCARDIA 05/21/2020   had ablation - Qualifier: Diagnosis of  By: Burnett Kanaris   Past Surgical History:  Procedure Laterality Date   BREAST BIOPSY Left 12/14/2021   BREAST RECONSTRUCTION WITH PLACEMENT OF TISSUE EXPANDER AND ALLODERM Left 01/31/2022   Procedure: BREAST RECONSTRUCTION WITH PLACEMENT OF TISSUE EXPANDER AND ALLODERM;  Surgeon: Irene Limbo, MD;  Location: [place];  Service: Plastics;  Laterality: Left;   CARPAL TUNNEL WITH CUBITAL TUNNEL Bilateral    EYE SURGERY  2006   INCISION AND DRAINAGE OF WOUND Left 02/18/2022   Procedure: IRRIGATION AND DEBRIDEMENT WOUND-LEFT BREAST;  Surgeon: Irene Limbo, MD;  Location: [place];  Service: Plastics;  Laterality: Left;   KNEE SURGERY     MASTECTOMY W/ SENTINEL NODE BIOPSY Left 01/31/2022   Procedure: LEFT NIPPLE SPARING MASTECTOMY AND SENTINEL LYMPH NODE BIOPSY;  Surgeon: Coralie Keens, MD;  Location: [place];  Service: General;  Laterality: Left;   REFRACTIVE SURGERY     SVT ABLATION N/A 05/21/2020   Procedure: SVT ABLATION;   Surgeon: Vickie Epley, MD;  Location: [place] [facility];  Service: Cardiovascular;  Laterality: N/A;   TISSUE EXPANDER PLACEMENT Left 03/24/2022   Procedure: TISSUE EXPANDER - REMOVAL AND REPLACEMENT;  Surgeon: Irene Limbo, MD;  Location: [place];  Service: Plastics;  Laterality: Left;   WISDOM TOOTH EXTRACTION     Patient Active Problem List   Diagnosis Date Noted   Breast cancer, left breast ([place]) 01/31/2022   Malignant neoplasm of upper-outer quadrant of left breast in female, estrogen receptor positive ([place]) 12/26/2021   SVT (supraventricular tachycardia) ([place]) 12/27/2009   DIAPHORESIS 12/27/2009   PALPITATIONS 12/27/2009   SHORTNESS OF BREATH 12/27/2009    REFERRING DIAG: left breast cancer at risk for lymphedema  THERAPY DIAG: Aftercare following surgery for neoplasm  PERTINENT HISTORY: Pt had a left nipple sparing mastectomy with expander placement on 02/08/22 with 1 negative LN removed. 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 Radiation is recommended with pt deciding. 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 It measures 1.1 cm and is located in the upper outer  quadrant. It is ER+, PR+ and HER 2- with a Ki67 of 1%. She has a family history of breast cancer in her paternal aunt who died in her 4s from breast cancer.She has a history of SVT and had a heart ablation many years ago and has had no cardiac issues  PRECAUTIONS:  left UE Lymphedema risk, None  SUBJECTIVE: Pt returns for her first 3 month L-Dex screen.   PAIN:  Are you having pain? No  SOZO SCREENING: Patient was assessed today using the SOZO machine to determine the lymphedema index score. This was compared to her baseline score. It was determined that she is within the recommended range when compared to her baseline and no further action is needed at this time. She will continue SOZO screenings. These are done every 3 months for 2  years post operatively followed by every 6 months for 2 years, and then annually.   L-DEX FLOWSHEETS - 08/21/22 0800       L-DEX LYMPHEDEMA SCREENING   Measurement Type Unilateral    L-DEX MEASUREMENT EXTREMITY Upper Extremity    POSITION  Standing    DOMINANT SIDE Right    At Risk Side Left    BASELINE SCORE (UNILATERAL) 0.1    L-DEX SCORE (UNILATERAL) 1.3    VALUE CHANGE (UNILAT) 1.2              Otelia Limes, PTA 08/21/2022, 8:53 AM

## 2022-09-14 ENCOUNTER — Other Ambulatory Visit: Payer: Self-pay

## 2022-09-14 ENCOUNTER — Encounter: Payer: Self-pay | Admitting: Adult Health

## 2022-09-14 ENCOUNTER — Inpatient Hospital Stay: Payer: BC Managed Care – PPO | Attending: Hematology | Admitting: Adult Health

## 2022-09-14 ENCOUNTER — Telehealth: Payer: Self-pay

## 2022-09-14 VITALS — BP 139/79 | HR 86 | Temp 98.7°F | Resp 18 | Wt 147.1 lb

## 2022-09-14 DIAGNOSIS — C50412 Malignant neoplasm of upper-outer quadrant of left female breast: Secondary | ICD-10-CM | POA: Insufficient documentation

## 2022-09-14 DIAGNOSIS — Z170 Estrogen receptor positive status [ER+]: Secondary | ICD-10-CM | POA: Diagnosis not present

## 2022-09-14 DIAGNOSIS — Z9012 Acquired absence of left breast and nipple: Secondary | ICD-10-CM | POA: Insufficient documentation

## 2022-09-14 DIAGNOSIS — Z79811 Long term (current) use of aromatase inhibitors: Secondary | ICD-10-CM | POA: Insufficient documentation

## 2022-09-14 NOTE — Progress Notes (Signed)
SURVIVORSHIP VISIT:  BRIEF ONCOLOGIC HISTORY:  Oncology History  Malignant neoplasm of upper-outer quadrant of left breast in female, estrogen receptor positive ([place])  12/13/2021 Mammogram   CLINICAL DATA:  57 year old female for further evaluation of possible LEFT breast distortion on screening mammogram.   EXAM: DIGITAL DIAGNOSTIC UNILATERAL LEFT MAMMOGRAM WITH TOMOSYNTHESIS AND CAD; ULTRASOUND LEFT BREAST LIMITED  IMPRESSION: 1. Highly suspicious 1.1 cm UPPER-OUTER LEFT breast mass. Tissue sampling is recommended. 2. No abnormal appearing LEFT axillary lymph nodes.   12/14/2021 Initial Biopsy   Diagnosis Breast, left, needle core biopsy, 2:00 5cmfn - INVASIVE MAMMARY CARCINOMA, GRADE 1/2. - MAMMARY CARCINOMA IN SITU. - SEE NOTE. Diagnosis Note The greatest tumor dimension is 0.9 cm.  Addendum: Immunohistochemistry for E-cadherin is negative consistent with lobular carcinoma.  PROGNOSTIC INDICATORS Results: The tumor cells are EQUIVOCAL for Her2 (2+). Her2 by FISH will be performed and the results reported separately. Estrogen Receptor: 70%, POSITIVE, STRONG STAINING INTENSITY Progesterone Receptor: 90%, POSITIVE, STRONG STAINING INTENSITY Proliferation Marker Ki67: 1%  FLUORESCENCE IN-SITU HYBRIDIZATION Results: GROUP 5: HER2 **NEGATIVE**   12/26/2021 Initial Diagnosis   Malignant neoplasm of upper-outer quadrant of left breast in female, estrogen receptor positive ([place])   01/31/2022 Surgery   left mastectomy by Dr. Ninfa Linden, path showed: 6.1 cm invasive lobular carcinoma with LCIS. Margins and lymph node were negative.    01/31/2022 Oncotype testing   10/3%, <1% absolute chemotherapy benefit   02/20/2022 Cancer Staging   Staging form: Breast, AJCC 8th Edition - Pathologic stage from 02/20/2022: Stage IA (pT3, pN0(sn), cM0, G1, ER+, PR+, HER2-, Oncotype DX score: 10) - Signed by Hayden Pedro, PA-C on 02/20/2022 Stage prefix: Initial diagnosis Method of lymph node  assessment: Sentinel lymph node biopsy Multigene prognostic tests performed: Oncotype DX Recurrence score range: Less than 11 Histologic grading system: 3 grade system   05/01/2022 - 06/15/2022 Radiation Therapy   Site Technique Total Dose (Gy) Dose per Fx (Gy) Completed Fx Beam Energies  Chest Wall, Left: CW_L 3D 50.4/50.4 1.8 28/28 10XFFF  Chest Wall, Left: CW_L_SCLV 3D 50.4/50.4 1.8 28/28 6X, 10X  Chest Wall, Left: CW_L_Bst specialPort 10/10 2 5/5 6E     06/2022 -  Anti-estrogen oral therapy   Letrozole     INTERVAL HISTORY:  Autumn Wyatt to review her survivorship care plan detailing her treatment course for breast cancer, as well as monitoring long-term side effects of that treatment, education regarding health maintenance, screening, and overall wellness and health promotion.     Overall, Autumn Wyatt reports feeling quite well .  She notes some mild achiness in her hands initially when she wakes up in the morning otherwise she is doing quite well on the letrozole.  REVIEW OF SYSTEMS:  Review of Systems  Constitutional:  Negative for appetite change, chills, fatigue, fever and unexpected weight change.  HENT:   Negative for hearing loss, lump/mass and trouble swallowing.   Eyes:  Negative for eye problems and icterus.  Respiratory:  Negative for chest tightness, cough and shortness of breath.   Cardiovascular:  Negative for chest pain, leg swelling and palpitations.  Gastrointestinal:  Negative for abdominal distention, abdominal pain, constipation, diarrhea, nausea and vomiting.  Endocrine: Negative for hot flashes.  Genitourinary:  Negative for difficulty urinating.   Musculoskeletal:  Positive for arthralgias.  Skin:  Negative for itching and rash.  Neurological:  Negative for dizziness, extremity weakness, headaches and numbness.  Hematological:  Negative for adenopathy. Does not bruise/bleed easily.  Psychiatric/Behavioral:  Negative for depression. The  patient is not  nervous/anxious.   Breast: Denies any new nodularity, masses, tenderness, nipple changes, or nipple discharge.       PAST MEDICAL/SURGICAL HISTORY:  Past Medical History:  Diagnosis Date   Abnormal pap    Allergy    Breast cancer ([place]) 12/14/2021   left breast ILC, LCIS   DIAPHORESIS 12/27/2009   Qualifier: Diagnosis of  By: Burnett Kanaris     Hypertension    Palpitations 12/27/2009   no current problems, Qualifier: Diagnosis of  By: Burnett Kanaris   SUPRAVENTRICULAR TACHYCARDIA 05/21/2020   had ablation - Qualifier: Diagnosis of  By: Burnett Kanaris   Past Surgical History:  Procedure Laterality Date   BREAST BIOPSY Left 12/14/2021   BREAST RECONSTRUCTION WITH PLACEMENT OF TISSUE EXPANDER AND ALLODERM Left 01/31/2022   Procedure: BREAST RECONSTRUCTION WITH PLACEMENT OF TISSUE EXPANDER AND ALLODERM;  Surgeon: Irene Limbo, MD;  Location: [facility];  Service: Plastics;  Laterality: Left;   CARPAL TUNNEL WITH CUBITAL TUNNEL Bilateral    EYE SURGERY  2006   INCISION AND DRAINAGE OF WOUND Left 02/18/2022   Procedure: IRRIGATION AND DEBRIDEMENT WOUND-LEFT BREAST;  Surgeon: Irene Limbo, MD;  Location: [facility];  Service: Plastics;  Laterality: Left;   KNEE SURGERY     MASTECTOMY W/ SENTINEL NODE BIOPSY Left 01/31/2022   Procedure: LEFT NIPPLE SPARING MASTECTOMY AND SENTINEL LYMPH NODE BIOPSY;  Surgeon: Coralie Keens, MD;  Location: [facility];  Service: General;  Laterality: Left;   REFRACTIVE SURGERY     SVT ABLATION N/A 05/21/2020   Procedure: SVT ABLATION;  Surgeon: Vickie Epley, MD;  Location: [facility];  Service: Cardiovascular;  Laterality: N/A;   TISSUE EXPANDER PLACEMENT Left 03/24/2022   Procedure: TISSUE EXPANDER - REMOVAL AND REPLACEMENT;  Surgeon: Irene Limbo, MD;  Location: [facility];  Service: Plastics;  Laterality: Left;   WISDOM TOOTH EXTRACTION       ALLERGIES:  Allergies   Allergen Reactions   Codeine Nausea And Vomiting   Demerol [Meperidine] Nausea And Vomiting   Oxycodone Nausea And Vomiting   Tape Other (See Comments)    Skin tears easily, paper tape ok    Versed [Midazolam] Nausea And Vomiting    ALSO RELATED TO  VERSED     CURRENT MEDICATIONS:  Outpatient Encounter Medications as of 09/14/2022  Medication Sig   acetaminophen (TYLENOL) 500 MG tablet Take 500-1,000 mg by mouth every 6 (six) hours as needed (pain.).   ibuprofen (ADVIL,MOTRIN) 600 MG tablet Take 400-600 mg by mouth every 6 (six) hours as needed for headache.   letrozole (FEMARA) 2.5 MG tablet Take 1 tablet (2.5 mg total) by mouth daily.   No facility-administered encounter medications on file as of 09/14/2022.     ONCOLOGIC FAMILY HISTORY:  Family History  Problem Relation Age of Onset   Chronic granulomatous disease Mother    Diabetes Mother    Chronic granulomatous disease Father    Breast cancer Paternal Aunt 35       died at 61   Diabetes Paternal Grandmother    Heart disease Paternal Grandmother      SOCIAL HISTORY:  Social History   Socioeconomic History   Marital status: Married    Spouse name: Not on file   Number of children: 2   Years of education: Not on file   Highest education level: Not on file  Occupational History   Not on file  Tobacco Use   Smoking  status: Never   Smokeless tobacco: Never  Vaping Use   Vaping Use: Never used  Substance and Sexual Activity   Alcohol use: Yes    Alcohol/week: 5.0 - 10.0 standard drinks of alcohol    Types: 5 - 10 Glasses of wine per week    Comment: social   Drug use: No   Sexual activity: Yes    Birth control/protection: Condom    Comment: not recently  Other Topics Concern   Not on file  Social History Narrative   Not on file   Social Determinants of Health   Financial Resource Strain: Not on file  Food Insecurity: Not on file  Transportation Needs: Not on file  Physical Activity: Not on file   Stress: Not on file  Social Connections: Not on file  Intimate Partner Violence: Not on file     OBSERVATIONS/OBJECTIVE:  BP 139/79 (BP Location: Right Arm)   Pulse 86   Temp 98.7 F (37.1 C) (Oral)   Resp 18   Wt 147 lb 1.6 oz (66.7 kg)   LMP 12/01/2021 (Approximate)   SpO2 100%   BMI 30.74 kg/m  GENERAL: Patient is a well appearing female in no acute distress HEENT:  Sclerae anicteric.  Oropharynx clear and moist. No ulcerations or evidence of oropharyngeal candidiasis. Neck is supple.  NODES:  No cervical, supraclavicular, or axillary lymphadenopathy palpated.  BREAST EXAM:  left breast s/p mastectomy/reconstruction and radiation, no sign of local recurrence, right breast benign LUNGS:  Clear to auscultation bilaterally.  No wheezes or rhonchi. HEART:  Regular rate and rhythm. No murmur appreciated. ABDOMEN:  Soft, nontender.  Positive, normoactive bowel sounds. No organomegaly palpated. MSK:  No focal spinal tenderness to palpation. Full range of motion bilaterally in the upper extremities. EXTREMITIES:  No peripheral edema.   SKIN:  Clear with no obvious rashes or skin changes. No nail dyscrasia. NEURO:  Nonfocal. Well oriented.  Appropriate affect.   LABORATORY DATA:  None for this visit.  DIAGNOSTIC IMAGING:  None for this visit.      ASSESSMENT AND PLAN:  Ms.. Wyatt is a pleasant 57 y.o. female with Stage IA left breast invasive ductal carcinoma, ER+/PR+/HER2-, diagnosed in 12/2021, treated with mastectomy, adjuvant radiation therapy, and anti-estrogen therapy with Letrozole beginning in 06/2022.  She presents to the Survivorship Clinic for our initial meeting and routine follow-up post-completion of treatment for breast cancer.    1. Stage IA left breast cancer:  Autumn Wyatt is continuing to recover from definitive treatment for breast cancer. She will follow-up with her medical oncologist, Dr. Lindi Adie in 6 months with history and physical exam per surveillance  protocol.  She will continue her anti-estrogen therapy with Letrozole. Thus far, she is tolerating the Letrozole well, with minimal side effects. She was instructed to make Dr. Lindi Adie or myself aware if she begins to experience any worsening side effects of the medication and I could see her back in clinic to help manage those side effects, as needed. She is recommended to continue annual right breast screening mammograms.  This is due in April 2024 and I placed orders for this today.  Today, a comprehensive survivorship care plan and treatment summary was reviewed with the patient today detailing her breast cancer diagnosis, treatment course, potential late/long-term effects of treatment, appropriate follow-up care with recommendations for the future, and patient education resources.  A copy of this summary, along with a letter will be sent to the patient's primary care provider via mail/fax/In Basket  message after today's visit.    2. Bone health:  Given Autumn Wyatt's age/history of breast cancer and her current treatment regimen including anti-estrogen therapy with Letrozole, she is at risk for bone demineralization.  Her bone density testing is scheduled on 10/26/2022. She was given education on specific activities to promote bone health.  3. Cancer screening:  Due to Autumn Wyatt's history and her age, she should receive screening for skin cancers, colon cancer, and gynecologic cancers.  The information and recommendations are listed on the patient's comprehensive care plan/treatment summary and were reviewed in detail with the patient.    4. Health maintenance and wellness promotion: Autumn Wyatt was encouraged to consume 5-7 servings of fruits and vegetables per day. We reviewed the "Nutrition Rainbow" handout.  She was also encouraged to engage in moderate to vigorous exercise for 30 minutes per day most days of the week. She was instructed to limit her alcohol consumption and continue to abstain from  tobacco use.     5. Support services/counseling: It is not uncommon for this period of the patient's cancer care trajectory to be one of many emotions and stressors. She was given information regarding our available services and encouraged to contact me with any questions or for help enrolling in any of our support group/programs.    Follow up instructions:    -Return to cancer center in 12/2022 for f/u with Dr. Burr Medico  -Mammogram due in 12/2022 -Bone density testing 10/2022 -She is welcome to return back to the Survivorship Clinic at any time; no additional follow-up needed at this time.  -Consider referral back to survivorship as a long-term survivor for continued surveillance  The patient was provided an opportunity to ask questions and all were answered. The patient agreed with the plan and demonstrated an understanding of the instructions.   Total encounter time:30 minutes*in face-to-face visit time, chart review, lab review, care coordination, order entry, and documentation of the encounter time.    Wilber Bihari, NP 09/14/22 10:29 AM Medical Oncology and Hematology [facility] [place], [place] [address] Tel. [phone]    Fax. [phone]  *Total Encounter Time as defined by the Centers for Medicare and Medicaid Services includes, in addition to the face-to-face time of a patient visit (documented in the note above) non-face-to-face time: obtaining and reviewing outside history, ordering and reviewing medications, tests or procedures, care coordination (communications with other health care professionals or caregivers) and documentation in the medical record.

## 2022-09-14 NOTE — Telephone Encounter (Signed)
Spoke with patient regarding breast screening mammogram. She is aware of when its scheduled and knows to bring her ID and insurance, no deodorant or perfumes. This LPN also made patient aware they take her insurance as well. Patient verbalized understanding.

## 2022-09-21 ENCOUNTER — Other Ambulatory Visit: Payer: Self-pay | Admitting: Hematology

## 2022-10-02 ENCOUNTER — Encounter: Payer: Self-pay | Admitting: Hematology

## 2022-10-24 ENCOUNTER — Encounter: Payer: Self-pay | Admitting: Hematology

## 2022-10-24 ENCOUNTER — Other Ambulatory Visit: Payer: Self-pay

## 2022-10-24 MED ORDER — LETROZOLE 2.5 MG PO TABS: 2.5000 mg | ORAL_TABLET | Freq: Every day | ORAL | 0 refills | Status: DC

## 2022-10-26 ENCOUNTER — Ambulatory Visit
Admission: RE | Admit: 2022-10-26 | Discharge: 2022-10-26 | Disposition: A | Discharge status: Home / Self Care | Payer: BC Managed Care – PPO | Source: Ambulatory Visit | Attending: Nurse Practitioner | Admitting: Nurse Practitioner

## 2022-10-26 DIAGNOSIS — C50412 Malignant neoplasm of upper-outer quadrant of left female breast: Secondary | ICD-10-CM

## 2022-10-27 ENCOUNTER — Encounter: Payer: Self-pay | Admitting: Hematology

## 2022-11-20 ENCOUNTER — Encounter: Payer: Self-pay | Admitting: Hematology

## 2022-11-20 ENCOUNTER — Ambulatory Visit: Payer: BC Managed Care – PPO | Attending: Surgery

## 2022-11-20 ENCOUNTER — Other Ambulatory Visit: Payer: Self-pay | Admitting: *Deleted

## 2022-11-20 VITALS — Wt 149.1 lb

## 2022-11-20 DIAGNOSIS — Z483 Aftercare following surgery for neoplasm: Secondary | ICD-10-CM | POA: Insufficient documentation

## 2022-11-20 DIAGNOSIS — R531 Weakness: Secondary | ICD-10-CM

## 2022-11-20 DIAGNOSIS — C50412 Malignant neoplasm of upper-outer quadrant of left female breast: Secondary | ICD-10-CM

## 2022-11-20 NOTE — Therapy (Signed)
OUTPATIENT PHYSICAL THERAPY SOZO SCREENING NOTE   Patient Name: Autumn Wyatt MRN: [id number] DOB:[birth year], 57 y.o., female Today's Date: 11/20/2022  PCP: Silverio Lay, MD REFERRING PROVIDER: Abigail Miyamoto, MD   PT End of Session - 11/20/22 [phone]     Visit Number 2   # unchanged due to screen only   PT Start Time 0925    PT Stop Time 0929    PT Time Calculation (min) 4 min    Activity Tolerance Patient tolerated treatment well    Behavior During Therapy [facility] for tasks assessed/performed             Past Medical History:  Diagnosis Date   Abnormal pap    Allergy    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, LCIS   DIAPHORESIS 12/27/2009   Qualifier: Diagnosis of  By: Kem Parkinson     Hypertension    Palpitations 12/27/2009   no current problems, Qualifier: Diagnosis of  By: Kem Parkinson   SUPRAVENTRICULAR TACHYCARDIA 05/21/2020   had ablation - Qualifier: Diagnosis of  By: Kem Parkinson   Past Surgical History:  Procedure Laterality Date   BREAST BIOPSY Left 12/14/2021   BREAST RECONSTRUCTION WITH PLACEMENT OF TISSUE EXPANDER AND ALLODERM Left 01/31/2022   Procedure: BREAST RECONSTRUCTION WITH PLACEMENT OF TISSUE EXPANDER AND ALLODERM;  Surgeon: Glenna Fellows, MD;  Location: [facility];  Service: Plastics;  Laterality: Left;   CARPAL TUNNEL WITH CUBITAL TUNNEL Bilateral    EYE SURGERY  2006   INCISION AND DRAINAGE OF WOUND Left 02/18/2022   Procedure: IRRIGATION AND DEBRIDEMENT WOUND-LEFT BREAST;  Surgeon: Glenna Fellows, MD;  Location: [facility];  Service: Plastics;  Laterality: Left;   KNEE SURGERY     MASTECTOMY W/ SENTINEL NODE BIOPSY Left 01/31/2022   Procedure: LEFT NIPPLE SPARING MASTECTOMY AND SENTINEL LYMPH NODE BIOPSY;  Surgeon: Abigail Miyamoto, MD;  Location: [facility];  Service: General;  Laterality: Left;   REFRACTIVE SURGERY     SVT ABLATION N/A 05/21/2020   Procedure: SVT ABLATION;   Surgeon: Lanier Prude, MD;  Location: [facility];  Service: Cardiovascular;  Laterality: N/A;   TISSUE EXPANDER PLACEMENT Left 03/24/2022   Procedure: TISSUE EXPANDER - REMOVAL AND REPLACEMENT;  Surgeon: Glenna Fellows, MD;  [facility];  Service: Plastics;  Laterality: Left;   WISDOM TOOTH EXTRACTION     Patient Active Problem List   Diagnosis Date Noted   Breast cancer, left breast 01/31/2022   Malignant neoplasm of upper-outer quadrant of left breast in female, estrogen receptor positive 12/26/2021   SVT (supraventricular tachycardia) ([facility]) 12/27/2009   DIAPHORESIS 12/27/2009   PALPITATIONS 12/27/2009   SHORTNESS OF BREATH 12/27/2009    REFERRING DIAG: left breast cancer at risk for lymphedema  THERAPY DIAG: Aftercare following surgery for neoplasm  PERTINENT HISTORY: Pt had a left nipple sparing mastectomy with expander placement on 02/08/22 with 1 negative LN removed. 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 Radiation is recommended with pt deciding. 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 It measures 1.1 cm and is located in the upper outer  quadrant. It is ER+, PR+ and HER 2- with a Ki67 of 1%. She has a family history of breast cancer in her paternal aunt who died in her 30s from breast cancer.She has a history of SVT and had a heart ablation many years ago and has had no cardiac issues  PRECAUTIONS: left UE  Lymphedema risk, None  SUBJECTIVE: Pt returns for her first 3 month L-Dex screen.  "My hips have been really tight and I feel fairly weak since finishing all my cancer treatments. I'd like to get some physical therapy to help with that."  PAIN:  Are you having pain? No  SOZO SCREENING: Patient was assessed today using the SOZO machine to determine the lymphedema index score. This was compared to her baseline score. It was determined that she is within the recommended range when  compared to her baseline and no further action is needed at this time. She will continue SOZO screenings. These are done every 3 months for 2 years post operatively followed by every 6 months for 2 years, and then annually. Patient reported a change in status to PTA which initiated the PTA consulting with a PT. PT determined it would be appropriate to initiate therapy at this time. PT requested a referral from patient's provider.   L-DEX FLOWSHEETS - 11/20/22 0900       L-DEX LYMPHEDEMA SCREENING   Measurement Type Unilateral    L-DEX MEASUREMENT EXTREMITY Upper Extremity    POSITION  Standing    DOMINANT SIDE Right    At Risk Side Left    BASELINE SCORE (UNILATERAL) 0.1    L-DEX SCORE (UNILATERAL) 1.7    VALUE CHANGE (UNILAT) 1.6            P: Pt to be evaluated for general strength, mobility and flexibility.    Hermenia Bers, PTA 11/20/2022, 9:29 AM

## 2022-11-21 ENCOUNTER — Other Ambulatory Visit: Payer: Self-pay | Admitting: Nurse Practitioner

## 2022-11-21 ENCOUNTER — Ambulatory Visit
Admission: RE | Admit: 2022-11-21 | Discharge: 2022-11-21 | Disposition: A | Discharge status: Home / Self Care | Payer: BC Managed Care – PPO | Source: Ambulatory Visit | Attending: Adult Health | Admitting: Adult Health

## 2022-11-21 ENCOUNTER — Encounter: Payer: Self-pay | Admitting: Hematology

## 2022-11-21 DIAGNOSIS — Z170 Estrogen receptor positive status [ER+]: Secondary | ICD-10-CM

## 2022-12-10 NOTE — Therapy (Signed)
OUTPATIENT PHYSICAL THERAPY  UPPER EXTREMITY ONCOLOGY EVALUATION  Patient Name: Autumn Wyatt MRN: [id number] DOB:[birth year], 57 y.o., female Today's Date: 12/11/2022  END OF SESSION:  PT End of Session - 12/11/22 1044     Visit Number 1    Number of Visits 1    PT Start Time 1010    PT Stop Time 1045    PT Time Calculation (min) 35 min    Activity Tolerance Patient tolerated treatment well    Behavior During Therapy [facility] for tasks assessed/performed             Past Medical History:  Diagnosis Date   Abnormal pap    Allergy    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, LCIS   DIAPHORESIS 12/27/2009   Qualifier: Diagnosis of  By: Kem Parkinson     Hypertension    Palpitations 12/27/2009   no current problems, Qualifier: Diagnosis of  By: Kem Parkinson   SUPRAVENTRICULAR TACHYCARDIA 05/21/2020   had ablation - Qualifier: Diagnosis of  By: Kem Parkinson   Past Surgical History:  Procedure Laterality Date   BREAST BIOPSY Left 12/14/2021   BREAST RECONSTRUCTION WITH PLACEMENT OF TISSUE EXPANDER AND ALLODERM Left 01/31/2022   Procedure: BREAST RECONSTRUCTION WITH PLACEMENT OF TISSUE EXPANDER AND ALLODERM;  Surgeon: Glenna Fellows, MD;  Location: [facility];  Service: Plastics;  Laterality: Left;   CARPAL TUNNEL WITH CUBITAL TUNNEL Bilateral    EYE SURGERY  2006   INCISION AND DRAINAGE OF WOUND Left 02/18/2022   Procedure: IRRIGATION AND DEBRIDEMENT WOUND-LEFT BREAST;  Surgeon: Glenna Fellows, MD;  Location: [facility];  Service: Plastics;  Laterality: Left;   KNEE SURGERY     MASTECTOMY W/ SENTINEL NODE BIOPSY Left 01/31/2022   Procedure: LEFT NIPPLE SPARING MASTECTOMY AND SENTINEL LYMPH NODE BIOPSY;  Surgeon: Abigail Miyamoto, MD;  Location: [facility];  Service: General;  Laterality: Left;   REFRACTIVE SURGERY     SVT ABLATION N/A 05/21/2020   Procedure: SVT ABLATION;  Surgeon: Lanier Prude, MD;  Location: [facility]  INVASIVE CV LAB;  Service: Cardiovascular;  Laterality: N/A;   TISSUE EXPANDER PLACEMENT Left 03/24/2022   Procedure: TISSUE EXPANDER - REMOVAL AND REPLACEMENT;  Surgeon: Glenna Fellows, MD;  Location: [facility];  Service: Plastics;  Laterality: Left;   WISDOM TOOTH EXTRACTION     Patient Active Problem List   Diagnosis Date Noted   Breast cancer, left breast (HCC) 01/31/2022   Malignant neoplasm of upper-outer quadrant of left breast in female, estrogen receptor positive (HCC) 12/26/2021   SVT (supraventricular tachycardia) (HCC) 12/27/2009   DIAPHORESIS 12/27/2009   PALPITATIONS 12/27/2009   SHORTNESS OF BREATH 12/27/2009    REFERRING PROVIDER: Dr. Mosetta Putt  REFERRING DIAG:  C50.412,Z17.0 (ICD-10-CM) - Malignant neoplasm of upper-outer quadrant of left breast in female, estrogen receptor positive (HCC)  R53.1 (ICD-10-CM) - Generalized weakness    THERAPY DIAG:  Aftercare following surgery for neoplasm  Malignant neoplasm of upper-outer quadrant of left breast in female, estrogen receptor positive (HCC)  Acute pain of left shoulder  ONSET DATE: 02/09/23  Rationale for Evaluation and Treatment: Rehabilitation  SUBJECTIVE:  SUBJECTIVE STATEMENT: I am having random pains.  I see the oncologist on Wednesday.  I feel like my fingers feel fat and puffy and stiff.  I have trigger thumb on the left.  I had a hx of trigger thumb that was released on the right.  Also bil hip pain.    PERTINENT HISTORY: Pt had a left nipple sparing mastectomy with expander placement on 02/08/22 with 1 negative LN removed. 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 Radiation completed. On the letrozole.  Will have switch to implant on 01/19/23.   PAIN:  Are you having pain? Yes NPRS scale: 5/10,   1-2/10 Pain location: The top of the Rt foot,  Left elbow pain Pain orientation: Right  PAIN TYPE: aching for both  Pain description: constant ,  intermittent  Aggravating factors: It depends on the shoes I wear , hurts with straightening, riding the bike  Relieving factors: Tylenol and ibuprofen  PRECAUTIONS: Lt UE lymphedema risk  WEIGHT BEARING RESTRICTIONS: No  FALLS:  Has patient fallen in last 6 months? No  LIVING ENVIRONMENT: Lives with: lives with their family, lives with their spouse, and lives with their son  OCCUPATION: not working  LEISURE: I haven't done pickleball because of the pain.    HAND DOMINANCE: right   PRIOR LEVEL OF FUNCTION: Independent  PATIENT GOALS: Is there anything I can do for this?   OBJECTIVE:  COGNITION: Overall cognitive status: Within functional limits for tasks assessed   PALPATION: +1 ttp top of the foot over cuboid, 4th and 5th metatarsals.  And +1 left elbow at extensor muscle insertion.   POSTURE: rounded shoulders   UPPER EXTREMITY AROM/PROM: WNL no limitations except for a locked thumb on the Left.   UPPER EXTREMITY STRENGTH: Shoulder/Elbow/Wrist: all 5/5 with mild pain reproduced with wrist flexion, extension, pro, sup Strength of Rt: 35#, Strength of Lt 20# with thumb pain  LOWER EXTREMITY STRENGTH: tested seated: All 5/5 with mild pain reproduced with active and resisted DF.    SL stance: 10" WNL without increased pain Squat: WNL no pain  GAIT: Distance walked: in and out of clinic Comments: slightly antalgic with pain always 5/10 no change with change from NWB to WB.   TODAY'S TREATMENT:                                                                                                                                          DATE: 12/11/22 Pt wanted to come see if there was anything she should do for her joint pain.  Eval performed Reviewed HEP stretches for tennis elbow which pt has done in the past so she was  reminded of them today: wrist flexion and ext stretch, eccentric wrist extension but to hold on these until thumb procedure.   Education on shoe wear, ice for the foot   PATIENT EDUCATION:  Education details: per today  Person educated: Patient Education method: Explanation Education comprehension: verbalized understanding  HOME EXERCISE PROGRAM: Wrist flexion and ext stretches Shoe wear, foot and toe stretch in seated   ASSESSMENT:  CLINICAL IMPRESSION: Patient is a 57 y.o. female who was seen today for physical therapy evaluation and treatment for pain she has been having since starting letrozole in the top of the left foot, bil hips, and left elbow.  Pt has hx of bil carpal tunnel, tennis elbow and cubital tunnel release.  She also has a hx of Rt thumb tendon release for trigger thumb and now is having the same problem on the left side which has decreased her grip by almost 50%.  Elbow pain is most likely from limited thumb motion and pain and pt will be seeing the MD for this this week or next week.  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  The Rt foot pain and hip pain could be letrozole related so we discussed this as well as OA related.  She will meet with Dr. Mosetta Putt to discuss the letrozole changes vs orthopedic visit due to pain not changing much. Discussed pool but pt does not like swimming but will think about this.  No needs for PT at this time.   OBJECTIVE IMPAIRMENTS: decreased activity tolerance.   ACTIVITY LIMITATIONS: locomotion level  PARTICIPATION LIMITATIONS: community activity  PERSONAL FACTORS: medication side effect are also affecting patient's functional outcome.   REHAB POTENTIAL: Excellent  CLINICAL DECISION MAKING: Evolving/moderate complexity  EVALUATION COMPLEXITY: Moderate  GOALS: Goals reviewed with patient? Yes  LONG TERM GOALS: Target date: 12/11/22  Will review tennis elbow stretches and self care   Baseline:  Goal status: MET  2.  Will educate pt on aquatic benefits and options  Baseline:  Goal status: MET   PLAN:  PT FREQUENCY: one time visit  PT DURATION: 1 week  PLANNED INTERVENTIONS: Therapeutic exercises, Patient/Family education, Self Care, Manual therapy, and Re-evaluation  PLAN FOR NEXT SESSION: continue Vanessa Ralphs, PT 12/11/2022, 10:45 AM

## 2022-12-11 ENCOUNTER — Ambulatory Visit: Payer: BC Managed Care – PPO | Attending: Hematology | Admitting: Rehabilitation

## 2022-12-11 ENCOUNTER — Encounter: Payer: Self-pay | Admitting: Rehabilitation

## 2022-12-11 ENCOUNTER — Other Ambulatory Visit: Payer: Self-pay

## 2022-12-11 DIAGNOSIS — C50412 Malignant neoplasm of upper-outer quadrant of left female breast: Secondary | ICD-10-CM | POA: Insufficient documentation

## 2022-12-11 DIAGNOSIS — R531 Weakness: Secondary | ICD-10-CM | POA: Diagnosis not present

## 2022-12-11 DIAGNOSIS — M25512 Pain in left shoulder: Secondary | ICD-10-CM | POA: Diagnosis present

## 2022-12-11 DIAGNOSIS — Z170 Estrogen receptor positive status [ER+]: Secondary | ICD-10-CM | POA: Insufficient documentation

## 2022-12-11 DIAGNOSIS — Z483 Aftercare following surgery for neoplasm: Secondary | ICD-10-CM | POA: Diagnosis not present

## 2022-12-14 ENCOUNTER — Inpatient Hospital Stay: Payer: BC Managed Care – PPO | Attending: Hematology | Admitting: Hematology

## 2022-12-14 ENCOUNTER — Inpatient Hospital Stay: Payer: BC Managed Care – PPO

## 2022-12-14 ENCOUNTER — Encounter: Payer: Self-pay | Admitting: Hematology

## 2022-12-14 VITALS — BP 145/85 | HR 81 | Temp 98.7°F | Resp 18 | Ht <= 58 in | Wt 150.6 lb

## 2022-12-14 DIAGNOSIS — Z170 Estrogen receptor positive status [ER+]: Secondary | ICD-10-CM

## 2022-12-14 DIAGNOSIS — M8580 Other specified disorders of bone density and structure, unspecified site: Secondary | ICD-10-CM | POA: Diagnosis not present

## 2022-12-14 DIAGNOSIS — Z9012 Acquired absence of left breast and nipple: Secondary | ICD-10-CM | POA: Insufficient documentation

## 2022-12-14 DIAGNOSIS — Z780 Asymptomatic menopausal state: Secondary | ICD-10-CM | POA: Diagnosis not present

## 2022-12-14 DIAGNOSIS — N951 Menopausal and female climacteric states: Secondary | ICD-10-CM | POA: Diagnosis not present

## 2022-12-14 DIAGNOSIS — C50412 Malignant neoplasm of upper-outer quadrant of left female breast: Secondary | ICD-10-CM | POA: Insufficient documentation

## 2022-12-14 DIAGNOSIS — M2560 Stiffness of unspecified joint, not elsewhere classified: Secondary | ICD-10-CM | POA: Diagnosis not present

## 2022-12-14 DIAGNOSIS — M79673 Pain in unspecified foot: Secondary | ICD-10-CM | POA: Insufficient documentation

## 2022-12-14 DIAGNOSIS — M25559 Pain in unspecified hip: Secondary | ICD-10-CM | POA: Diagnosis not present

## 2022-12-14 DIAGNOSIS — Z79811 Long term (current) use of aromatase inhibitors: Secondary | ICD-10-CM | POA: Diagnosis not present

## 2022-12-14 LAB — CBC WITH DIFFERENTIAL (CANCER CENTER ONLY)
Abs Immature Granulocytes: 0.01 10*3/uL (ref 0.00–0.07)
Basophils Absolute: 0 10*3/uL (ref 0.0–0.1)
Basophils Relative: 1 %
Eosinophils Absolute: 0.1 10*3/uL (ref 0.0–0.5)
Eosinophils Relative: 3 %
HCT: 44 % (ref 36.0–46.0)
Hemoglobin: 15.2 g/dL — ABNORMAL HIGH (ref 12.0–15.0)
Immature Granulocytes: 0 %
Lymphocytes Relative: 27 %
Lymphs Abs: 1.2 10*3/uL (ref 0.7–4.0)
MCH: 33.3 pg (ref 26.0–34.0)
MCHC: 34.5 g/dL (ref 30.0–36.0)
MCV: 96.5 fL (ref 80.0–100.0)
Monocytes Absolute: 0.4 10*3/uL (ref 0.1–1.0)
Monocytes Relative: 10 %
Neutro Abs: 2.7 10*3/uL (ref 1.7–7.7)
Neutrophils Relative %: 59 %
Platelet Count: 179 10*3/uL (ref 150–400)
RBC: 4.56 MIL/uL (ref 3.87–5.11)
RDW: 12.5 % (ref 11.5–15.5)
WBC Count: 4.4 10*3/uL (ref 4.0–10.5)
nRBC: 0 % (ref 0.0–0.2)

## 2022-12-14 LAB — CMP (CANCER CENTER ONLY)
ALT: 24 U/L (ref 0–44)
AST: 21 U/L (ref 15–41)
Albumin: 4.4 g/dL (ref 3.5–5.0)
Alkaline Phosphatase: 75 U/L (ref 38–126)
Anion gap: 5 (ref 5–15)
BUN: 16 mg/dL (ref 6–20)
CO2: 30 mmol/L (ref 22–32)
Calcium: 9.7 mg/dL (ref 8.9–10.3)
Chloride: 107 mmol/L (ref 98–111)
Creatinine: 0.58 mg/dL (ref 0.44–1.00)
GFR, Estimated: >60 mL/min (ref >60)
Glucose, Bld: 115 mg/dL — ABNORMAL HIGH (ref 70–99)
Potassium: 4 mmol/L (ref 3.5–5.1)
Sodium: 142 mmol/L (ref 135–145)
Total Bilirubin: 0.6 mg/dL (ref 0.3–1.2)
Total Protein: 7.6 g/dL (ref 6.5–8.1)

## 2022-12-14 LAB — VITAMIN D 25 HYDROXY (VIT D DEFICIENCY, FRACTURES): Vit D, 25-Hydroxy: 26.99 ng/mL — ABNORMAL LOW (ref 30–100)

## 2022-12-14 MED ORDER — EXEMESTANE 25 MG PO TABS: 25.0000 mg | ORAL_TABLET | Freq: Every day | ORAL | 2 refills | Status: DC

## 2022-12-14 NOTE — Assessment & Plan Note (Signed)
-  DEXA on 10/16/22 showed T-1.7 at AP, consistent with osteopenia.  No high risk fracture. -We discussed AI can decrease bone density. -I encouraged her to take calcium and vitamin D

## 2022-12-14 NOTE — Progress Notes (Signed)
[facility]   Telephone:([phone] Fax:([phone]) [phone]   Clinic Follow up Note   Patient Care Team: Silverio Lay, MD as PCP - General (Obstetrics and Gynecology) Lanier Prude, MD as PCP - Electrophysiology (Cardiology) Abigail Miyamoto, MD as Consulting Physician (General Surgery) Malachy Mood, MD as Consulting Physician (Hematology) Dorothy Puffer, MD as Consulting Physician (Radiation Oncology)  Date of Service:  12/14/2022  CHIEF COMPLAINT: f/u of left breast cancer  CURRENT THERAPY:  Letrozole 06/2022  ASSESSMENT:  Autumn Wyatt is a 57 y.o. 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 S/p left mastectomy on 01/31/22 by Dr. Magnus Ivan, path showed: 6.1 cm invasive lobular carcinoma with LCIS. Margins and lymph node were negative. -Oncotype RS 10, low risk, adjuvant chemotherapy is not recommended. -she completed adjuvant radiation -She started adjuvant letrozole in November 2023, she initially tolerated well, but has developed significant bilateral hip pain (improved now), and pain on bilateral feet, which has limited her activities.  She also has moderate hot flashes.  Given the significant side effect, I will stop her letrozole. -I recommended her try exemestane, hope arthralgia is much less.  I reviewed the benefit and potential side effect, she was initially reluctant to consider, we discussed the risk of metastatic recurrence due to lobular histology, and large tumor.  Both patient and her husband had a multiple questions.  After lengthy discussion, she agrees to proceed -We also discussed option of tamoxifen, if she is not able to tolerate exemestane.  Osteopenia after menopause -DEXA on 10/16/22 showed T-1.7 at AP, consistent with osteopenia.  No high risk fracture.  I  reviewed with patient. -We discussed AI can decrease bone density. -I encouraged her to take calcium and vitamin D    PLAN: -lab reviewed -Discontinue Letrozole due to arthralgia -I called in exemestane to start in 1-2 months  -Encourage the pt to be active and eat healthy -Discuss Tumor Marker and will follow  -lab and f/u in 3 months   SUMMARY OF ONCOLOGIC HISTORY: Oncology History Overview Note   Cancer Staging  Malignant neoplasm of upper-outer quadrant of left breast in female, estrogen receptor positive (HCC) Staging form: Breast, AJCC 8th Edition - Clinical: Stage IA (cT1c, cN0, cM0, G2, ER+, PR+, HER2-) - Unsigned Stage prefix: Initial diagnosis Method of lymph node assessment: Clinical Histologic grading system: 3 grade system - Pathologic stage from 02/20/2022: Stage IA (pT3, pN0(sn), cM0, G1, ER+, PR+, HER2-, Oncotype DX score: 10) - Signed by Ronny Bacon, PA-C on 02/20/2022 Stage prefix: Initial diagnosis Method of lymph node assessment: Sentinel lymph node biopsy Multigene prognostic tests performed: Oncotype DX Recurrence score range: Less than 11 Histologic grading system: 3 grade system     Malignant neoplasm of upper-outer quadrant of left breast in female, estrogen receptor positive (HCC)  12/13/2021 Mammogram   CLINICAL DATA:  57 year old female for further evaluation of possible LEFT breast distortion on screening mammogram.   EXAM: DIGITAL DIAGNOSTIC UNILATERAL LEFT MAMMOGRAM WITH TOMOSYNTHESIS AND CAD; ULTRASOUND LEFT BREAST LIMITED  IMPRESSION: 1. Highly suspicious 1.1 cm UPPER-OUTER LEFT breast mass. Tissue sampling is recommended. 2. No abnormal appearing LEFT axillary lymph nodes.   12/14/2021 Initial Biopsy   Diagnosis Breast, left, needle core biopsy, 2:00 5cmfn - INVASIVE MAMMARY CARCINOMA, GRADE 1/2. - MAMMARY CARCINOMA IN SITU. - SEE NOTE. Diagnosis Note The  greatest tumor dimension is 0.9 cm.  Addendum: Immunohistochemistry for  E-cadherin is negative consistent with lobular carcinoma.  PROGNOSTIC INDICATORS Results: The tumor cells are EQUIVOCAL for Her2 (2+). Her2 by FISH will be performed and the results reported separately. Estrogen Receptor: 70%, POSITIVE, STRONG STAINING INTENSITY Progesterone Receptor: 90%, POSITIVE, STRONG STAINING INTENSITY Proliferation Marker Ki67: 1%  FLUORESCENCE IN-SITU HYBRIDIZATION Results: GROUP 5: HER2 **NEGATIVE**   01/31/2022 Surgery   left mastectomy by Dr. Magnus Ivan, path showed: 6.1 cm invasive lobular carcinoma with LCIS. Margins and lymph node were negative.    01/31/2022 Oncotype testing   10/3%, <1% absolute chemotherapy benefit   02/20/2022 Cancer Staging   Staging form: Breast, AJCC 8th Edition - Pathologic stage from 02/20/2022: Stage IA (pT3, pN0(sn), cM0, G1, ER+, PR+, HER2-, Oncotype DX score: 10) - Signed by Ronny Bacon, PA-C on 02/20/2022 Stage prefix: Initial diagnosis Method of lymph node assessment: Sentinel lymph node biopsy Multigene prognostic tests performed: Oncotype DX Recurrence score range: Less than 11 Histologic grading system: 3 grade system   05/01/2022 - 06/15/2022 Radiation Therapy   Site Technique Total Dose (Gy) Dose per Fx (Gy) Completed Fx Beam Energies  Chest Wall, Left: CW_L 3D 50.4/50.4 1.8 28/28 10XFFF  Chest Wall, Left: CW_L_SCLV 3D 50.4/50.4 1.8 28/28 6X, 10X  Chest Wall, Left: CW_L_Bst specialPort 10/10 2 5/5 6E     06/2022 -  Anti-estrogen oral therapy   Letrozole      INTERVAL HISTORY:  Autumn Wyatt is here for a follow up of left breast cancer. She was last seen by NP Mardella Layman on 09/14/2022. She presents to the clinic accompanied by husband. Pt state  that she has been having joint pain it started back In FEB /2024.Pt also report of hot flashes and stiff joints in the hands. Pt state the pain in the hip is gone but the pain in her foot is unbearable.       All other systems were reviewed with the patient and  are negative.  MEDICAL HISTORY:  Past Medical History:  Diagnosis Date   Abnormal pap    Allergy    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, LCIS   DIAPHORESIS 12/27/2009   Qualifier: Diagnosis of  By: Kem Parkinson     Hypertension    Palpitations 12/27/2009   no current problems, Qualifier: Diagnosis of  By: Kem Parkinson   SUPRAVENTRICULAR TACHYCARDIA 05/21/2020   had ablation - Qualifier: Diagnosis of  By: Kem Parkinson    SURGICAL HISTORY: Past Surgical History:  Procedure Laterality Date   BREAST BIOPSY Left 12/14/2021   BREAST RECONSTRUCTION WITH PLACEMENT OF TISSUE EXPANDER AND ALLODERM Left 01/31/2022   Procedure: BREAST RECONSTRUCTION WITH PLACEMENT OF TISSUE EXPANDER AND ALLODERM;  Surgeon: Glenna Fellows, MD;  Location: [facility];  Service: Plastics;  Laterality: Left;   CARPAL TUNNEL WITH CUBITAL TUNNEL Bilateral    EYE SURGERY  2006   INCISION AND DRAINAGE OF WOUND Left 02/18/2022   Procedure: IRRIGATION AND DEBRIDEMENT WOUND-LEFT BREAST;  Surgeon: Glenna Fellows, MD;  Location: [facility];  Service: Plastics;  Laterality: Left;   KNEE SURGERY     MASTECTOMY W/ SENTINEL NODE BIOPSY Left 01/31/2022   Procedure: LEFT NIPPLE SPARING MASTECTOMY AND SENTINEL LYMPH NODE BIOPSY;  Surgeon: Abigail Miyamoto, MD;  Location: [facility];  Service: General;  Laterality: Left;   REFRACTIVE SURGERY     SVT ABLATION N/A 05/21/2020   Procedure: SVT ABLATION;  Surgeon: Lanier Prude,  MD;  Location: [facility];  Service: Cardiovascular;  Laterality: N/A;   TISSUE EXPANDER PLACEMENT Left 03/24/2022   Procedure: TISSUE EXPANDER - REMOVAL AND REPLACEMENT;  Surgeon: Glenna Fellows, MD;  Location: [facility];  Service: Plastics;  Laterality: Left;   WISDOM TOOTH EXTRACTION      I have reviewed the social history and family history with the patient and they are unchanged from previous note.  ALLERGIES:  is  allergic to codeine, demerol [meperidine], oxycodone, tape, and versed [midazolam].  MEDICATIONS:  Current Outpatient Medications  Medication Sig Dispense Refill   exemestane (AROMASIN) 25 MG tablet Take 1 tablet (25 mg total) by mouth daily after breakfast. 30 tablet 2   acetaminophen (TYLENOL) 500 MG tablet Take 500-1,000 mg by mouth every 6 (six) hours as needed (pain.).     ibuprofen (ADVIL,MOTRIN) 600 MG tablet Take 400-600 mg by mouth every 6 (six) hours as needed for headache.     No current facility-administered medications for this visit.    PHYSICAL EXAMINATION: ECOG PERFORMANCE STATUS: 1 - Symptomatic but completely ambulatory  Vitals:   12/14/22 1108  BP: (!) 145/85  Pulse: 81  Resp: 18  Temp: 98.7 F (37.1 C)  SpO2: 100%   Wt Readings from Last 3 Encounters:  12/14/22 150 lb 9.6 oz (68.3 kg)  11/20/22 149 lb 2 oz (67.6 kg)  09/14/22 147 lb 1.6 oz (66.7 kg)     GENERAL:alert, no distress and comfortable SKIN: skin color normal, no rashes or significant lesions EYES: normal, Conjunctiva are pink and non-injected, sclera clear  NEURO: alert & oriented x 3 with fluent speech NECK: (-) supple, thyroid normal size, non-tender, without nodularity LYMPH: (-) no palpable lymphadenopathy in the cervical, axillary  ABDOMEN:(-) abdomen soft, n(-) on-tender and(-)  normal bowel sounds BREAST : RT breast  no palpable mass, LT breast mastectomy no palpable mass breast exam benign     LABORATORY DATA:  I have reviewed the data as listed    Latest Ref Rng & Units 12/14/2022   10:34 AM 02/18/2022    6:18 AM 05/19/2020   10:45 AM  CBC  WBC 4.0 - 10.5 K/uL 4.4  6.4  10.5   Hemoglobin 12.0 - 15.0 g/dL 16.1  09.6  04.5   Hematocrit 36.0 - 46.0 % 44.0  44.3  43.3   Platelets 150 - 400 K/uL 179  256  210         Latest Ref Rng & Units 12/14/2022   10:34 AM 02/18/2022    6:18 AM 01/24/2022    1:30 PM  CMP  Glucose 70 - 99 mg/dL 409  811  96   BUN 6 - 20 mg/dL 16  15  12     Creatinine 0.44 - 1.00 mg/dL 9.14  7.82  9.56   Sodium 135 - 145 mmol/L 142  140  140   Potassium 3.5 - 5.1 mmol/L 4.0  4.0  3.9   Chloride 98 - 111 mmol/L 107  103  109   CO2 22 - 32 mmol/L 30  21  24    Calcium 8.9 - 10.3 mg/dL 9.7  21.3  9.2   Total Protein 6.5 - 8.1 g/dL 7.6     Total Bilirubin 0.3 - 1.2 mg/dL 0.6     Alkaline Phos 38 - 126 U/L 75     AST 15 - 41 U/L 21     ALT 0 - 44 U/L 24  RADIOGRAPHIC STUDIES: I have personally reviewed the radiological images as listed and agreed with the findings in the report. No results found.    Orders Placed This Encounter  Procedures   Cancer antigen 27.29    Standing Status:   Standing    Number of Occurrences:   20    Standing Expiration Date:   03/16/2023   All questions were answered. The patient knows to call the clinic with any problems, questions or concerns. No barriers to learning was detected. The total time spent in the appointment was 40 minutes.     Malachy Mood, MD 12/14/2022   Carolin Coy, CMA, am acting as scribe for Malachy Mood, MD.   I have reviewed the above documentation for accuracy and completeness, and I agree with the above.

## 2022-12-14 NOTE — Assessment & Plan Note (Signed)
invasive lobular carcinoma, stage IA, p(T3, N0), ER+/PR+/HER2-, Grade 1/2, (+)  LCIS -found on screening mammogram. S/p left mastectomy on 01/31/22 by Dr. Magnus Ivan, path showed: 6.1 cm invasive lobular carcinoma with LCIS. Margins and lymph node were negative. -Oncotype RS 10, low risk -she is completing adjuvant post-mastectomy radiation under Dr. Mitzi Hansen, last treatment tomorrow 06/15/22. --Given the strong ER and PR positivity, I recommend adjuvant antiestrogen therapy to further reduce her risk of cancer recurrence. She was previously on birth control prior to diagnosis and experienced bleeding immediately after stopping but no recurrence. Per pt, she had labs with her GYN showing she is post-menopausal. Therefore, I recommend trying aromatase inhibitor. We did discuss the differences between aromatase inhibitors versus tamoxifen. The potential benefit and side effects of both include but not limited to hot flash, skin and vaginal dryness, metabolic changes ( increased blood glucose, cholesterol, weight, etc.), slightly in increased risk of cardiovascular disease, cataracts, muscular and joint discomfort. AI carries the risk of weakening her bones, while tamoxifen will strengthen. Tamoxifen carries the small risk of thrombosis and endometrial cancer, were discussed with her in great details. She is interested, and we will try letrozole first. I advised her to start after Thanksgiving. She agrees.  -Due to lobular histology, I recommend antiestrogen therapy, we discussed later recurrence in lobular carcinoma.

## 2022-12-16 ENCOUNTER — Other Ambulatory Visit: Payer: Self-pay | Admitting: Nurse Practitioner

## 2022-12-16 ENCOUNTER — Encounter: Payer: Self-pay | Admitting: Hematology

## 2022-12-16 MED ORDER — ERGOCALCIFEROL 1.25 MG (50000 UT) PO CAPS: 50000.0000 [IU] | ORAL_CAPSULE | ORAL | 0 refills | Status: DC

## 2022-12-31 ENCOUNTER — Other Ambulatory Visit: Payer: Self-pay | Admitting: Nurse Practitioner

## 2022-12-31 DIAGNOSIS — C50412 Malignant neoplasm of upper-outer quadrant of left female breast: Secondary | ICD-10-CM

## 2023-01-01 NOTE — H&P (Addendum)
ubjective:      HPI   11 months post op left mastectomy with immediate expander based reconstruction. Course complicated by necrosis areola and mastectomy flap sparing nipple. Underwent excision left NAC and closure at 2.5 w post op . Experienced left expander deflation after in office expansion week 7 and underwent replacement. Plan implant exchange next month.   Presented following screening MMG with left breast distortion. Diagnostic MMG/US showed a 1.1 x 1 x 0.8 cm mass at 2 o'clock LEFT breast 5 cmfn. Axilla normal. 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    MRI demonstrated known malignancy left UOQ measuring 1.3 cm.  Additional NME extends from the anterior aspect of the known malignancy to the base of the nipple, measuring a total of 6.8 cm. MR guided biopsy labeled left breast outer third NME showed ILC ER/PR+ Her2-. Given span of disease, mastectomy recommended.    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 Pathology from [facility] resection/skin flap necrosis benign.    Oncotype 10. Declined genetics. Letrozole discontinued due to side effect. Plant start exemestane post next reconstruction surgery.   Right MMG 11/2022 benign.   Prior 32 B. Desires larger. Left mastectomy- no weight specimen recorded at pathology   Lives with spouse. Two adult children. Daughter is ICU RN at [employer].   Review of Systems     Objective:  Physical Exam   CV: normal heart sounds, normal rhythm Pulm: clear to auscultation bilateral  Chest: left chest expanded with hyperpigmentation of RT pseudo ptosis right  SN to nipple R 22.5  BW R 18  Nipple to IMF R 7 Left chest soft tissue pinch < 1 cm Right breast soft tissue pinch > 4 cm     Assessment:    Left breast ca UOQ ER+ S/p left NSM SLN prepectoral TE/ADM (Alloderm) reconstruction. S/p debridement mastectomy flap/excision left NAC Left expander deflation s/p removal  replacement Adjuvant radiation left chest   Plan:    Reviewed increased risks reconstruction in setting RT including capsular contracture, wound healing problems which can present as exposure implant.   Counseled encouraging data with prepectoral position, use ADM with regards to radiation. However some type of autologous tissue reconstruction post radiation can decrease risks back to baseline. Counseled options of implant exchange alone, LD flap over implant, or referral to microsurgeon to discuss other autologous options. Reviewed LD donor site risks including weakness, seroma. Reviewed hospital stay, drains, recovery for each. Even with LD flap over implant, counseled in setting RT implant based reconstruction will appear rounder higher on chest with natural breast. Purely autologous reconstruction would address RT risks and would offer more symmetry. Patient desires to proceed with implant exchange alone on left. Patient desires right augmentation. Reviewed size she can go larger will be limited by size implant that can fit over left chest.   Over right reviewed options subglandular vs subpectoral position. Latter may offer less risk capsular contracture, less risk rippling with associated cutting of muscle more discomfort vs subglandular. Both settings should expect soft tissue descent breast tissue over implant and need for breast lift. Presently do no recommend this. Plan subglandular position, IMF incision.   Reviewed saline vs silicone.  As in prepectoral position I do recommend HCG or capacity filled silicone implants to reduce risk visible rippling.  Reviewed MRI or [place] surveillance for rupture with silicone implants. Reviewed risks AP flipping that may be more noticeable with 5th  generation implants, may require surgery to correct. Discussed risk ALCL with textured devices. Reviewed implants are not permanent devices and additional surgery related to them is common. Discussed intracapsular vs  extracapsular rupture. She has elected for Soft Touch. I provided examples of saline, 4th generation, and Inspira Soft Touch and Cohesive gel implants for review. We discussed that with any implant she will be able to feel parts of implant including shell implant stamp or fill tab.    Completed Eugenie Filler physician patient checklist.   She performed sizing over right last visit and liked 425 ml. Again I reviewed with patient we are limited by left chest and I do not feel I can match such as large augmentation on right to the left based on the Natrelle implants in her range of chest width. Lat visit, I provided patient and her husband the implant order forms and counseled that I anticipate the largest implant that her left chest can accommodate is BW 12-13 cm. We discussed that the projections noted on the forms are when measured on flat surface outside of body and are not exact inside a human's soft tissue. We have discussed 350 ml approximate augmentation may be more achievable in a lower profile. Counseled no guarantee on cup size. Counseled the expander and implant are limited in part by chest width. Further expansion of expander would not change her chest widtth. Reviewed a limited range of sizers will be placed during surgery and I will make final decision during surgery. Husband reports since last visit they have used tape measure at home to measure chest and against implant dimensions on order form and came to conclusion 13 cm or larger was too wide for chest.    Additional risks including but not limited to bleeding, hematoma, seroma, infection, need for additional procedures, unacceptable cosmetic result, asymmetry, blood clots in legs or lungs, damage to adjacent structures reviewed. We discussed risk NAC necrosis and loss or altered sensation is a risk of augmentation but much smaller than compared with nipple sparing mastectomy.    Rx for robaxin given. Declined prescription pain medication  rx. She will have open treatment left thumb trigger finger few days prior to surgery in office of her Orthopaedic surgeon. She reports she will be given Keflex. She will tell me day of surgery how many days antibiotic she was prescribed and I will send additional Rx if needed on that date.    Natrelle 133S FV-12-T 400 ml tissue expander placed  total fill volume saline    Glenna Fellows, MD [facility] Plastic & Reconstructive Surgery  Office/ physician access line after hours [phone]

## 2023-01-09 ENCOUNTER — Other Ambulatory Visit: Payer: Self-pay

## 2023-01-09 ENCOUNTER — Encounter (HOSPITAL_BASED_OUTPATIENT_CLINIC_OR_DEPARTMENT_OTHER): Payer: Self-pay | Admitting: Plastic Surgery

## 2023-01-15 HISTORY — PX: TRIGGER FINGER RELEASE: SHX641

## 2023-01-15 NOTE — Progress Notes (Signed)

## 2023-01-19 ENCOUNTER — Encounter (HOSPITAL_BASED_OUTPATIENT_CLINIC_OR_DEPARTMENT_OTHER): Payer: Self-pay | Admitting: Plastic Surgery

## 2023-01-19 ENCOUNTER — Ambulatory Visit (HOSPITAL_BASED_OUTPATIENT_CLINIC_OR_DEPARTMENT_OTHER): Payer: BC Managed Care – PPO | Admitting: Anesthesiology

## 2023-01-19 ENCOUNTER — Ambulatory Visit (HOSPITAL_BASED_OUTPATIENT_CLINIC_OR_DEPARTMENT_OTHER)
Admission: RE | Admit: 2023-01-19 | Discharge: 2023-01-19 | Disposition: A | Discharge status: Home / Self Care | Payer: BC Managed Care – PPO | Attending: Plastic Surgery | Admitting: Plastic Surgery

## 2023-01-19 ENCOUNTER — Other Ambulatory Visit: Payer: Self-pay

## 2023-01-19 ENCOUNTER — Encounter (HOSPITAL_BASED_OUTPATIENT_CLINIC_OR_DEPARTMENT_OTHER)
Admission: RE | Disposition: A | Discharge status: Home / Self Care | Payer: Self-pay | Source: Home / Self Care | Attending: Plastic Surgery

## 2023-01-19 DIAGNOSIS — Z9889 Other specified postprocedural states: Secondary | ICD-10-CM | POA: Diagnosis not present

## 2023-01-19 DIAGNOSIS — Z01818 Encounter for other preprocedural examination: Secondary | ICD-10-CM

## 2023-01-19 DIAGNOSIS — Z08 Encounter for follow-up examination after completed treatment for malignant neoplasm: Secondary | ICD-10-CM | POA: Diagnosis not present

## 2023-01-19 DIAGNOSIS — Z9012 Acquired absence of left breast and nipple: Secondary | ICD-10-CM | POA: Insufficient documentation

## 2023-01-19 DIAGNOSIS — Z923 Personal history of irradiation: Secondary | ICD-10-CM | POA: Insufficient documentation

## 2023-01-19 DIAGNOSIS — Z853 Personal history of malignant neoplasm of breast: Secondary | ICD-10-CM | POA: Diagnosis present

## 2023-01-19 HISTORY — PX: BREAST ENHANCEMENT SURGERY: SHX7

## 2023-01-19 HISTORY — PX: REMOVAL OF TISSUE EXPANDER AND PLACEMENT OF IMPLANT: SHX6457

## 2023-01-19 SURGERY — REMOVAL, TISSUE EXPANDER, BREAST, WITH IMPLANT INSERTION
Anesthesia: General | Site: Chest | Laterality: Right

## 2023-01-19 MED ORDER — TRAMADOL HCL 50 MG PO TABS
50.0000 mg | ORAL_TABLET | Freq: Four times a day (QID) | ORAL | Status: DC | PRN
  Administered 2023-01-19: 50 mg via ORAL

## 2023-01-19 MED ORDER — DEXMEDETOMIDINE HCL IN NACL 80 MCG/20ML IV SOLN
INTRAVENOUS | Status: AC
  Filled 2023-01-19: qty 20

## 2023-01-19 MED ORDER — CELECOXIB 200 MG PO CAPS
ORAL_CAPSULE | ORAL | Status: AC
  Filled 2023-01-19: qty 1

## 2023-01-19 MED ORDER — FENTANYL CITRATE (PF) 100 MCG/2ML IJ SOLN
INTRAMUSCULAR | Status: DC | PRN
  Administered 2023-01-19 (×2): 50 ug via INTRAVENOUS
  Administered 2023-01-19: 100 ug via INTRAVENOUS

## 2023-01-19 MED ORDER — TRAMADOL HCL 50 MG PO TABS
ORAL_TABLET | ORAL | Status: AC
  Filled 2023-01-19: qty 1

## 2023-01-19 MED ORDER — SCOPOLAMINE 1 MG/3DAYS TD PT72
1.0000 | MEDICATED_PATCH | Freq: Once | TRANSDERMAL | Status: AC
  Administered 2023-01-19: 1 via TRANSDERMAL

## 2023-01-19 MED ORDER — ONDANSETRON HCL 4 MG PO TABS: 4.0000 mg | ORAL_TABLET | Freq: Three times a day (TID) | ORAL | 0 refills | Status: DC | PRN

## 2023-01-19 MED ORDER — HYDROMORPHONE HCL 1 MG/ML IJ SOLN
0.2500 mg | INTRAMUSCULAR | Status: DC | PRN
  Administered 2023-01-19: 0.5 mg via INTRAVENOUS

## 2023-01-19 MED ORDER — GABAPENTIN 300 MG PO CAPS
ORAL_CAPSULE | ORAL | Status: AC
  Filled 2023-01-19: qty 1

## 2023-01-19 MED ORDER — ONDANSETRON HCL 4 MG/2ML IJ SOLN
INTRAMUSCULAR | Status: DC | PRN
  Administered 2023-01-19: 4 mg via INTRAVENOUS

## 2023-01-19 MED ORDER — CHLORHEXIDINE GLUCONATE CLOTH 2 % EX PADS: 6.0000 | MEDICATED_PAD | Freq: Once | CUTANEOUS | Status: DC

## 2023-01-19 MED ORDER — LACTATED RINGERS IV SOLN: INTRAVENOUS | Status: DC

## 2023-01-19 MED ORDER — SCOPOLAMINE 1 MG/3DAYS TD PT72
MEDICATED_PATCH | TRANSDERMAL | Status: AC
  Filled 2023-01-19: qty 1

## 2023-01-19 MED ORDER — LIDOCAINE HCL (CARDIAC) PF 100 MG/5ML IV SOSY
PREFILLED_SYRINGE | INTRAVENOUS | Status: DC | PRN
  Administered 2023-01-19: 80 mg via INTRAVENOUS

## 2023-01-19 MED ORDER — AMISULPRIDE (ANTIEMETIC) 5 MG/2ML IV SOLN: 10.0000 mg | Freq: Once | INTRAVENOUS | Status: DC | PRN

## 2023-01-19 MED ORDER — ACETAMINOPHEN 500 MG PO TABS
1000.0000 mg | ORAL_TABLET | ORAL | Status: AC
  Administered 2023-01-19: 1000 mg via ORAL

## 2023-01-19 MED ORDER — GABAPENTIN 300 MG PO CAPS
300.0000 mg | ORAL_CAPSULE | ORAL | Status: AC
  Administered 2023-01-19: 300 mg via ORAL

## 2023-01-19 MED ORDER — ROCURONIUM BROMIDE 10 MG/ML (PF) SYRINGE
PREFILLED_SYRINGE | INTRAVENOUS | Status: AC
  Filled 2023-01-19: qty 10

## 2023-01-19 MED ORDER — CEFAZOLIN SODIUM-DEXTROSE 2-4 GM/100ML-% IV SOLN
2.0000 g | INTRAVENOUS | Status: AC
  Administered 2023-01-19: 2 g via INTRAVENOUS

## 2023-01-19 MED ORDER — BUPIVACAINE HCL (PF) 0.5 % IJ SOLN
INTRAMUSCULAR | Status: DC | PRN
  Administered 2023-01-19: 30 mL

## 2023-01-19 MED ORDER — ROCURONIUM BROMIDE 100 MG/10ML IV SOLN
INTRAVENOUS | Status: DC | PRN
  Administered 2023-01-19: 60 mg via INTRAVENOUS

## 2023-01-19 MED ORDER — ONDANSETRON HCL 4 MG/2ML IJ SOLN
INTRAMUSCULAR | Status: AC
  Filled 2023-01-19: qty 2

## 2023-01-19 MED ORDER — SUGAMMADEX SODIUM 200 MG/2ML IV SOLN
INTRAVENOUS | Status: DC | PRN
  Administered 2023-01-19: 150 mg via INTRAVENOUS

## 2023-01-19 MED ORDER — DEXAMETHASONE SODIUM PHOSPHATE 10 MG/ML IJ SOLN
INTRAMUSCULAR | Status: DC | PRN
  Administered 2023-01-19: 10 mg via INTRAVENOUS

## 2023-01-19 MED ORDER — HYDROMORPHONE HCL 1 MG/ML IJ SOLN
INTRAMUSCULAR | Status: AC
  Filled 2023-01-19: qty 0.5

## 2023-01-19 MED ORDER — 0.9 % SODIUM CHLORIDE (POUR BTL) OPTIME
TOPICAL | Status: DC | PRN
  Administered 2023-01-19: 500 mL

## 2023-01-19 MED ORDER — CEPHALEXIN 500 MG PO CAPS: 500.0000 mg | ORAL_CAPSULE | Freq: Four times a day (QID) | ORAL | 0 refills | Status: AC

## 2023-01-19 MED ORDER — FENTANYL CITRATE (PF) 100 MCG/2ML IJ SOLN
INTRAMUSCULAR | Status: AC
  Filled 2023-01-19: qty 2

## 2023-01-19 MED ORDER — SODIUM CHLORIDE 0.9 % IV SOLN
INTRAVENOUS | Status: DC | PRN
  Administered 2023-01-19: 500 mL

## 2023-01-19 MED ORDER — ACETAMINOPHEN 500 MG PO TABS
ORAL_TABLET | ORAL | Status: AC
  Filled 2023-01-19: qty 2

## 2023-01-19 MED ORDER — SODIUM CHLORIDE 0.9 % IV SOLN
INTRAVENOUS | Status: AC
  Filled 2023-01-19: qty 10

## 2023-01-19 MED ORDER — PHENYLEPHRINE HCL (PRESSORS) 10 MG/ML IV SOLN
INTRAVENOUS | Status: DC | PRN
  Administered 2023-01-19: 160 ug via INTRAVENOUS
  Administered 2023-01-19: 80 ug via INTRAVENOUS

## 2023-01-19 MED ORDER — EPHEDRINE SULFATE (PRESSORS) 50 MG/ML IJ SOLN
INTRAMUSCULAR | Status: DC | PRN
  Administered 2023-01-19: 5 mg via INTRAVENOUS

## 2023-01-19 MED ORDER — DEXAMETHASONE SODIUM PHOSPHATE 10 MG/ML IJ SOLN
INTRAMUSCULAR | Status: AC
  Filled 2023-01-19: qty 1

## 2023-01-19 MED ORDER — PROPOFOL 10 MG/ML IV BOLUS
INTRAVENOUS | Status: DC | PRN
  Administered 2023-01-19: 150 mg via INTRAVENOUS

## 2023-01-19 MED ORDER — LIDOCAINE 2% (20 MG/ML) 5 ML SYRINGE
INTRAMUSCULAR | Status: AC
  Filled 2023-01-19: qty 5

## 2023-01-19 MED ORDER — PROPOFOL 10 MG/ML IV BOLUS
INTRAVENOUS | Status: AC
  Filled 2023-01-19: qty 20

## 2023-01-19 MED ORDER — BUPIVACAINE HCL (PF) 0.5 % IJ SOLN
INTRAMUSCULAR | Status: AC
  Filled 2023-01-19: qty 30

## 2023-01-19 MED ORDER — CELECOXIB 200 MG PO CAPS
200.0000 mg | ORAL_CAPSULE | ORAL | Status: AC
  Administered 2023-01-19: 200 mg via ORAL

## 2023-01-19 MED ORDER — CEFAZOLIN SODIUM-DEXTROSE 2-4 GM/100ML-% IV SOLN
INTRAVENOUS | Status: AC
  Filled 2023-01-19: qty 100

## 2023-01-19 SURGICAL SUPPLY — 92 items
ADH SKN CLS APL DERMABOND .7 (GAUZE/BANDAGES/DRESSINGS) ×4
APL PRP STRL LF DISP 70% ISPRP (MISCELLANEOUS) ×4
BAG DECANTER FOR FLEXI CONT (MISCELLANEOUS) ×2 IMPLANT
BINDER BREAST LRG (GAUZE/BANDAGES/DRESSINGS) IMPLANT
BINDER BREAST MEDIUM (GAUZE/BANDAGES/DRESSINGS) IMPLANT
BINDER BREAST XLRG (GAUZE/BANDAGES/DRESSINGS) IMPLANT
BINDER BREAST XXLRG (GAUZE/BANDAGES/DRESSINGS) IMPLANT
BLADE SURG 10 STRL SS (BLADE) ×2 IMPLANT
BLADE SURG 15 STRL LF DISP TIS (BLADE) ×2 IMPLANT
BLADE SURG 15 STRL SS (BLADE)
BNDG CMPR 5X62 HK CLSR LF (GAUZE/BANDAGES/DRESSINGS)
BNDG CMPR 6"X 5 YARDS HK CLSR (GAUZE/BANDAGES/DRESSINGS)
BNDG ELASTIC 6INX 5YD STR LF (GAUZE/BANDAGES/DRESSINGS) IMPLANT
BNDG GAUZE DERMACEA FLUFF 4 (GAUZE/BANDAGES/DRESSINGS) ×4 IMPLANT
BNDG GZE DERMACEA 4 6PLY (GAUZE/BANDAGES/DRESSINGS) ×4
CANISTER SUCT 1200ML W/VALVE (MISCELLANEOUS) ×2 IMPLANT
CHLORAPREP W/TINT 26 (MISCELLANEOUS) ×4 IMPLANT
COVER BACK TABLE 60X90IN (DRAPES) ×2 IMPLANT
COVER MAYO STAND STRL (DRAPES) ×2 IMPLANT
COVER SURGICAL LIGHT HANDLE (MISCELLANEOUS) IMPLANT
DERMABOND ADVANCED .7 DNX12 (GAUZE/BANDAGES/DRESSINGS) ×4 IMPLANT
DRAIN CHANNEL 15F RND FF W/TCR (WOUND CARE) IMPLANT
DRAPE TOP ARMCOVERS (MISCELLANEOUS) ×2 IMPLANT
DRAPE U-SHAPE 76X120 STRL (DRAPES) ×2 IMPLANT
DRAPE UTILITY XL STRL (DRAPES) ×2 IMPLANT
DRSG TEGADERM 2-3/8X2-3/4 SM (GAUZE/BANDAGES/DRESSINGS) IMPLANT
ELECT BLADE 4.0 EZ CLEAN MEGAD (MISCELLANEOUS) ×2
ELECT BLADE 6.5 EXT (BLADE) IMPLANT
ELECT COATED BLADE 2.86 ST (ELECTRODE) ×2 IMPLANT
ELECT REM PT RETURN 9FT ADLT (ELECTROSURGICAL) ×2
ELECTRODE BLDE 4.0 EZ CLN MEGD (MISCELLANEOUS) ×2 IMPLANT
ELECTRODE REM PT RTRN 9FT ADLT (ELECTROSURGICAL) ×2 IMPLANT
EVACUATOR SILICONE 100CC (DRAIN) IMPLANT
GAUZE PAD ABD 8X10 STRL (GAUZE/BANDAGES/DRESSINGS) ×4 IMPLANT
GLOVE BIO SURGEON STRL SZ 6 (GLOVE) ×4 IMPLANT
GLOVE BIOGEL PI IND STRL 7.5 (GLOVE) IMPLANT
GLOVE BIOGEL PI IND STRL 8.5 (GLOVE) IMPLANT
GLOVE SS BIOGEL STRL SZ 8 (GLOVE) IMPLANT
GOWN STRL REUS W/ TWL LRG LVL3 (GOWN DISPOSABLE) ×4 IMPLANT
GOWN STRL REUS W/ TWL XL LVL3 (GOWN DISPOSABLE) IMPLANT
GOWN STRL REUS W/TWL LRG LVL3 (GOWN DISPOSABLE)
GOWN STRL REUS W/TWL XL LVL3 (GOWN DISPOSABLE) ×2
IMPL BREAST P4.2XMDRT 345 (Breast) IMPLANT
IMPL BREAST P6.2XRND LO 525 (Breast) IMPLANT
IMPL BRST P4.2XMDRT 345CC (Breast) ×2 IMPLANT
IMPL BRST P6.2XRND LO 525CC (Breast) ×2 IMPLANT
IMPLANT BREAST GEL 345CC (Breast) ×2 IMPLANT
IMPLANT BREAST GEL 525CC (Breast) ×2 IMPLANT
IV NS 1000ML (IV SOLUTION)
IV NS 1000ML BAXH (IV SOLUTION) IMPLANT
IV NS 500ML (IV SOLUTION) ×2
IV NS 500ML BAXH (IV SOLUTION) ×2 IMPLANT
KIT FILL ASEPTIC TRANSFER (MISCELLANEOUS) IMPLANT
MARKER SKIN DUAL TIP RULER LAB (MISCELLANEOUS) IMPLANT
NDL FILTER BLUNT 18X1 1/2 (NEEDLE) IMPLANT
NDL HYPO 25X1 1.5 SAFETY (NEEDLE) ×2 IMPLANT
NEEDLE FILTER BLUNT 18X1 1/2 (NEEDLE) IMPLANT
NEEDLE HYPO 25X1 1.5 SAFETY (NEEDLE) ×2 IMPLANT
NS IRRIG 1000ML POUR BTL (IV SOLUTION) ×2 IMPLANT
PACK BASIN DAY SURGERY FS (CUSTOM PROCEDURE TRAY) ×2 IMPLANT
PENCIL SMOKE EVACUATOR (MISCELLANEOUS) ×2 IMPLANT
PIN SAFETY STERILE (MISCELLANEOUS) IMPLANT
SHEET MEDIUM DRAPE 40X70 STRL (DRAPES) ×2 IMPLANT
SIZER BREAST 330CC (SIZER) ×2
SIZER BREAST 345CC (SIZER) IMPLANT
SIZER BREAST 525CC (SIZER) ×2
SIZER BRST 330CC (SIZER) IMPLANT
SIZER BRST 525CC (SIZER) IMPLANT
SLEEVE SCD COMPRESS KNEE MED (STOCKING) ×2 IMPLANT
SPIKE FLUID TRANSFER (MISCELLANEOUS) ×2 IMPLANT
SPONGE T-LAP 18X18 ~~LOC~~+RFID (SPONGE) ×4 IMPLANT
STAPLER VISISTAT 35W (STAPLE) ×2 IMPLANT
STRIP CLOSURE SKIN 1/2X4 (GAUZE/BANDAGES/DRESSINGS) ×2 IMPLANT
SUT ETHILON 2 0 FS 18 (SUTURE) IMPLANT
SUT MNCRL AB 4-0 PS2 18 (SUTURE) ×2 IMPLANT
SUT PDS AB 2-0 CT2 27 (SUTURE) IMPLANT
SUT SILK 2 0 SH (SUTURE) IMPLANT
SUT VIC AB 3-0 PS1 18 (SUTURE)
SUT VIC AB 3-0 PS1 18XBRD (SUTURE) IMPLANT
SUT VIC AB 3-0 SH 27 (SUTURE) ×2
SUT VIC AB 3-0 SH 27X BRD (SUTURE) ×2 IMPLANT
SUT VIC AB 4-0 PS2 18 (SUTURE) ×2 IMPLANT
SUT VICRYL 0 CT-2 (SUTURE) IMPLANT
SUT VLOC 180 0 24IN GS25 (SUTURE) IMPLANT
SYR 20ML LL LF (SYRINGE) IMPLANT
SYR 50ML LL SCALE MARK (SYRINGE) ×2 IMPLANT
SYR BULB IRRIG 60ML STRL (SYRINGE) ×2 IMPLANT
SYR CONTROL 10ML LL (SYRINGE) ×2 IMPLANT
TOWEL GREEN STERILE FF (TOWEL DISPOSABLE) ×4 IMPLANT
TUBE CONNECTING 20X1/4 (TUBING) ×4 IMPLANT
UNDERPAD 30X36 HEAVY ABSORB (UNDERPADS AND DIAPERS) ×4 IMPLANT
YANKAUER SUCT BULB TIP NO VENT (SUCTIONS) ×2 IMPLANT

## 2023-01-19 NOTE — Anesthesia Postprocedure Evaluation (Signed)
Anesthesia Post Note  Patient: Autumn Wyatt  Procedure(s) Performed: REMOVAL OF TISSUE EXPANDER AND PLACEMENT OF IMPLANT (Left: Chest) MAMMOPLASTY AUGMENTATION (BREAST) (Right: Chest)     Patient location during evaluation: PACU Anesthesia Type: General Level of consciousness: awake and alert Pain management: pain level controlled Vital Signs Assessment: post-procedure vital signs reviewed and stable Respiratory status: spontaneous breathing, nonlabored ventilation and respiratory function stable Cardiovascular status: blood pressure returned to baseline Postop Assessment: no apparent nausea or vomiting Anesthetic complications: no   No notable events documented.  Last Vitals:  Vitals:   01/19/23 0930 01/19/23 0945  BP: (!) 140/95 (!) 145/88  Pulse: 67 64  Resp: 15 16  Temp:    SpO2: 98% 99%    Last Pain:  Vitals:   01/19/23 0945  TempSrc:   PainSc: 5                  Shanda Howells

## 2023-01-19 NOTE — Anesthesia Procedure Notes (Signed)
Procedure Name: Intubation Date/Time: 01/19/2023 7:29 AM  Performed by: Thornell Mule, CRNAPre-anesthesia Checklist: Patient identified, Emergency Drugs available, Suction available and Patient being monitored Patient Re-evaluated:Patient Re-evaluated prior to induction Oxygen Delivery Method: Circle system utilized Preoxygenation: Pre-oxygenation with 100% oxygen Induction Type: IV induction Ventilation: Mask ventilation without difficulty Laryngoscope Size: Miller and 3 Grade View: Grade I Tube type: Oral Tube size: 7.0 mm Number of attempts: 1 Airway Equipment and Method: Stylet and Oral airway Placement Confirmation: ETT inserted through vocal cords under direct vision, positive ETCO2 and breath sounds checked- equal and bilateral Secured at: 20 cm Tube secured with: Tape Dental Injury: Teeth and Oropharynx as per pre-operative assessment

## 2023-01-19 NOTE — Anesthesia Preprocedure Evaluation (Addendum)
Anesthesia Evaluation  Patient identified by MRN, date of birth, ID band Patient awake    Reviewed: Allergy & Precautions, NPO status , Patient's Chart, lab work & pertinent test results  History of Anesthesia Complications Negative for: history of anesthetic complications  Airway Mallampati: II  TM Distance: >3 FB Neck ROM: Full    Dental no notable dental hx.    Pulmonary neg pulmonary ROS   Pulmonary exam normal        Cardiovascular Normal cardiovascular exam+ dysrhythmias (s/p ablation) Supra Ventricular Tachycardia      Neuro/Psych    GI/Hepatic negative GI ROS, Neg liver ROS,,,  Endo/Other  negative endocrine ROS    Renal/GU negative Renal ROS     Musculoskeletal negative musculoskeletal ROS (+)    Abdominal   Peds  Hematology negative hematology ROS (+)   Anesthesia Other Findings hx breast ca, acquired absence breast, hx therapeutic radiation  Reproductive/Obstetrics                             Anesthesia Physical Anesthesia Plan  ASA: 2  Anesthesia Plan: General   Post-op Pain Management: Tylenol PO (pre-op)* and Celebrex PO (pre-op)*   Induction: Intravenous  PONV Risk Score and Plan: 3 and Treatment may vary due to age or medical condition, Ondansetron, Dexamethasone and Scopolamine patch - Pre-op  Airway Management Planned: Oral ETT  Additional Equipment: None  Intra-op Plan:   Post-operative Plan: Extubation in OR  Informed Consent: I have reviewed the patients History and Physical, chart, labs and discussed the procedure including the risks, benefits and alternatives for the proposed anesthesia with the patient or authorized representative who has indicated his/her understanding and acceptance.     Dental advisory given  Plan Discussed with: CRNA  Anesthesia Plan Comments:         Anesthesia Quick Evaluation

## 2023-01-19 NOTE — Op Note (Signed)
Operative Note   DATE OF OPERATION: 6.7.2024  LOCATION: [facility]-outpatient  SURGICAL DIVISION: Plastic Surgery  PREOPERATIVE DIAGNOSES:  1. History breast cancer 2. Acquired absence breast 3. History therapeutic radiation  POSTOPERATIVE DIAGNOSES:  same  PROCEDURE:  1. Removal left chest tissue expander and placement silicone implant 2. Right breast augmentation with silicone implant  SURGEON: Glenna Fellows MD MBA  ASSISTANT: none  ANESTHESIA:  General.   EBL: 20 ml  COMPLICATIONS: None immediate.   INDICATIONS FOR PROCEDURE:  The patient, Autumn Wyatt, is a 57 y.o. female born on 1966/05/10, is here for staged breast reconstruction following left mastectomy with prepectoral tissue expander reconstruction.   FINDINGS: Complete incorporation ADM noted. Natrelle smooth round Soft Touch implants placed bilateral. RIGHT Moderate Profile 345 ml REF [id number] [id number] LEFT Extra Projection 525 ml REF [id number]  DESCRIPTION OF PROCEDURE:  The patient's operative site was marked with the patient in the preoperative area. The patient was taken to the operating room. SCDs were placed and IV antibiotics were given. The patient's operative site was prepped and draped in a sterile fashion. A time out was performed and all information was confirmed to be correct. Incision made in left inframammary fold scar and carried through superficial fascia and acellular dermis. Expander removed. Capsulotomies performed near circumferentially. Local anesthetic infiltrated. Sizer placed. I then directed my attention to right breast. Incision made in inframammary fold and carried through superficial fascia to  pectoralis surface. Subfascial dissection completed to anticipated dimensions of implant. Local anesthetic infiltrated. Sizer placed. Patient brought to upright sitting position and assessed for symmetry. A 525 ml Extra projection implant selected for left chest and a 345 ml  Moderate projection implant selected for right chest. Patient returned to supine position. Local anesthetic infiltrated over right chest. Each cavity irrigated with saline solution containing Ancef, gentamicin, and bacitracin. Hemostasis ensured. Interrupted 2-0 PDS suture placed from superficial fascia caudal to incision to chest wall to stabilize inframammary fold. Implant placed in right chest. Orientation implant ensured. Closure completed with 3-0 vicryl in superficial fascia, 4-0 vicryl in dermis, and 4-0 monocryl subcuticular skin closure. Over left chest, implant placed, and orientation implant ensured. Closure completed with 3-0 vicryl in superficial fascia, 4-0 vicryl in dermis, and 4-0 monocryl subcuticular skin closure. Dermabond applied followed by dry dressing, breast binder.  The patient was allowed to wake from anesthesia, extubated and taken to the recovery room in satisfactory condition.   SPECIMENS: none  DRAINS: none  Glenna Fellows, MD [facility] Plastic & Reconstructive Surgery  Office/ physician access line after hours [phone]

## 2023-01-19 NOTE — Discharge Instructions (Signed)
  Post Anesthesia Home Care Instructions  Activity: Get plenty of rest for the remainder of the day. A responsible individual must stay with you for 24 hours following the procedure.  For the next 24 hours, DO NOT: -Drive a car -Advertising copywriter -Drink alcoholic beverages -Take any medication unless instructed by your physician -Make any legal decisions or sign important papers.  Meals: Start with liquid foods such as gelatin or soup. Progress to regular foods as tolerated. Avoid greasy, spicy, heavy foods. If nausea and/or vomiting occur, drink only clear liquids until the nausea and/or vomiting subsides. Call your physician if vomiting continues.  Special Instructions/Symptoms: Your throat may feel dry or sore from the anesthesia or the breathing tube placed in your throat during surgery. If this causes discomfort, gargle with warm salt water. The discomfort should disappear within 24 hours.  If you had a scopolamine patch placed behind your ear for the management of post- operative nausea and/or vomiting:  1. The medication in the patch is effective for 72 hours, after which it should be removed.  Wrap patch in a tissue and discard in the trash. Wash hands thoroughly with soap and water. 2. You may remove the patch earlier than 72 hours if you experience unpleasant side effects which may include dry mouth, dizziness or visual disturbances. 3. Avoid touching the patch. Wash your hands with soap and water after contact with the patch.  No tylenol or ibuprofen until after 12:30pm today if needed.

## 2023-01-19 NOTE — Transfer of Care (Signed)
Immediate Anesthesia Transfer of Care Note  Patient: KELLEEN STOLZE  Procedure(s) Performed: REMOVAL OF TISSUE EXPANDER AND PLACEMENT OF IMPLANT (Left: Chest) MAMMOPLASTY AUGMENTATION (BREAST) (Right: Chest)  Patient Location: PACU  Anesthesia Type:General  Level of Consciousness: drowsy, patient cooperative, and responds to stimulation  Airway & Oxygen Therapy: Patient Spontanous Breathing and Patient connected to face mask oxygen  Post-op Assessment: Report given to RN and Post -op Vital signs reviewed and stable  Post vital signs: Reviewed and stable  Last Vitals:  Vitals Value Taken Time  BP    Temp    Pulse 70 01/19/23 0921  Resp    SpO2 100 % 01/19/23 0921  Vitals shown include unvalidated device data.  Last Pain:  Vitals:   01/19/23 0634  TempSrc: Oral  PainSc: 0-No pain      Patients Stated Pain Goal: 7 (01/19/23 2956)  Complications: No notable events documented.

## 2023-01-19 NOTE — Interval H&P Note (Signed)
History and Physical Interval Note:  01/19/2023 6:53 AM  Shon Hough  has presented today for surgery, with the diagnosis of hx breast ca, acquired absence breast, hx therapeutic radiation.  The various methods of treatment have been discussed with the patient and family. After consideration of risks, benefits and other options for treatment, the patient has consented to  Procedure(s): REMOVAL OF TISSUE EXPANDER AND PLACEMENT OF IMPLANT (Left) MAMMOPLASTY AUGMENTATION (BREAST) (Right)silicone implants as a surgical intervention.  The patient's history has been reviewed, patient examined, no change in status, stable for surgery.  I have reviewed the patient's chart and labs.  Questions were answered to the patient's satisfaction.     Irean Hong Arvle Grabe

## 2023-01-20 ENCOUNTER — Other Ambulatory Visit: Payer: Self-pay

## 2023-01-22 ENCOUNTER — Encounter (HOSPITAL_BASED_OUTPATIENT_CLINIC_OR_DEPARTMENT_OTHER): Payer: Self-pay | Admitting: Plastic Surgery

## 2023-01-31 ENCOUNTER — Other Ambulatory Visit: Payer: Self-pay | Admitting: *Deleted

## 2023-01-31 ENCOUNTER — Encounter: Payer: Self-pay | Admitting: Hematology

## 2023-01-31 ENCOUNTER — Telehealth: Payer: Self-pay | Admitting: Hematology

## 2023-01-31 MED ORDER — EXEMESTANE 25 MG PO TABS: 25.0000 mg | ORAL_TABLET | Freq: Every day | ORAL | 0 refills | Status: DC

## 2023-01-31 NOTE — Telephone Encounter (Signed)
Per staff message patient need appointment rescheduled, called twice, also left a message regarding changed appointment time/dates.

## 2023-02-19 ENCOUNTER — Ambulatory Visit: Payer: BC Managed Care – PPO | Attending: Hematology

## 2023-02-19 VITALS — Wt 151.2 lb

## 2023-02-19 DIAGNOSIS — Z483 Aftercare following surgery for neoplasm: Secondary | ICD-10-CM | POA: Insufficient documentation

## 2023-02-19 NOTE — Therapy (Signed)
OUTPATIENT PHYSICAL THERAPY SOZO SCREENING NOTE   Patient Name: Autumn Wyatt MRN: [id number] DOB:[birth year], 57 y.o., female Today's Date: 02/19/2023  PCP: Silverio Lay, MD REFERRING PROVIDER: Malachy Mood, MD   PT End of Session - 02/19/23 [phone]     Visit Number 1   # unchanged due to screen only   PT Start Time 0821    PT Stop Time 0825    PT Time Calculation (min) 4 min    Activity Tolerance Patient tolerated treatment well    Behavior During Therapy [facility] for tasks assessed/performed             Past Medical History:  Diagnosis Date   Abnormal pap    Allergy    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, LCIS   DIAPHORESIS 12/27/2009   Qualifier: Diagnosis of  By: Kem Parkinson     Hypertension    Palpitations 12/27/2009   no current problems, Qualifier: Diagnosis of  By: Kem Parkinson   SUPRAVENTRICULAR TACHYCARDIA 05/21/2020   had ablation - Qualifier: Diagnosis of  By: Kem Parkinson   Past Surgical History:  Procedure Laterality Date   BREAST BIOPSY Left 12/14/2021   BREAST ENHANCEMENT SURGERY Right 01/19/2023   Procedure: MAMMOPLASTY AUGMENTATION (BREAST);  Surgeon: Glenna Fellows, MD;  Location: [facility];  Service: Plastics;  Laterality: Right;   BREAST RECONSTRUCTION WITH PLACEMENT OF TISSUE EXPANDER AND ALLODERM Left 01/31/2022   Procedure: BREAST RECONSTRUCTION WITH PLACEMENT OF TISSUE EXPANDER AND ALLODERM;  Surgeon: Glenna Fellows, MD;  Location: [facility];  Service: Plastics;  Laterality: Left;   CARPAL TUNNEL WITH CUBITAL TUNNEL Bilateral    EYE SURGERY  2006   INCISION AND DRAINAGE OF WOUND Left 02/18/2022   Procedure: IRRIGATION AND DEBRIDEMENT WOUND-LEFT BREAST;  Surgeon: Glenna Fellows, MD;  Location: [facility];  Service: Plastics;  Laterality: Left;   KNEE SURGERY     MASTECTOMY W/ SENTINEL NODE BIOPSY Left 01/31/2022   Procedure: LEFT NIPPLE SPARING MASTECTOMY AND SENTINEL LYMPH NODE  BIOPSY;  Surgeon: Abigail Miyamoto, MD;  Location: [facility];  Service: General;  Laterality: Left;   REFRACTIVE SURGERY     REMOVAL OF TISSUE EXPANDER AND PLACEMENT OF IMPLANT Left 01/19/2023   Procedure: REMOVAL OF TISSUE EXPANDER AND PLACEMENT OF IMPLANT;  Surgeon: Glenna Fellows, MD;  Location: [facility];  Service: Plastics;  Laterality: Left;   SVT ABLATION N/A 05/21/2020   Procedure: SVT ABLATION;  Surgeon: Lanier Prude, MD;  Location: [facility];  Service: Cardiovascular;  Laterality: N/A;   TISSUE EXPANDER PLACEMENT Left 03/24/2022   Procedure: TISSUE EXPANDER - REMOVAL AND REPLACEMENT;  Surgeon: Glenna Fellows, MD;  Location: [facility];  Service: Plastics;  Laterality: Left;   TRIGGER FINGER RELEASE Right    TRIGGER FINGER RELEASE Left 01/15/2023   at [facility]   WISDOM TOOTH EXTRACTION     Patient Active Problem List   Diagnosis Date Noted   Osteopenia after menopause 12/14/2022   Breast cancer, left breast (HCC) 01/31/2022   Malignant neoplasm of upper-outer quadrant of left breast in female, estrogen receptor positive (HCC) 12/26/2021   SVT (supraventricular tachycardia) (HCC) 12/27/2009   DIAPHORESIS 12/27/2009   PALPITATIONS 12/27/2009   SHORTNESS OF BREATH 12/27/2009    REFERRING DIAG: left breast cancer at risk for lymphedema  THERAPY DIAG: Aftercare following surgery for neoplasm  PERTINENT HISTORY: Pt had a left nipple sparing mastectomy with expander placement on 02/08/22  with 1 negative LN removed. 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 Radiation completed. On the letrozole.  Will have switch to implant on 01/19/23.   PRECAUTIONS: left UE Lymphedema risk, None  SUBJECTIVE: Pt returns for her 3 month L-Dex screen.   PAIN:  Are you having pain? No  SOZO SCREENING: Patient was assessed today using the SOZO machine to determine the lymphedema index score. This was compared  to her baseline score. It was determined that she is within the recommended range when compared to her baseline and no further action is needed at this time. She will continue SOZO screenings. These are done every 3 months for 2 years post operatively followed by every 6 months for 2 years, and then annually.   L-DEX FLOWSHEETS - 02/19/23 0800       L-DEX LYMPHEDEMA SCREENING   Measurement Type Unilateral    L-DEX MEASUREMENT EXTREMITY Upper Extremity    POSITION  Standing    DOMINANT SIDE Right    At Risk Side Left    BASELINE SCORE (UNILATERAL) 0.1    L-DEX SCORE (UNILATERAL) -2.3    VALUE CHANGE (UNILAT) -2.4               Hermenia Bers, PTA 02/19/2023, 8:24 AM

## 2023-03-09 ENCOUNTER — Telehealth: Payer: Self-pay

## 2023-03-09 NOTE — Telephone Encounter (Signed)
Confirmation of successful fax for signed DME order.

## 2023-03-15 ENCOUNTER — Ambulatory Visit: Payer: BC Managed Care – PPO | Admitting: Hematology

## 2023-03-15 ENCOUNTER — Other Ambulatory Visit: Payer: BC Managed Care – PPO

## 2023-03-28 ENCOUNTER — Inpatient Hospital Stay (HOSPITAL_BASED_OUTPATIENT_CLINIC_OR_DEPARTMENT_OTHER): Payer: BC Managed Care – PPO | Admitting: Hematology

## 2023-03-28 ENCOUNTER — Other Ambulatory Visit: Payer: Self-pay

## 2023-03-28 ENCOUNTER — Encounter: Payer: Self-pay | Admitting: Hematology

## 2023-03-28 ENCOUNTER — Inpatient Hospital Stay: Payer: BC Managed Care – PPO | Attending: Hematology

## 2023-03-28 VITALS — BP 135/76 | HR 78 | Temp 98.9°F | Resp 18 | Ht <= 58 in | Wt 153.2 lb

## 2023-03-28 DIAGNOSIS — Z9012 Acquired absence of left breast and nipple: Secondary | ICD-10-CM | POA: Diagnosis not present

## 2023-03-28 DIAGNOSIS — M8580 Other specified disorders of bone density and structure, unspecified site: Secondary | ICD-10-CM

## 2023-03-28 DIAGNOSIS — Z780 Asymptomatic menopausal state: Secondary | ICD-10-CM

## 2023-03-28 DIAGNOSIS — C50412 Malignant neoplasm of upper-outer quadrant of left female breast: Secondary | ICD-10-CM

## 2023-03-28 DIAGNOSIS — Z170 Estrogen receptor positive status [ER+]: Secondary | ICD-10-CM | POA: Diagnosis not present

## 2023-03-28 DIAGNOSIS — Z79811 Long term (current) use of aromatase inhibitors: Secondary | ICD-10-CM | POA: Insufficient documentation

## 2023-03-28 LAB — CBC WITH DIFFERENTIAL (CANCER CENTER ONLY)
Abs Immature Granulocytes: 0.02 10*3/uL (ref 0.00–0.07)
Basophils Absolute: 0.1 10*3/uL (ref 0.0–0.1)
Basophils Relative: 1 %
Eosinophils Absolute: 0.1 10*3/uL (ref 0.0–0.5)
Eosinophils Relative: 2 %
HCT: 43.9 % (ref 36.0–46.0)
Hemoglobin: 15.3 g/dL — ABNORMAL HIGH (ref 12.0–15.0)
Immature Granulocytes: 0 %
Lymphocytes Relative: 21 %
Lymphs Abs: 1.1 10*3/uL (ref 0.7–4.0)
MCH: 33.3 pg (ref 26.0–34.0)
MCHC: 34.9 g/dL (ref 30.0–36.0)
MCV: 95.4 fL (ref 80.0–100.0)
Monocytes Absolute: 0.5 10*3/uL (ref 0.1–1.0)
Monocytes Relative: 10 %
Neutro Abs: 3.5 10*3/uL (ref 1.7–7.7)
Neutrophils Relative %: 66 %
Platelet Count: 185 10*3/uL (ref 150–400)
RBC: 4.6 MIL/uL (ref 3.87–5.11)
RDW: 12.3 % (ref 11.5–15.5)
WBC Count: 5.3 10*3/uL (ref 4.0–10.5)
nRBC: 0 % (ref 0.0–0.2)

## 2023-03-28 LAB — CMP (CANCER CENTER ONLY)
ALT: 28 U/L (ref 0–44)
AST: 20 U/L (ref 15–41)
Albumin: 4.3 g/dL (ref 3.5–5.0)
Alkaline Phosphatase: 70 U/L (ref 38–126)
Anion gap: 5 (ref 5–15)
BUN: 15 mg/dL (ref 6–20)
CO2: 29 mmol/L (ref 22–32)
Calcium: 9.7 mg/dL (ref 8.9–10.3)
Chloride: 106 mmol/L (ref 98–111)
Creatinine: 0.49 mg/dL (ref 0.44–1.00)
GFR, Estimated: >60 mL/min (ref >60)
Glucose, Bld: 101 mg/dL — ABNORMAL HIGH (ref 70–99)
Potassium: 4 mmol/L (ref 3.5–5.1)
Sodium: 140 mmol/L (ref 135–145)
Total Bilirubin: 0.6 mg/dL (ref 0.3–1.2)
Total Protein: 7.5 g/dL (ref 6.5–8.1)

## 2023-03-28 LAB — VITAMIN D 25 HYDROXY (VIT D DEFICIENCY, FRACTURES): Vit D, 25-Hydroxy: 62.14 ng/mL (ref 30–100)

## 2023-03-28 MED ORDER — EXEMESTANE 25 MG PO TABS: 25.0000 mg | ORAL_TABLET | Freq: Every day | ORAL | 1 refills | Status: DC

## 2023-03-28 NOTE — Progress Notes (Signed)
[facility]   Telephone:([phone]) [phone] Fax:(336) [phone]   Clinic Follow up Note   Patient Care Team: Silverio Lay, MD as PCP - General (Obstetrics and Gynecology) Lanier Prude, MD as PCP - Electrophysiology (Cardiology) Abigail Miyamoto, MD as Consulting Physician (General Surgery) Malachy Mood, MD as Consulting Physician (Hematology) Dorothy Puffer, MD as Consulting Physician (Radiation Oncology)  Date of Service:  03/28/2023  CHIEF COMPLAINT: f/u of  left breast cancer   CURRENT THERAPY:  Exemestane started 02/04/2023  ASSESSMENT:  Autumn Wyatt is a 58 y.o. 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 S/p left mastectomy on 01/31/22 by Dr. Magnus Ivan, path showed: 6.1 cm invasive lobular carcinoma with LCIS. Margins and lymph node were negative. -Oncotype RS 10, low risk -she has completed adjuvant post-mastectomy radiation under Dr. Mitzi Hansen on 06/15/22. -She tried adjuvant letrozole, could not tolerate due to arthralgia.  She was switched to exemestane in July 2024. She is tolerating well so far.  -patient and her husband had a multiple questions about cancer surveillance, she has a PET scan, and how to detect early recurrence.  I answered all to their satisfaction.  Osteopenia after menopause -DEXA on 10/16/22 showed T-1.7 at AP, consistent with osteopenia.  No high risk fracture. -We discussed AI can decrease bone density. -I encouraged her to take calcium and vitamin D    PLAN: -I recommend staying very active -Continue Exemestane, refilled  -She has started a calcium and vitamin D supplement -repeat Bone Density q2 years -lab and f/u in 6 months    SUMMARY OF ONCOLOGIC HISTORY: Oncology History Overview Note   Cancer Staging  Malignant neoplasm of upper-outer  quadrant of left breast in female, estrogen receptor positive (HCC) Staging form: Breast, AJCC 8th Edition - Clinical: Stage IA (cT1c, cN0, cM0, G2, ER+, PR+, HER2-) - Unsigned Stage prefix: Initial diagnosis Method of lymph node assessment: Clinical Histologic grading system: 3 grade system - Pathologic stage from 02/20/2022: Stage IA (pT3, pN0(sn), cM0, G1, ER+, PR+, HER2-, Oncotype DX score: 10) - Signed by Ronny Bacon, PA-C on 02/20/2022 Stage prefix: Initial diagnosis Method of lymph node assessment: Sentinel lymph node biopsy Multigene prognostic tests performed: Oncotype DX Recurrence score range: Less than 11 Histologic grading system: 3 grade system     Malignant neoplasm of upper-outer quadrant of left breast in female, estrogen receptor positive (HCC)  12/13/2021 Mammogram   CLINICAL DATA:  57 year old female for further evaluation of possible LEFT breast distortion on screening mammogram.   EXAM: DIGITAL DIAGNOSTIC UNILATERAL LEFT MAMMOGRAM WITH TOMOSYNTHESIS AND CAD; ULTRASOUND LEFT BREAST LIMITED  IMPRESSION: 1. Highly suspicious 1.1 cm UPPER-OUTER LEFT breast mass. Tissue sampling is recommended. 2. No abnormal appearing LEFT axillary lymph nodes.   12/14/2021 Initial Biopsy   Diagnosis Breast, left, needle core biopsy, 2:00 5cmfn - INVASIVE MAMMARY CARCINOMA, GRADE 1/2. - MAMMARY CARCINOMA IN SITU. - SEE NOTE. Diagnosis Note The greatest tumor dimension is 0.9 cm.  Addendum: Immunohistochemistry for E-cadherin is negative consistent with lobular carcinoma.  PROGNOSTIC INDICATORS Results: The tumor cells are EQUIVOCAL for Her2 (2+). Her2 by FISH will be performed and the results reported separately. Estrogen Receptor: 70%, POSITIVE, STRONG STAINING INTENSITY Progesterone Receptor: 90%, POSITIVE, STRONG STAINING INTENSITY Proliferation Marker Ki67: 1%  FLUORESCENCE IN-SITU HYBRIDIZATION Results: GROUP 5: HER2 **NEGATIVE**   01/31/2022 Surgery  left  mastectomy by Dr. Magnus Ivan, path showed: 6.1 cm invasive lobular carcinoma with LCIS. Margins and lymph node were negative.    01/31/2022 Oncotype testing   10/3%, <1% absolute chemotherapy benefit   02/20/2022 Cancer Staging   Staging form: Breast, AJCC 8th Edition - Pathologic stage from 02/20/2022: Stage IA (pT3, pN0(sn), cM0, G1, ER+, PR+, HER2-, Oncotype DX score: 10) - Signed by Ronny Bacon, PA-C on 02/20/2022 Stage prefix: Initial diagnosis Method of lymph node assessment: Sentinel lymph node biopsy Multigene prognostic tests performed: Oncotype DX Recurrence score range: Less than 11 Histologic grading system: 3 grade system   05/01/2022 - 06/15/2022 Radiation Therapy   Site Technique Total Dose (Gy) Dose per Fx (Gy) Completed Fx Beam Energies  Chest Wall, Left: CW_L 3D 50.4/50.4 1.8 28/28 10XFFF  Chest Wall, Left: CW_L_SCLV 3D 50.4/50.4 1.8 28/28 6X, 10X  Chest Wall, Left: CW_L_Bst specialPort 10/10 2 5/5 6E     06/2022 -  Anti-estrogen oral therapy   Letrozole      INTERVAL HISTORY:  Autumn Wyatt is here for a follow up of  left breast cancer . She was last seen by me on 12/14/2022. She presents to the clinic accompanied by husband. Pt state that she started the Exemestane 6/23. She report that she notice fewer hot flashes, and no joint pain at this present time.     All other systems were reviewed with the patient and are negative.  MEDICAL HISTORY:  Past Medical History:  Diagnosis Date   Abnormal pap    Allergy    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, LCIS   DIAPHORESIS 12/27/2009   Qualifier: Diagnosis of  By: Kem Parkinson     Hypertension    Palpitations 12/27/2009   no current problems, Qualifier: Diagnosis of  By: Kem Parkinson   SUPRAVENTRICULAR TACHYCARDIA 05/21/2020   had ablation - Qualifier: Diagnosis of  By: Kem Parkinson    SURGICAL HISTORY: Past Surgical History:  Procedure Laterality Date   BREAST BIOPSY  Left 12/14/2021   BREAST ENHANCEMENT SURGERY Right 01/19/2023   Procedure: MAMMOPLASTY AUGMENTATION (BREAST);  Surgeon: Glenna Fellows, MD;  Location: [facility];  Service: Plastics;  Laterality: Right;   BREAST RECONSTRUCTION WITH PLACEMENT OF TISSUE EXPANDER AND ALLODERM Left 01/31/2022   Procedure: BREAST RECONSTRUCTION WITH PLACEMENT OF TISSUE EXPANDER AND ALLODERM;  Surgeon: Glenna Fellows, MD;  Location: [facility];  Service: Plastics;  Laterality: Left;   CARPAL TUNNEL WITH CUBITAL TUNNEL Bilateral    EYE SURGERY  2006   INCISION AND DRAINAGE OF WOUND Left 02/18/2022   Procedure: IRRIGATION AND DEBRIDEMENT WOUND-LEFT BREAST;  Surgeon: Glenna Fellows, MD;  [facility]: [facility];  Service: Plastics;  Laterality: Left;   KNEE SURGERY     MASTECTOMY W/ SENTINEL NODE BIOPSY Left 01/31/2022   Procedure: LEFT NIPPLE SPARING MASTECTOMY AND SENTINEL LYMPH NODE BIOPSY;  Surgeon: Abigail Miyamoto, MD;  Location: [facility];  Service: General;  Laterality: Left;   REFRACTIVE SURGERY     REMOVAL OF TISSUE EXPANDER AND PLACEMENT OF IMPLANT Left 01/19/2023   Procedure: REMOVAL OF TISSUE EXPANDER AND PLACEMENT OF IMPLANT;  Surgeon: Glenna Fellows, MD;  Location: [facility];  Service: Plastics;  Laterality: Left;   SVT ABLATION N/A 05/21/2020   Procedure: SVT ABLATION;  Surgeon: Lanier Prude, MD;  Location: [facility];  Service: Cardiovascular;  Laterality: N/A;   TISSUE EXPANDER PLACEMENT Left 03/24/2022   Procedure: TISSUE  EXPANDER - REMOVAL AND REPLACEMENT;  Surgeon: Glenna Fellows, MD;  Location: [facility];  Service: Plastics;  Laterality: Left;   TRIGGER FINGER RELEASE Right    TRIGGER FINGER RELEASE Left 01/15/2023   at [facility]   WISDOM TOOTH EXTRACTION      I have reviewed the social history and family history with the patient and they are unchanged from previous note.  ALLERGIES:  is  allergic to codeine, demerol [meperidine], oxycodone, tape, and versed [midazolam].  MEDICATIONS:  Current Outpatient Medications  Medication Sig Dispense Refill   acetaminophen (TYLENOL) 500 MG tablet Take 500-1,000 mg by mouth every 6 (six) hours as needed (pain.).     ergocalciferol (VITAMIN D2) 1.25 MG (50000 UT) capsule Take 1 capsule (50,000 Units total) by mouth once a week. 12 capsule 0   exemestane (AROMASIN) 25 MG tablet Take 1 tablet (25 mg total) by mouth daily after breakfast. 90 tablet 1   ibuprofen (ADVIL,MOTRIN) 600 MG tablet Take 400-600 mg by mouth every 6 (six) hours as needed for headache.     ondansetron (ZOFRAN) 4 MG tablet Take 1 tablet (4 mg total) by mouth every 8 (eight) hours as needed for nausea or vomiting. 10 tablet 0   No current facility-administered medications for this visit.    PHYSICAL EXAMINATION: ECOG PERFORMANCE STATUS: 0 - Asymptomatic  Vitals:   03/28/23 1117  BP: 135/76  Pulse: 78  Resp: 18  Temp: 98.9 F (37.2 C)  SpO2: 99%   Wt Readings from Last 3 Encounters:  03/28/23 153 lb 3.2 oz (69.5 kg)  02/19/23 151 lb 4 oz (68.6 kg)  01/19/23 149 lb 11.1 oz (67.9 kg)     GENERAL:alert, no distress and comfortable SKIN: skin color normal, no rashes or significant lesions EYES: normal, Conjunctiva are pink and non-injected, sclera clear  NEURO: alert & oriented x 3 with fluent speech  LABORATORY DATA:  I have reviewed the data as listed    Latest Ref Rng & Units 03/28/2023   10:55 AM 12/14/2022   10:34 AM 02/18/2022    6:18 AM  CBC  WBC 4.0 - 10.5 K/uL 5.3  4.4  6.4   Hemoglobin 12.0 - 15.0 g/dL 78.2  95.6  21.3   Hematocrit 36.0 - 46.0 % 43.9  44.0  44.3   Platelets 150 - 400 K/uL 185  179  256         Latest Ref Rng & Units 03/28/2023   10:55 AM 12/14/2022   10:34 AM 02/18/2022    6:18 AM  CMP  Glucose 70 - 99 mg/dL 086  578  469   BUN 6 - 20 mg/dL 15  16  15    Creatinine 0.44 - 1.00 mg/dL 6.29  5.28  4.13   Sodium 135 - 145  mmol/L 140  142  140   Potassium 3.5 - 5.1 mmol/L 4.0  4.0  4.0   Chloride 98 - 111 mmol/L 106  107  103   CO2 22 - 32 mmol/L 29  30  21    Calcium 8.9 - 10.3 mg/dL 9.7  9.7  24.4   Total Protein 6.5 - 8.1 g/dL 7.5  7.6    Total Bilirubin 0.3 - 1.2 mg/dL 0.6  0.6    Alkaline Phos 38 - 126 U/L 70  75    AST 15 - 41 U/L 20  21    ALT 0 - 44 U/L 28  24  RADIOGRAPHIC STUDIES: I have personally reviewed the radiological images as listed and agreed with the findings in the report. No results found.    Orders Placed This Encounter  Procedures   CA 27.29    Standing Status:   Standing    Number of Occurrences:   20    Standing Expiration Date:   03/27/2024   All questions were answered. The patient knows to call the clinic with any problems, questions or concerns. No barriers to learning was detected. The total time spent in the appointment was 25 minutes.     Malachy Mood, MD 03/28/2023   Carolin Coy, CMA, am acting as scribe for Malachy Mood, MD.   I have reviewed the above documentation for accuracy and completeness, and I agree with the above.

## 2023-03-28 NOTE — Assessment & Plan Note (Signed)
-  DEXA on 10/16/22 showed T-1.7 at AP, consistent with osteopenia.  No high risk fracture. -We discussed AI can decrease bone density. -I encouraged her to take calcium and vitamin D

## 2023-03-28 NOTE — Assessment & Plan Note (Signed)
invasive lobular carcinoma, stage IA, p(T3, N0), ER+/PR+/HER2-, Grade 1/2, (+)  LCIS -found on screening mammogram. S/p left mastectomy on 01/31/22 by Dr. Magnus Ivan, path showed: 6.1 cm invasive lobular carcinoma with LCIS. Margins and lymph node were negative. -Oncotype RS 10, low risk -she has completed adjuvant post-mastectomy radiation under Dr. Mitzi Hansen on 06/15/22. -She tried adjuvant letrozole, could not tolerate due to arthralgia.  She was switched to exemestane in July 2024.

## 2023-03-29 LAB — CANCER ANTIGEN 27.29: CA 27.29: 22.1 U/mL (ref 0.0–38.6)

## 2023-05-28 ENCOUNTER — Ambulatory Visit: Payer: BC Managed Care – PPO | Attending: Hematology

## 2023-05-28 VITALS — Wt 153.1 lb

## 2023-05-28 DIAGNOSIS — Z483 Aftercare following surgery for neoplasm: Secondary | ICD-10-CM | POA: Insufficient documentation

## 2023-05-28 NOTE — Therapy (Signed)
OUTPATIENT PHYSICAL THERAPY SOZO SCREENING NOTE   Patient Name: Autumn Wyatt MRN: [id number] DOB:Mar 31, 1966, 57 y.o., female Today's Date: 05/28/2023  PCP: Silverio Lay, MD REFERRING PROVIDER: Malachy Mood, MD   PT End of Session - 05/28/23 [phone]     Visit Number 1   # unchanged due to screen only   PT Start Time 0830    PT Stop Time 0834    PT Time Calculation (min) 4 min    Activity Tolerance Patient tolerated treatment well    Behavior During Therapy [facility] for tasks assessed/performed             Past Medical History:  Diagnosis Date   Abnormal pap    Allergy    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, LCIS   DIAPHORESIS 12/27/2009   Qualifier: Diagnosis of  By: Kem Parkinson     Hypertension    Palpitations 12/27/2009   no current problems, Qualifier: Diagnosis of  By: Kem Parkinson   SUPRAVENTRICULAR TACHYCARDIA 05/21/2020   had ablation - Qualifier: Diagnosis of  By: Kem Parkinson   Past Surgical History:  Procedure Laterality Date   BREAST BIOPSY Left 12/14/2021   BREAST ENHANCEMENT SURGERY Right 01/19/2023   Procedure: MAMMOPLASTY AUGMENTATION (BREAST);  Surgeon: Glenna Fellows, MD;  Location: [facility];  Service: Plastics;  Laterality: Right;   BREAST RECONSTRUCTION WITH PLACEMENT OF TISSUE EXPANDER AND ALLODERM Left 01/31/2022   Procedure: BREAST RECONSTRUCTION WITH PLACEMENT OF TISSUE EXPANDER AND ALLODERM;  Surgeon: Glenna Fellows, MD;  Location: [facility];  Service: Plastics;  Laterality: Left;   CARPAL TUNNEL WITH CUBITAL TUNNEL Bilateral    EYE SURGERY  2006   INCISION AND DRAINAGE OF WOUND Left 02/18/2022   Procedure: IRRIGATION AND DEBRIDEMENT WOUND-LEFT BREAST;  Surgeon: Glenna Fellows, MD;  Location: [facility];  Service: Plastics;  Laterality: Left;   KNEE SURGERY     MASTECTOMY W/ SENTINEL NODE BIOPSY Left 01/31/2022   Procedure: LEFT NIPPLE SPARING MASTECTOMY AND SENTINEL LYMPH NODE  BIOPSY;  Surgeon: Abigail Miyamoto, MD;  Location: [facility];  Service: General;  Laterality: Left;   REFRACTIVE SURGERY     REMOVAL OF TISSUE EXPANDER AND PLACEMENT OF IMPLANT Left 01/19/2023   Procedure: REMOVAL OF TISSUE EXPANDER AND PLACEMENT OF IMPLANT;  Surgeon: Glenna Fellows, MD;  Location: [facility];  Service: Plastics;  Laterality: Left;   SVT ABLATION N/A 05/21/2020   Procedure: SVT ABLATION;  Surgeon: Lanier Prude, MD;  Location: [facility];  Service: Cardiovascular;  Laterality: N/A;   TISSUE EXPANDER PLACEMENT Left 03/24/2022   Procedure: TISSUE EXPANDER - REMOVAL AND REPLACEMENT;  Surgeon: Glenna Fellows, MD;  Location:  SUR[facility]ERY CENTER;  Service: Plastics;  Laterality: Left;   TRIGGER FINGER RELEASE Right    TRIGGER FINGER RELEASE Left 01/15/2023   at [facility]   WISDOM TOOTH EXTRACTION     Patient Active Problem List   Diagnosis Date Noted   Osteopenia after menopause 12/14/2022   Breast cancer, left breast (HCC) 01/31/2022   Malignant neoplasm of upper-outer quadrant of left breast in female, estrogen receptor positive (HCC) 12/26/2021   SVT (supraventricular tachycardia) (HCC) 12/27/2009   DIAPHORESIS 12/27/2009   PALPITATIONS 12/27/2009   SHORTNESS OF BREATH 12/27/2009    REFERRING DIAG: left breast cancer at risk for lymphedema  THERAPY DIAG: Aftercare following surgery for neoplasm  PERTINENT HISTORY: Pt had a left nipple sparing mastectomy with expander placement on 02/08/22  with 1 negative LN removed. 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 Radiation completed. On the letrozole.  Will have switch to implant on 01/19/23.   PRECAUTIONS: left UE Lymphedema risk, None  SUBJECTIVE: Pt returns for her 3 month L-Dex screen.   PAIN:  Are you having pain? No  SOZO SCREENING: Patient was assessed today using the SOZO machine to determine the lymphedema index score. This was compared  to her baseline score. It was determined that she is within the recommended range when compared to her baseline and no further action is needed at this time. She will continue SOZO screenings. These are done every 3 months for 2 years post operatively followed by every 6 months for 2 years, and then annually.   L-DEX FLOWSHEETS - 05/28/23 0800       L-DEX LYMPHEDEMA SCREENING   Measurement Type Unilateral    L-DEX MEASUREMENT EXTREMITY Upper Extremity    POSITION  Standing    DOMINANT SIDE Right    At Risk Side Left    BASELINE SCORE (UNILATERAL) 0.1    L-DEX SCORE (UNILATERAL) 0.9    VALUE CHANGE (UNILAT) 0.8               Hermenia Bers, PTA 05/28/2023, 8:33 AM

## 2023-07-25 ENCOUNTER — Other Ambulatory Visit: Payer: Self-pay | Admitting: Hematology

## 2023-08-27 ENCOUNTER — Ambulatory Visit: Payer: BC Managed Care – PPO | Attending: Hematology

## 2023-08-27 VITALS — Wt 151.2 lb

## 2023-08-27 DIAGNOSIS — Z483 Aftercare following surgery for neoplasm: Secondary | ICD-10-CM | POA: Insufficient documentation

## 2023-08-27 NOTE — Therapy (Signed)
 OUTPATIENT PHYSICAL THERAPY SOZO SCREENING NOTE   Patient Name: Autumn Wyatt MRN: [id number] DOB:06-12-1966, 58 y.o., female Today's Date: 08/27/2023  PCP: Darcel Pool, MD REFERRING PROVIDER: Lanny Callander, MD   PT End of Session - 08/27/23 [phone]     Visit Number 1   # unchanged due to screen only   PT Start Time 0852    PT Stop Time 0856    PT Time Calculation (min) 4 min    Activity Tolerance Patient tolerated treatment well    Behavior During Therapy [facility] for tasks assessed/performed             Past Medical History:  Diagnosis Date   Abnormal pap    Allergy    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, LCIS   DIAPHORESIS 12/27/2009   Qualifier: Diagnosis of  By: Gwenn Grimes     Hypertension    Palpitations 12/27/2009   no current problems, Qualifier: Diagnosis of  By: Barnes, [employer]   SUPRAVENTRICULAR TACHYCARDIA 05/21/2020   had ablation - Qualifier: Diagnosis of  By: Gwenn Grimes   Past Surgical History:  Procedure Laterality Date   BREAST BIOPSY Left 12/14/2021   BREAST ENHANCEMENT SURGERY Right 01/19/2023   Procedure: MAMMOPLASTY AUGMENTATION (BREAST);  Surgeon: Arelia Filippo, MD;  Location: [facility];  Service: Plastics;  Laterality: Right;   BREAST RECONSTRUCTION WITH PLACEMENT OF TISSUE EXPANDER AND ALLODERM Left 01/31/2022   Procedure: BREAST RECONSTRUCTION WITH PLACEMENT OF TISSUE EXPANDER AND ALLODERM;  Surgeon: Arelia Filippo, MD;  Location: [facility];  Service: Plastics;  Laterality: Left;   CARPAL TUNNEL WITH CUBITAL TUNNEL Bilateral    EYE SURGERY  2006   INCISION AND DRAINAGE OF WOUND Left 02/18/2022   Procedure: IRRIGATION AND DEBRIDEMENT WOUND-LEFT BREAST;  Surgeon: Arelia Filippo, MD;  Location: [facility];  Service: Plastics;  Laterality: Left;   KNEE SURGERY     MASTECTOMY W/ SENTINEL NODE BIOPSY Left 01/31/2022   Procedure: LEFT NIPPLE SPARING MASTECTOMY AND SENTINEL LYMPH NODE  BIOPSY;  Surgeon: Vernetta Berg, MD;  Location: [facility];  Service: General;  Laterality: Left;   REFRACTIVE SURGERY     REMOVAL OF TISSUE EXPANDER AND PLACEMENT OF IMPLANT Left 01/19/2023   Procedure: REMOVAL OF TISSUE EXPANDER AND PLACEMENT OF IMPLANT;  Surgeon: Arelia Filippo, MD;  Location: [facility];  Service: Plastics;  Laterality: Left;   SVT ABLATION N/A 05/21/2020   Procedure: SVT ABLATION;  Surgeon: Cindie Ole DASEN, MD;  Location: [facility];  Service: Cardiovascular;  Laterality: N/A;   TISSUE EXPANDER PLACEMENT Left 03/24/2022   Procedure: TISSUE EXPANDER - REMOVAL AND REPLACEMENT;  Surgeon: Arelia Filippo, MD;  Location: [facility];  Service: Plastics;  Laterality: Left;   TRIGGER FINGER RELEASE Right    TRIGGER FINGER RELEASE Left 01/15/2023   at [facility]   WISDOM TOOTH EXTRACTION     Patient Active Problem List   Diagnosis Date Noted   Osteopenia after menopause 12/14/2022   Breast cancer, left breast (HCC) 01/31/2022   Malignant neoplasm of upper-outer quadrant of left breast in female, estrogen receptor positive (HCC) 12/26/2021   SVT (supraventricular tachycardia) (HCC) 12/27/2009   DIAPHORESIS 12/27/2009   PALPITATIONS 12/27/2009   SHORTNESS OF BREATH 12/27/2009    REFERRING DIAG: left breast cancer at risk for lymphedema  THERAPY DIAG: Aftercare following surgery for neoplasm  PERTINENT HISTORY: Pt had a left nipple sparing mastectomy with expander placement on 02/08/22  with 1 negative LN removed. 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 Radiation completed. On the letrozole .  Will have switch to implant on 01/19/23.   PRECAUTIONS: left UE Lymphedema risk, None  SUBJECTIVE: Pt returns for her 3 month L-Dex screen.   PAIN:  Are you having pain? No  SOZO SCREENING: Patient was assessed today using the SOZO machine to determine the lymphedema index score. This was compared  to her baseline score. It was determined that she is within the recommended range when compared to her baseline and no further action is needed at this time. She will continue SOZO screenings. These are done every 3 months for 2 years post operatively followed by every 6 months for 2 years, and then annually.   L-DEX FLOWSHEETS - 08/27/23 0800       L-DEX LYMPHEDEMA SCREENING   Measurement Type Unilateral    L-DEX MEASUREMENT EXTREMITY Upper Extremity    POSITION  Standing    DOMINANT SIDE Right    At Risk Side Left    BASELINE SCORE (UNILATERAL) 0.1    L-DEX SCORE (UNILATERAL) -1.4    VALUE CHANGE (UNILAT) -1.5               Aden Berwyn Caldron, PTA 08/27/2023, 8:56 AM

## 2023-09-19 ENCOUNTER — Encounter: Payer: Self-pay | Admitting: Hematology

## 2023-09-19 ENCOUNTER — Other Ambulatory Visit: Payer: Self-pay

## 2023-09-19 DIAGNOSIS — C50412 Malignant neoplasm of upper-outer quadrant of left female breast: Secondary | ICD-10-CM

## 2023-09-25 NOTE — Progress Notes (Unsigned)
Patient Care Team: Silverio Lay, MD as PCP - General (Obstetrics and Gynecology) Lanier Prude, MD as PCP - Electrophysiology (Cardiology) Abigail Miyamoto, MD as Consulting Physician (General Surgery) Malachy Mood, MD as Consulting Physician (Hematology) Dorothy Puffer, MD as Consulting Physician (Radiation Oncology) Diagnostic Radiology & Imaging, [facility] as Radiologist (Diagnostic Radiology)  Clinic Day:  09/26/2023  Referring physician: Silverio Lay, MD  ASSESSMENT & PLAN:   Assessment & Plan: Malignant neoplasm of upper-outer quadrant of left breast in female, estrogen receptor positive (HCC) invasive lobular carcinoma, stage IA, p(T3, N0), ER+/PR+/HER2-, Grade 1/2, (+)  LCIS -found on screening mammogram. S/p left mastectomy on 01/31/22 by Dr. Magnus Ivan, path showed: 6.1 cm invasive lobular carcinoma with LCIS. Margins and lymph node were negative. -Oncotype RS 10, low risk -she has completed adjuvant post-mastectomy radiation under Dr. Mitzi Hansen on 06/15/22. -She tried adjuvant letrozole, could not tolerate due to arthralgia.  She was switched to exemestane in July 2024. -Has been tolerating exemestane well.  Next screening mammogram due April 2025.   Plan:  Labs reviewed. Mildly elevated and stable hgb and glucose.  Breast MRI ordered due to left breast/chest wall tenderness after fall, landing onto breast area which contains implant. There is moderate concern for rupture or leak of implant.  Will call to discuss with patient once results are available. Labs and follow-up to be scheduled in 6 months.  The patient understands the plans discussed today and is in agreement with them.  She knows to contact our office if she develops concerns prior to her next appointment.  I provided 25 minutes of face-to-face time during this encounter and > 50% was spent counseling as documented under my assessment and plan.    Carlean Jews, NP  [facility]  ONC - A DEPT OF [facility] [address] [place] [address] Dept: [phone] Dept Fax: [phone]   Orders Placed This Encounter  Procedures   MR BREAST BILATERAL W WO CONTRAST INC CAD    Standing Status:   Future    Expected Date:   10/03/2023    Expiration Date:   09/25/2024    If indicated for the ordered procedure, I authorize the administration of contrast media per Radiology protocol:   Yes    What is the patient's sedation requirement?:   No Sedation    Does the patient have a pacemaker or implanted devices?:   No    Radiology Contrast Protocol - do NOT remove file path:   [email or web address]    Preferred imaging location?:   [facility] (table limit-550lbs)      CHIEF COMPLAINT:  CC: Left breast cancer, estrogen receptor positive  Current Treatment: Exemestane started 02/04/2023  INTERVAL HISTORY:  Gae is here today for repeat clinical assessment.  She was last seen by Dr. Mosetta Putt on 03/28/2023.  Tolerating exemestane well overall.  She will be due for screening mammogram in April 2025. States that she had a fall on August 15, 2023. She tripped over a box in her home, and fell onto her left breast implant. Since then, she has had chest wall pain. It is difficult for her to move arm above head.  The is pain and pulling across the left upper chest wall and around the center of the left chest wall/breast area. She has noted some redness and bruising in the area of the upper left breast as well. She denies fevers or chills. She denies pain.  Her appetite is good. Her weight has increased 4 pounds over last 6 months .  I have reviewed the past medical history, past surgical history, social history and family history with the patient and they are unchanged from previous note.  ALLERGIES:  is allergic to codeine, demerol [meperidine], oxycodone, tape, and versed [midazolam].  MEDICATIONS:  Current Outpatient  Medications  Medication Sig Dispense Refill   acetaminophen (TYLENOL) 500 MG tablet Take 500-1,000 mg by mouth every 6 (six) hours as needed (pain.).     Calcium Carb-Cholecalciferol (CALCIUM 600+D3 PO) Take by mouth.     exemestane (AROMASIN) 25 MG tablet TAKE ONE TABLET BY MOUTH DAILY AFTER BREAKFAST 90 tablet 1   ibuprofen (ADVIL,MOTRIN) 600 MG tablet Take 400-600 mg by mouth every 6 (six) hours as needed for headache.     ondansetron (ZOFRAN) 4 MG tablet Take 1 tablet (4 mg total) by mouth every 8 (eight) hours as needed for nausea or vomiting. 10 tablet 0   No current facility-administered medications for this visit.    HISTORY OF PRESENT ILLNESS:   Oncology History Overview Note   Cancer Staging  Malignant neoplasm of upper-outer quadrant of left breast in female, estrogen receptor positive (HCC) Staging form: Breast, AJCC 8th Edition - Clinical: Stage IA (cT1c, cN0, cM0, G2, ER+, PR+, HER2-) - Unsigned Stage prefix: Initial diagnosis Method of lymph node assessment: Clinical Histologic grading system: 3 grade system - Pathologic stage from 02/20/2022: Stage IA (pT3, pN0(sn), cM0, G1, ER+, PR+, HER2-, Oncotype DX score: 10) - Signed by Ronny Bacon, PA-C on 02/20/2022 Stage prefix: Initial diagnosis Method of lymph node assessment: Sentinel lymph node biopsy Multigene prognostic tests performed: Oncotype DX Recurrence score range: Less than 11 Histologic grading system: 3 grade system     Malignant neoplasm of upper-outer quadrant of left breast in female, estrogen receptor positive (HCC)  12/13/2021 Mammogram   CLINICAL DATA:  58 year old female for further evaluation of possible LEFT breast distortion on screening mammogram.   EXAM: DIGITAL DIAGNOSTIC UNILATERAL LEFT MAMMOGRAM WITH TOMOSYNTHESIS AND CAD; ULTRASOUND LEFT BREAST LIMITED  IMPRESSION: 1. Highly suspicious 1.1 cm UPPER-OUTER LEFT breast mass. Tissue sampling is recommended. 2. No abnormal appearing  LEFT axillary lymph nodes.   12/14/2021 Initial Biopsy   Diagnosis Breast, left, needle core biopsy, 2:00 5cmfn - INVASIVE MAMMARY CARCINOMA, GRADE 1/2. - MAMMARY CARCINOMA IN SITU. - SEE NOTE. Diagnosis Note The greatest tumor dimension is 0.9 cm.  Addendum: Immunohistochemistry for E-cadherin is negative consistent with lobular carcinoma.  PROGNOSTIC INDICATORS Results: The tumor cells are EQUIVOCAL for Her2 (2+). Her2 by FISH will be performed and the results reported separately. Estrogen Receptor: 70%, POSITIVE, STRONG STAINING INTENSITY Progesterone Receptor: 90%, POSITIVE, STRONG STAINING INTENSITY Proliferation Marker Ki67: 1%  FLUORESCENCE IN-SITU HYBRIDIZATION Results: GROUP 5: HER2 **NEGATIVE**   01/31/2022 Surgery   left mastectomy by Dr. Magnus Ivan, path showed: 6.1 cm invasive lobular carcinoma with LCIS. Margins and lymph node were negative.    01/31/2022 Oncotype testing   10/3%, <1% absolute chemotherapy benefit   02/20/2022 Cancer Staging   Staging form: Breast, AJCC 8th Edition - Pathologic stage from 02/20/2022: Stage IA (pT3, pN0(sn), cM0, G1, ER+, PR+, HER2-, Oncotype DX score: 10) - Signed by Ronny Bacon, PA-C on 02/20/2022 Stage prefix: Initial diagnosis Method of lymph node assessment: Sentinel lymph node biopsy Multigene prognostic tests performed: Oncotype DX Recurrence score range: Less than 11 Histologic grading system: 3 grade system   05/01/2022 - 06/15/2022 Radiation Therapy   Site  Technique Total Dose (Gy) Dose per Fx (Gy) Completed Fx Beam Energies  Chest Wall, Left: CW_L 3D 50.4/50.4 1.8 28/28 10XFFF  Chest Wall, Left: CW_L_SCLV 3D 50.4/50.4 1.8 28/28 6X, 10X  Chest Wall, Left: CW_L_Bst specialPort 10/10 2 5/5 6E     06/2022 - 02/02/2023 Anti-estrogen oral therapy   Letrozole. Stopped due to severe joint pain    02/04/2023 -  Anti-estrogen oral therapy   exemestane       REVIEW OF SYSTEMS:   Constitutional: Denies fevers, chills  or abnormal weight loss Eyes: Denies blurriness of vision Ears, nose, mouth, throat, and face: Denies mucositis or sore throat Respiratory: Denies cough, dyspnea or wheezes Cardiovascular: Denies palpitation, chest discomfort or lower extremity swelling.  Chest wall discomfort after fall August 15, 2023. Gastrointestinal:  Denies nausea, heartburn or change in bowel habits Skin: Denies abnormal skin rashes Lymphatics: Denies new lymphadenopathy or easy bruising Neurological:Denies numbness, tingling or new weaknesses Behavioral/Psych: Mood is stable, no new changes  All other systems were reviewed with the patient and are negative.   VITALS:   Today's Vitals   09/26/23 1032 09/26/23 1049  BP: (!) 139/97 (!) 142/80  Pulse: 98   Resp: 17   Temp: 98 F (36.7 C)   TempSrc: Temporal   SpO2: 100%   Weight: 157 lb 7 oz (71.4 kg)    Body mass index is 32.9 kg/m.   Wt Readings from Last 3 Encounters:  09/26/23 157 lb 7 oz (71.4 kg)  08/27/23 151 lb 4 oz (68.6 kg)  05/28/23 153 lb 2 oz (69.5 kg)    Body mass index is 32.9 kg/m.  Performance status (ECOG): 1 - Symptomatic but completely ambulatory  PHYSICAL EXAM:   GENERAL:alert, no distress and comfortable SKIN: skin color, texture, turgor are normal, no rashes or significant lesions EYES: normal, Conjunctiva are pink and non-injected, sclera clear OROPHARYNX:no exudate, no erythema and lips, buccal mucosa, and tongue normal  NECK: supple, thyroid normal size, non-tender, without nodularity LYMPH:  no palpable lymphadenopathy in the cervical, axillary or inguinal LUNGS: clear to auscultation and percussion with normal breathing effort HEART: regular rate & rhythm and no murmurs and no lower extremity edema ABDOMEN:abdomen soft, non-tender and normal bowel sounds Musculoskeletal:no cyanosis of digits and no clubbing  NEURO: alert & oriented x 3 with fluent speech, no focal motor/sensory deficits BREAST: Right breast has no  palpable lumps or masses.  There is no nipple inversion or evidence of discharge.  There is no axillary lymphadenopathy on the right.  The left breast surgical implant.  Tenderness across the upper left and right quadrants of the left breast.  Slight redness present without warmth.  Implant feels intact.  No palpable masses or lumps in chest wall.  Nipple is surgically absent.  No axillary lymphadenopathy on the left.  LABORATORY DATA:  I have reviewed the data as listed    Component Value Date/Time   NA 140 09/26/2023 1016   NA 138 05/19/2020 1045   K 4.0 09/26/2023 1016   CL 107 09/26/2023 1016   CO2 27 09/26/2023 1016   GLUCOSE 177 (H) 09/26/2023 1016   BUN 18 09/26/2023 1016   BUN 11 05/19/2020 1045   CREATININE 0.60 09/26/2023 1016   CALCIUM 9.7 09/26/2023 1016   PROT 7.8 09/26/2023 1016   ALBUMIN 4.5 09/26/2023 1016   AST 22 09/26/2023 1016   ALT 31 09/26/2023 1016   ALKPHOS 64 09/26/2023 1016   BILITOT 0.8 09/26/2023 1016   GFRNONAA >  60 09/26/2023 1016   GFRAA 124 05/19/2020 1045    Lab Results  Component Value Date   WBC 5.4 09/26/2023   NEUTROABS 3.0 09/26/2023   HGB 15.6 (H) 09/26/2023   HCT 45.4 09/26/2023   MCV 96.4 09/26/2023   PLT 194 09/26/2023

## 2023-09-25 NOTE — Assessment & Plan Note (Signed)
invasive lobular carcinoma, stage IA, p(T3, N0), ER+/PR+/HER2-, Grade 1/2, (+)  LCIS -found on screening mammogram. S/p left mastectomy on 01/31/22 by Dr. Magnus Ivan, path showed: 6.1 cm invasive lobular carcinoma with LCIS. Margins and lymph node were negative. -Oncotype RS 10, low risk -she has completed adjuvant post-mastectomy radiation under Dr. Mitzi Hansen on 06/15/22. -She tried adjuvant letrozole, could not tolerate due to arthralgia.  She was switched to exemestane in July 2024. -Has been tolerating exemestane well.  Next screening mammogram due April 2025.

## 2023-09-26 ENCOUNTER — Inpatient Hospital Stay: Payer: BC Managed Care – PPO | Attending: Nurse Practitioner

## 2023-09-26 ENCOUNTER — Inpatient Hospital Stay (HOSPITAL_BASED_OUTPATIENT_CLINIC_OR_DEPARTMENT_OTHER): Payer: BC Managed Care – PPO | Admitting: Nurse Practitioner

## 2023-09-26 ENCOUNTER — Other Ambulatory Visit: Payer: Self-pay

## 2023-09-26 ENCOUNTER — Encounter: Payer: Self-pay | Admitting: Nurse Practitioner

## 2023-09-26 VITALS — BP 142/80 | HR 98 | Temp 98.0°F | Resp 17 | Wt 157.4 lb

## 2023-09-26 DIAGNOSIS — Z170 Estrogen receptor positive status [ER+]: Secondary | ICD-10-CM | POA: Diagnosis present

## 2023-09-26 DIAGNOSIS — Z79811 Long term (current) use of aromatase inhibitors: Secondary | ICD-10-CM | POA: Insufficient documentation

## 2023-09-26 DIAGNOSIS — C50412 Malignant neoplasm of upper-outer quadrant of left female breast: Secondary | ICD-10-CM | POA: Insufficient documentation

## 2023-09-26 LAB — CBC WITH DIFFERENTIAL (CANCER CENTER ONLY)
Abs Immature Granulocytes: 0.02 10*3/uL (ref 0.00–0.07)
Basophils Absolute: 0 10*3/uL (ref 0.0–0.1)
Basophils Relative: 1 %
Eosinophils Absolute: 0.1 10*3/uL (ref 0.0–0.5)
Eosinophils Relative: 2 %
HCT: 45.4 % (ref 36.0–46.0)
Hemoglobin: 15.6 g/dL — ABNORMAL HIGH (ref 12.0–15.0)
Immature Granulocytes: 0 %
Lymphocytes Relative: 32 %
Lymphs Abs: 1.7 10*3/uL (ref 0.7–4.0)
MCH: 33.1 pg (ref 26.0–34.0)
MCHC: 34.4 g/dL (ref 30.0–36.0)
MCV: 96.4 fL (ref 80.0–100.0)
Monocytes Absolute: 0.5 10*3/uL (ref 0.1–1.0)
Monocytes Relative: 9 %
Neutro Abs: 3 10*3/uL (ref 1.7–7.7)
Neutrophils Relative %: 56 %
Platelet Count: 194 10*3/uL (ref 150–400)
RBC: 4.71 MIL/uL (ref 3.87–5.11)
RDW: 12.7 % (ref 11.5–15.5)
WBC Count: 5.4 10*3/uL (ref 4.0–10.5)
nRBC: 0 % (ref 0.0–0.2)

## 2023-09-26 LAB — CMP (CANCER CENTER ONLY)
ALT: 31 U/L (ref 0–44)
AST: 22 U/L (ref 15–41)
Albumin: 4.5 g/dL (ref 3.5–5.0)
Alkaline Phosphatase: 64 U/L (ref 38–126)
Anion gap: 6 (ref 5–15)
BUN: 18 mg/dL (ref 6–20)
CO2: 27 mmol/L (ref 22–32)
Calcium: 9.7 mg/dL (ref 8.9–10.3)
Chloride: 107 mmol/L (ref 98–111)
Creatinine: 0.6 mg/dL (ref 0.44–1.00)
GFR, Estimated: >60 mL/min (ref >60)
Glucose, Bld: 177 mg/dL — ABNORMAL HIGH (ref 70–99)
Potassium: 4 mmol/L (ref 3.5–5.1)
Sodium: 140 mmol/L (ref 135–145)
Total Bilirubin: 0.8 mg/dL (ref 0.0–1.2)
Total Protein: 7.8 g/dL (ref 6.5–8.1)

## 2023-09-26 NOTE — Progress Notes (Signed)
Successfully faxed bilateral breast MRI order to [facility]. Requested [facility] to contact patient to schedule.

## 2023-09-27 LAB — CANCER ANTIGEN 27.29: CA 27.29: 27.7 U/mL (ref 0.0–38.6)

## 2023-10-03 ENCOUNTER — Ambulatory Visit
Admission: RE | Admit: 2023-10-03 | Discharge: 2023-10-03 | Disposition: A | Discharge status: Home / Self Care | Payer: BC Managed Care – PPO | Source: Ambulatory Visit | Attending: Nurse Practitioner | Admitting: Nurse Practitioner

## 2023-10-03 DIAGNOSIS — C50412 Malignant neoplasm of upper-outer quadrant of left female breast: Secondary | ICD-10-CM

## 2023-10-03 MED ORDER — GADOPICLENOL 0.5 MMOL/ML IV SOLN: 7.0000 mL | Freq: Once | INTRAVENOUS | Status: DC | PRN

## 2023-10-03 MED ORDER — GADOPICLENOL 0.5 MMOL/ML IV SOLN
7.5000 mL | Freq: Once | INTRAVENOUS | Status: AC | PRN
  Administered 2023-10-03: 7.5 mL via INTRAVENOUS

## 2023-10-05 ENCOUNTER — Encounter: Payer: Self-pay | Admitting: Nurse Practitioner

## 2023-10-26 ENCOUNTER — Other Ambulatory Visit: Payer: Self-pay | Admitting: Nurse Practitioner

## 2023-10-26 DIAGNOSIS — C50412 Malignant neoplasm of upper-outer quadrant of left female breast: Secondary | ICD-10-CM

## 2023-10-26 MED ORDER — EXEMESTANE 25 MG PO TABS
25.0000 mg | ORAL_TABLET | Freq: Every day | ORAL | 1 refills | Status: AC
Start: 2023-10-26 — End: ?

## 2023-11-27 ENCOUNTER — Encounter: Payer: Self-pay | Admitting: Hematology

## 2023-11-27 ENCOUNTER — Other Ambulatory Visit: Payer: Self-pay

## 2023-11-27 DIAGNOSIS — C50412 Malignant neoplasm of upper-outer quadrant of left female breast: Secondary | ICD-10-CM

## 2023-11-27 DIAGNOSIS — Z780 Asymptomatic menopausal state: Secondary | ICD-10-CM

## 2023-11-27 NOTE — Progress Notes (Signed)
 Per MyChart message from pt 11/27/2023, pt requested to be referred to Dr. Achille Hole w/[facility] [phone])  Referral packet (faxed) and radiology images sent to [facility].    Fax confirmation received.  Ms. Autumn Wyatt in Radiology sending images to [facility] through [employer] today.

## 2023-12-17 ENCOUNTER — Ambulatory Visit: Payer: BC Managed Care – PPO

## 2023-12-18 ENCOUNTER — Ambulatory Visit: Attending: Hematology | Admitting: Rehabilitation

## 2023-12-18 DIAGNOSIS — Z170 Estrogen receptor positive status [ER+]: Secondary | ICD-10-CM | POA: Insufficient documentation

## 2023-12-18 DIAGNOSIS — Z483 Aftercare following surgery for neoplasm: Secondary | ICD-10-CM | POA: Insufficient documentation

## 2023-12-18 DIAGNOSIS — C50412 Malignant neoplasm of upper-outer quadrant of left female breast: Secondary | ICD-10-CM | POA: Insufficient documentation

## 2023-12-18 NOTE — Therapy (Signed)
 OUTPATIENT PHYSICAL THERAPY SOZO SCREENING NOTE   Patient Name: Autumn Wyatt MRN: [id number] DOB:[birth year], 58 y.o., female Today's Date: 12/18/2023  PCP: Ona Bidding, MD REFERRING PROVIDER: Sonja Gackle, MD   PT End of Session - 12/18/23 1223     Visit Number 1   screen   PT Start Time 0848    PT Stop Time 0853    PT Time Calculation (min) 5 min    Activity Tolerance Patient tolerated treatment well    Behavior During Therapy [facility] for tasks assessed/performed             Past Medical History:  Diagnosis Date   Abnormal pap    Allergy    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, LCIS   DIAPHORESIS 12/27/2009   Qualifier: Diagnosis of  By: Nadean August     Hypertension    Palpitations 12/27/2009   no current problems, Qualifier: Diagnosis of  By: Barnes, Kimalexis   SUPRAVENTRICULAR TACHYCARDIA 05/21/2020   had ablation - Qualifier: Diagnosis of  By: Nadean August   Past Surgical History:  Procedure Laterality Date   BREAST BIOPSY Left 12/14/2021   BREAST ENHANCEMENT SURGERY Right 01/19/2023   Procedure: MAMMOPLASTY AUGMENTATION (BREAST);  Surgeon: Alger Infield, MD;  Location: [facility];  Service: Plastics;  Laterality: Right;   BREAST RECONSTRUCTION WITH PLACEMENT OF TISSUE EXPANDER AND ALLODERM Left 01/31/2022   Procedure: BREAST RECONSTRUCTION WITH PLACEMENT OF TISSUE EXPANDER AND ALLODERM;  Surgeon: Alger Infield, MD;  Location: [facility];  Service: Plastics;  Laterality: Left;   CARPAL TUNNEL WITH CUBITAL TUNNEL Bilateral    EYE SURGERY  2006   INCISION AND DRAINAGE OF WOUND Left 02/18/2022   Procedure: IRRIGATION AND DEBRIDEMENT WOUND-LEFT BREAST;  Surgeon: Alger Infield, MD;  Location: [facility];  Service: Plastics;  Laterality: Left;   KNEE SURGERY     MASTECTOMY W/ SENTINEL NODE BIOPSY Left 01/31/2022   Procedure: LEFT NIPPLE SPARING MASTECTOMY AND SENTINEL LYMPH NODE BIOPSY;  Surgeon: Oza Blumenthal, MD;  Location: [facility];  Service: General;  Laterality: Left;   REFRACTIVE SURGERY     REMOVAL OF TISSUE EXPANDER AND PLACEMENT OF IMPLANT Left 01/19/2023   Procedure: REMOVAL OF TISSUE EXPANDER AND PLACEMENT OF IMPLANT;  Surgeon: Alger Infield, MD;  Location:  SURGERY CENTER;  Service: Plastics;  Laterality: Left;   SVT ABLATION N/A 05/21/2020   Procedure: SVT ABLATION;  Surgeon: Boyce Byes, MD;  Location: [facility];  Service: Cardiovascular;  Laterality: N/A;   TISSUE EXPANDER PLACEMENT Left 03/24/2022   Procedure: TISSUE EXPANDER - REMOVAL AND REPLACEMENT;  Surgeon: Alger Infield, MD;  Location:  SURGERY CENTER;  Service: Plastics;  Laterality: Left;   TRIGGER FINGER RELEASE Right    TRIGGER FINGER RELEASE Left 01/15/2023   at [facility]   WISDOM TOOTH EXTRACTION     Patient Active Problem List   Diagnosis Date Noted   Osteopenia after menopause 12/14/2022   Breast cancer, left breast (HCC) 01/31/2022   Malignant neoplasm of upper-outer quadrant of left breast in female, estrogen receptor positive (HCC) 12/26/2021   SVT (supraventricular tachycardia) (HCC) 12/27/2009   DIAPHORESIS 12/27/2009   PALPITATIONS 12/27/2009   SHORTNESS OF BREATH 12/27/2009    REFERRING DIAG: left breast cancer at risk for lymphedema  THERAPY DIAG: Aftercare following surgery for neoplasm  Malignant neoplasm of upper-outer quadrant of left breast in female, estrogen receptor positive (HCC)  PERTINENT HISTORY: Pt had  a left nipple sparing mastectomy with expander placement on 02/08/22 with 1 negative LN removed. 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 Radiation completed. On the letrozole .  Will have switch to implant on 01/19/23.   PRECAUTIONS: left UE Lymphedema risk, None  SUBJECTIVE: Pt returns for her 3 month L-Dex screen.   PAIN:  Are you having pain? No  SOZO SCREENING: Patient was assessed today using  the SOZO machine to determine the lymphedema index score. This was compared to her baseline score. It was determined that she is within the recommended range when compared to her baseline and no further action is needed at this time. She will continue SOZO screenings. These are done every 3 months for 2 years post operatively followed by every 6 months for 2 years, and then annually.   L-DEX FLOWSHEETS - 12/18/23 1200       L-DEX LYMPHEDEMA SCREENING   Measurement Type Unilateral    L-DEX MEASUREMENT EXTREMITY Upper Extremity    POSITION  Standing    DOMINANT SIDE Right    At Risk Side Left    BASELINE SCORE (UNILATERAL) 0.1    L-DEX SCORE (UNILATERAL) -0.7    VALUE CHANGE (UNILAT) -0.8               Hartman Minahan, Durward Gillis, PT 12/18/2023, 12:25 PM

## 2024-02-20 ENCOUNTER — Other Ambulatory Visit: Payer: Self-pay

## 2024-02-20 ENCOUNTER — Ambulatory Visit: Attending: Obstetrics and Gynecology

## 2024-02-20 DIAGNOSIS — R279 Unspecified lack of coordination: Secondary | ICD-10-CM | POA: Insufficient documentation

## 2024-02-20 DIAGNOSIS — M6281 Muscle weakness (generalized): Secondary | ICD-10-CM | POA: Insufficient documentation

## 2024-02-20 DIAGNOSIS — R293 Abnormal posture: Secondary | ICD-10-CM | POA: Diagnosis present

## 2024-02-20 NOTE — Patient Instructions (Signed)
 The knack: Use this technique while coughing, laughing, sneezing, or with any activities that causes you to leak urine a little. Right before you perform one of these activities that increase pressure in the abdomen and pushes a little urine out, perform a pelvic floor muscle contraction and hold. If that does not completely stop the leaking, try tightening your thighs together in addition to performing a pelvic floor muscle contraction. Make sure you are not trying to stifle a cough, sneeze, or laugh; allow these activities in full as it will cause less pressure down into the bladder and pelvic floor muscles.    Urge Incontinence  Ideal urination frequency is every 2-4 wakeful hours, which equates to 5-8 times within a 24-hour period.   Urge incontinence is leakage that occurs when the bladder muscle contracts, creating a sudden need to go before getting to the bathroom.   Going too often when your bladder isn't actually full can disrupt the body's automatic signals to store and hold urine longer, which will increase urgency/frequency.  In this case, the bladder "is running the show" and strategies can be learned to retrain this pattern.   One should be able to control the first urge to urinate, at around .  The bladder can hold up to a "grande latte," or . To help you gain control, practice the Urge Drill below when urgency strikes.  This drill will help retrain your bladder signals and allow you to store and hold urine longer.  The overall goal is to stretch out your time between voids to reach a more manageable voiding schedule.    Practice your quick flicks often throughout the day (each waking hour) even when you don't need feel the urge to go.  This will help strengthen your pelvic floor muscles, making them more effective in controlling leakage.  Urge Drill  When you feel an urge to go, follow these steps to regain control: Stop what you are doing and be still Take one  deep breath, directing your air into your abdomen Think an affirming thought, such as "I've got this." Do 5 quick flicks of your pelvic floor Walk with control to the bathroom to void, or delay voiding    Vulvar/vaginal Massage: This is a technique to help decrease painful sensitivity in the vaginal area. It can also help to restore normal moisture levels in the vaginal tissues. With coconut oil, aloe, jojoba oil, or a specific vaginal moisturizer, gently massage into vaginal tissues. Think of this as part of your post-shower routine and moisturizing just like you would the rest of the body with lotion. This helps to increase good blood flow to the vaginal tissues. In addition, it also teaches the body that touch to the vagina does not have to be painful or threatening, but moisturizing and gentle.    [facility] [address] [place], [place] [address] Phone # [phone] Fax [phone]

## 2024-02-20 NOTE — Therapy (Signed)
 OUTPATIENT PHYSICAL THERAPY FEMALE PELVIC EVALUATION   Patient Name: Autumn Wyatt MRN: [id number] DOB:[birth year], 58 y.o., female Today's Date: 02/20/2024  END OF SESSION:  PT End of Session - 02/20/24 1107     Visit Number 1    Date for PT Re-Evaluation 08/06/24    Authorization Type BCBS    PT Start Time 1100    PT Stop Time 1140    PT Time Calculation (min) 40 min    Activity Tolerance Patient tolerated treatment well    Behavior During Therapy [facility] for tasks assessed/performed          Past Medical History:  Diagnosis Date   Abnormal pap    Allergy    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, LCIS   DIAPHORESIS 12/27/2009   Qualifier: Diagnosis of  By: Gwenn Grimes     Hypertension    Palpitations 12/27/2009   no current problems, Qualifier: Diagnosis of  By: Barnes, Kimalexis   SUPRAVENTRICULAR TACHYCARDIA 05/21/2020   had ablation - Qualifier: Diagnosis of  By: Gwenn Grimes   Past Surgical History:  Procedure Laterality Date   BREAST BIOPSY Left 12/14/2021   BREAST ENHANCEMENT SURGERY Right 01/19/2023   Procedure: MAMMOPLASTY AUGMENTATION (BREAST);  Surgeon: Arelia Filippo, MD;  Location: [facility];  Service: Plastics;  Laterality: Right;   BREAST RECONSTRUCTION WITH PLACEMENT OF TISSUE EXPANDER AND ALLODERM Left 01/31/2022   Procedure: BREAST RECONSTRUCTION WITH PLACEMENT OF TISSUE EXPANDER AND ALLODERM;  Surgeon: Arelia Filippo, MD;  Location: [facility];  Service: Plastics;  Laterality: Left;   CARPAL TUNNEL WITH CUBITAL TUNNEL Bilateral    EYE SURGERY  2006   INCISION AND DRAINAGE OF WOUND Left 02/18/2022   Procedure: IRRIGATION AND DEBRIDEMENT WOUND-LEFT BREAST;  Surgeon: Arelia Filippo, MD;  Location: [facility];  Service: Plastics;  Laterality: Left;   KNEE SURGERY     MASTECTOMY W/ SENTINEL NODE BIOPSY Left 01/31/2022   Procedure: LEFT NIPPLE SPARING MASTECTOMY AND SENTINEL LYMPH NODE BIOPSY;   Surgeon: Vernetta Berg, MD;  Location: [facility];  Service: General;  Laterality: Left;   REFRACTIVE SURGERY     REMOVAL OF TISSUE EXPANDER AND PLACEMENT OF IMPLANT Left 01/19/2023   Procedure: REMOVAL OF TISSUE EXPANDER AND PLACEMENT OF IMPLANT;  Surgeon: Arelia Filippo, MD;  Location: [facility];  Service: Plastics;  Laterality: Left;   SVT ABLATION N/A 05/21/2020   Procedure: SVT ABLATION;  Surgeon: Cindie Ole DASEN, MD;  Location: [facility] INVASIVE CV LAB;  Service: Cardiovascular;  Laterality: N/A;   TISSUE EXPANDER PLACEMENT Left 03/24/2022   Procedure: TISSUE EXPANDER - REMOVAL AND REPLACEMENT;  Surgeon: Arelia Filippo, MD;  Location: [facility];  Service: Plastics;  Laterality: Left;   TRIGGER FINGER RELEASE Right    TRIGGER FINGER RELEASE Left 01/15/2023   [facility]   WISDOM TOOTH EXTRACTION     Patient Active Problem List   Diagnosis Date Noted   Osteopenia after menopause 12/14/2022   Breast cancer, left breast (HCC) 01/31/2022   Malignant neoplasm of upper-outer quadrant of left breast in female, estrogen receptor positive (HCC) 12/26/2021   SVT (supraventricular tachycardia) (HCC) 12/27/2009   DIAPHORESIS 12/27/2009   PALPITATIONS 12/27/2009   SHORTNESS OF BREATH 12/27/2009    PCP: Darcel Pool, MD   REFERRING PROVIDER: Darcel Pool, MD   REFERRING DIAG: N39.46 (ICD-10-CM) - Mixed incontinence  THERAPY DIAG:  Abnormal posture  Muscle weakness (generalized)  Unspecified lack of coordination  Rationale for Evaluation and Treatment: Rehabilitation  ONSET DATE: 5-6 years ago (2019)  SUBJECTIVE:                                                                                                                                                                                           SUBJECTIVE STATEMENT: Pt states that she will leak urine and now has to wear thick poise pads. This gets in the way of  traveling. She is having more urgency and feels like she always has to look for the bathroom. She is done with cancer treatment.    PAIN:  Are you having pain? No  PRECAUTIONS: Other: history of breast cancer  RED FLAGS: None   WEIGHT BEARING RESTRICTIONS: No  FALLS:  Has patient fallen in last 6 months? No  OCCUPATION: not working  ACTIVITY LEVEL : no exercise currently   PLOF: Independent  PATIENT GOALS: to be able to travel without worrying about bladder   PERTINENT HISTORY:  Breast cancer 2023, mastectomy Lt 2023, necrosis formed and removed after mastectomy, third surgery for first reconstruction, second surgery for reconstruction, radiation Sexual abuse: No  BOWEL MOVEMENT: Pain with bowel movement: No Type of bowel movement:Frequency 1x/day and Strain strain Fully empty rectum: Yes:   Leakage: No Pads: Yes: see below Fiber supplement/laxative No  URINATION: Pain with urination: No Fully empty bladder: Yes:   Stream: Strong Urgency: Yes  Frequency: 8-10x/day; 1x/night Fluid Intake: tries to limit due to incontinence Leakage: Urge to void, Walking to the bathroom, Coughing, Sneezing, Laughing, and just sitting without reason Pads: Yes: heavy poise pads (3) x2  INTERCOURSE:  Ability to have vaginal penetration Yes  Pain with intercourse: none Dryness Yes  Climax: WNL Marinoff Scale: 0/3 Lubricant: sometimes   PREGNANCY: Vaginal deliveries 2 Tearing Yes: small Episiotomy No C-section deliveries 0 Currently pregnant No  PROLAPSE: None   OBJECTIVE:  Note: Objective measures were completed at Evaluation unless otherwise noted.  02/20/24: PATIENT SURVEYS:   PFIQ-7: 11  COGNITION: Overall cognitive status: Within functional limits for tasks assessed     SENSATION: Light touch: Appears intact   FUNCTIONAL TESTS:  Squat: WNL Single leg stance:  Rt: pelvic drop  Lt: pelvic drop Curl-up test: abdominal distortion throughout upper and  lower   GAIT: Assistive device utilized: None Comments: WNL  POSTURE: rounded shoulders, forward head, decreased lumbar lordosis, increased thoracic kyphosis, and posterior pelvic tilt   LUMBARAROM/PROM:  A/PROM A/PROM  Eval (% available)  Flexion 100  Extension 75  Right lateral flexion 75  Left lateral flexion 75  Right rotation 75  Left rotation 75   (Blank  rows = not tested)  PALPATION:   General: Lt upper chest scar tissue restriction  Pelvic Alignment: posterior pelvic tilt  Abdominal: tension throughout abdomen                External Perineal Exam: Lt labial whiteness in tissue starting between clitoral hood and labia majora descending down to labia minor/major attachment - slightly tender - pt shown with mirror                             Internal Pelvic Floor: mild atrophy, no tenderness  Patient confirms identification and approves PT to assess internal pelvic floor and treatment Yes  PELVIC MMT:   MMT eval  Vaginal 2/5, 4 second hold, 7 repeat contractions  Diastasis Recti 3 finger width diastasis   (Blank rows = not tested)        TONE: low  PROLAPSE: WNL  TODAY'S TREATMENT:                                                                                                                              DATE:  02/20/24 EVAL  Neuromuscular re-education: Pt provides verbal consent for internal vaginal/rectal pelvic floor exam. Internal pelvic floor muscle contraction training Quick flicks Long holds The knack Urge drill Therapeutic activities: Vulvovaginal massage  Vaginal moisturizers Lubricants (samples given of silicone based)    PATIENT EDUCATION:  Education details: See above Person educated: Patient Education method: Explanation, Demonstration, Tactile cues, Verbal cues, and Handouts Education comprehension: verbalized understanding  HOME EXERCISE PROGRAM: [id number]  ASSESSMENT:  CLINICAL IMPRESSION: Patient is a 58 y.o. female  who was seen today for physical therapy evaluation and treatment for urinary urgency and incontinence. Exam findings notable for abnormal posture, small decrease in lumbar A/ROM, pelvic instability in single leg stance, diastasis rect abdominus with abdominal distortion upon increased abdominal pressure, overall tightness throughout abdomen, Lt chest scar tissue restriction, Lt whiteness over Lt vulva, low tone pelvic floor muscles, pelvic floor muscle weakness, decreased coordination of pelvic floor muscle contraction, and decreased pelvic floor muscle endurance. Signs and symptoms are most consistent with low tone pelvic floor muscles and pelvic floor muscle weakness; she also has some motor control issues and tends to bear down and brace her abdominals, increasing pressure on bladder/pelvic floor muscles when trying to control. Initial treatment worked on improving coordination of contracting pelvic floor muscle with appropriate breath coordination, the knack, urge drill, lubricants, and vaginal moisturizers. Due to seeing area of marked skin whiteness, she was encouraged to make appointment with OBGYN to rule out lichens sclerosis. She will continue to benefit from skilled PT intervention in order to decrease urinary urgency/incontinence, address all impairments, improve quality of life, and progress functional strengthening program.   OBJECTIVE IMPAIRMENTS: decreased activity tolerance, decreased coordination, decreased endurance, decreased mobility, decreased ROM, decreased strength, increased fascial restrictions, increased muscle spasms, impaired flexibility, impaired tone, improper body mechanics,  postural dysfunction, and pain.   ACTIVITY LIMITATIONS: lifting, bending, and continence  PARTICIPATION LIMITATIONS: cleaning, laundry, interpersonal relationship, and community activity  PERSONAL FACTORS: 1 comorbidity: medical history are also affecting patient's functional outcome.   REHAB POTENTIAL:  Good  CLINICAL DECISION MAKING: Stable/uncomplicated  EVALUATION COMPLEXITY: Low   GOALS: Goals reviewed with patient? Yes  SHORT TERM GOALS: Target date: 03/19/2024   Pt will be independent with HEP.   Baseline: Goal status: INITIAL  2.  Pt will report 25% improvement in urinary incontinence in order to decrease anxiety with travel.  Baseline:  Goal status: INITIAL  3.  Pt will be able to teach back and utilize urge suppression technique in order to help reduce number of trips to the bathroom.    Baseline:  Goal status: INITIAL  4.  Pt will be independent with the knack in order to reduce stress urinary incontinence.  Baseline:  Goal status: INITIAL  5.  Pt will increase pelvic floor muscle strength to 3/5 in order to reduce urinary incontinence with laughing, coughing, and sneezing.  Baseline:  Goal status: INITIAL   LONG TERM GOALS: Target date: 08/06/2024  Pt will be independent with advanced HEP.   Baseline:  Goal status: INITIAL  2.  Pt will report 75% improvement in urinary incontinence in order to decrease anxiety with travel.  Baseline:  Goal status: INITIAL  3.  Pt will be able to go 2-3 hours in between voids without urgency or incontinence in order to improve QOL and perform all functional activities with less difficulty.   Baseline:  Goal status: INITIAL  4.  Pt will increase pelvic floor muscle endurance to greater than 10 seconds in order to decrease urinary incontinence what laughing, coughing, and sneezing.  Baseline:  Goal status: INITIAL  5.  Pt will report use of no more than one light pad a day. Baseline:  Goal status: INITIAL  6.  Pt will report no apprehension with traveling due to fear of finding bathroom or leaking.  Baseline:  Goal status: INITIAL  PLAN:  PT FREQUENCY: 1-2x/week  PT DURATION: 6 months   PLANNED INTERVENTIONS: 97110-Therapeutic exercises, 97530- Therapeutic activity, 97112- Neuromuscular re-education, 97535-  Self Care, [id number]- Manual therapy, Dry Needling, and Biofeedback  PLAN FOR NEXT SESSION: begin core training and progress into more challenging functional strengthening exercises.    Josette Mares, PT, DPT07/09/253:29 PM

## 2024-02-28 ENCOUNTER — Ambulatory Visit

## 2024-02-28 DIAGNOSIS — R293 Abnormal posture: Secondary | ICD-10-CM

## 2024-02-28 DIAGNOSIS — R279 Unspecified lack of coordination: Secondary | ICD-10-CM

## 2024-02-28 DIAGNOSIS — M6281 Muscle weakness (generalized): Secondary | ICD-10-CM

## 2024-02-28 NOTE — Therapy (Signed)
 OUTPATIENT PHYSICAL THERAPY FEMALE PELVIC TREATMENT   Patient Name: Autumn Wyatt MRN: [id number] DOB:1966/04/02, 58 y.o., female Today's Date: 02/28/2024  END OF SESSION:  PT End of Session - 02/28/24 1445     Visit Number 2    Date for PT Re-Evaluation 08/06/24    Authorization Type BCBS    Authorization Time Period 02/20/2024-04/19/2024    Authorization - Visit Number 1    Authorization - Number of Visits 5    PT Start Time 1444    PT Stop Time 1526    PT Time Calculation (min) 42 min    Activity Tolerance Patient tolerated treatment well    Behavior During Therapy [facility] for tasks assessed/performed           Past Medical History:  Diagnosis Date   Abnormal pap    Allergy    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, LCIS   DIAPHORESIS 12/27/2009   Qualifier: Diagnosis of  By: Gwenn Grimes     Hypertension    Palpitations 12/27/2009   no current problems, Qualifier: Diagnosis of  By: Barnes, Kimalexis   SUPRAVENTRICULAR TACHYCARDIA 05/21/2020   had ablation - Qualifier: Diagnosis of  By: Gwenn Grimes   Past Surgical History:  Procedure Laterality Date   BREAST BIOPSY Left 12/14/2021   BREAST ENHANCEMENT SURGERY Right 01/19/2023   Procedure: MAMMOPLASTY AUGMENTATION (BREAST);  Surgeon: Arelia Filippo, MD;  Location: [facility];  Service: Plastics;  Laterality: Right;   BREAST RECONSTRUCTION WITH PLACEMENT OF TISSUE EXPANDER AND ALLODERM Left 01/31/2022   Procedure: BREAST RECONSTRUCTION WITH PLACEMENT OF TISSUE EXPANDER AND ALLODERM;  Surgeon: Arelia Filippo, MD;  Location: [facility];  Service: Plastics;  Laterality: Left;   CARPAL TUNNEL WITH CUBITAL TUNNEL Bilateral    EYE SURGERY  2006   INCISION AND DRAINAGE OF WOUND Left 02/18/2022   Procedure: IRRIGATION AND DEBRIDEMENT WOUND-LEFT BREAST;  Surgeon: Arelia Filippo, MD;  Location: [facility];  Service: Plastics;  Laterality: Left;   KNEE SURGERY     MASTECTOMY  W/ SENTINEL NODE BIOPSY Left 01/31/2022   Procedure: LEFT NIPPLE SPARING MASTECTOMY AND SENTINEL LYMPH NODE BIOPSY;  Surgeon: Vernetta Berg, MD;  Location: [facility];  Service: General;  Laterality: Left;   REFRACTIVE SURGERY     REMOVAL OF TISSUE EXPANDER AND PLACEMENT OF IMPLANT Left 01/19/2023   Procedure: REMOVAL OF TISSUE EXPANDER AND PLACEMENT OF IMPLANT;  Surgeon: Arelia Filippo, MD;  Location: [facility];  Service: Plastics;  Laterality: Left;   SVT ABLATION N/A 05/21/2020   Procedure: SVT ABLATION;  Surgeon: Cindie Ole DASEN, MD;  Location: [facility] INVASIVE CV LAB;  Service: Cardiovascular;  Laterality: N/A;   TISSUE EXPANDER PLACEMENT Left 03/24/2022   Procedure: TISSUE EXPANDER - REMOVAL AND REPLACEMENT;  Surgeon: Arelia Filippo, MD;  Location: [facility];  Service: Plastics;  Laterality: Left;   TRIGGER FINGER RELEASE Right    TRIGGER FINGER RELEASE Left 01/15/2023   [facility]   WISDOM TOOTH EXTRACTION     Patient Active Problem List   Diagnosis Date Noted   Osteopenia after menopause 12/14/2022   Breast cancer, left breast (HCC) 01/31/2022   Malignant neoplasm of upper-outer quadrant of left breast in female, estrogen receptor positive (HCC) 12/26/2021   SVT (supraventricular tachycardia) (HCC) 12/27/2009   DIAPHORESIS 12/27/2009   PALPITATIONS 12/27/2009   SHORTNESS OF BREATH 12/27/2009    PCP: Darcel Pool, MD   REFERRING PROVIDER: Darcel Pool,  MD   REFERRING DIAG: N39.46 (ICD-10-CM) - Mixed incontinence  THERAPY DIAG:  Abnormal posture  Muscle weakness (generalized)  Unspecified lack of coordination  Rationale for Evaluation and Treatment: Rehabilitation  ONSET DATE: 5-6 years ago (2019)  SUBJECTIVE:                                                                                                                                                                                            SUBJECTIVE STATEMENT: Pt states that she has not had any changes. She does state that the urge drill is helpful when she can remember to stop and use it.    PAIN:  Are you having pain? No  PRECAUTIONS: Other: history of breast cancer  RED FLAGS: None   WEIGHT BEARING RESTRICTIONS: No  FALLS:  Has patient fallen in last 6 months? No  OCCUPATION: not working  ACTIVITY LEVEL : no exercise currently   PLOF: Independent  PATIENT GOALS: to be able to travel without worrying about bladder   PERTINENT HISTORY:  Breast cancer 2023, mastectomy Lt 2023, necrosis formed and removed after mastectomy, third surgery for first reconstruction, second surgery for reconstruction, radiation Sexual abuse: No  BOWEL MOVEMENT: Pain with bowel movement: No Type of bowel movement:Frequency 1x/day and Strain strain Fully empty rectum: Yes:   Leakage: No Pads: Yes: see below Fiber supplement/laxative No  URINATION: Pain with urination: No Fully empty bladder: Yes:   Stream: Strong Urgency: Yes  Frequency: 8-10x/day; 1x/night Fluid Intake: tries to limit due to incontinence Leakage: Urge to void, Walking to the bathroom, Coughing, Sneezing, Laughing, and just sitting without reason Pads: Yes: heavy poise pads (3) x2  INTERCOURSE:  Ability to have vaginal penetration Yes  Pain with intercourse: none Dryness Yes  Climax: WNL Marinoff Scale: 0/3 Lubricant: sometimes   PREGNANCY: Vaginal deliveries 2 Tearing Yes: small Episiotomy No C-section deliveries 0 Currently pregnant No  PROLAPSE: None   OBJECTIVE:  Note: Objective measures were completed at Evaluation unless otherwise noted.  02/20/24: PATIENT SURVEYS:   PFIQ-7: 32  COGNITION: Overall cognitive status: Within functional limits for tasks assessed     SENSATION: Light touch: Appears intact   FUNCTIONAL TESTS:  Squat: WNL Single leg stance:  Rt: pelvic drop  Lt: pelvic drop Curl-up test: abdominal  distortion throughout upper and lower   GAIT: Assistive device utilized: None Comments: WNL  POSTURE: rounded shoulders, forward head, decreased lumbar lordosis, increased thoracic kyphosis, and posterior pelvic tilt   LUMBARAROM/PROM:  A/PROM A/PROM  Eval (% available)  Flexion 100  Extension 75  Right lateral flexion 75  Left lateral flexion 75  Right  rotation 75  Left rotation 75   (Blank rows = not tested)  PALPATION:   General: Lt upper chest scar tissue restriction  Pelvic Alignment: posterior pelvic tilt  Abdominal: tension throughout abdomen                External Perineal Exam: Lt labial whiteness in tissue starting between clitoral hood and labia majora descending down to labia minor/major attachment - slightly tender - pt shown with mirror                             Internal Pelvic Floor: mild atrophy, no tenderness  Patient confirms identification and approves PT to assess internal pelvic floor and treatment Yes  PELVIC MMT:   MMT eval  Vaginal 2/5, 4 second hold, 7 repeat contractions  Diastasis Recti 3 finger width diastasis   (Blank rows = not tested)        TONE: low  PROLAPSE: WNL  TODAY'S TREATMENT:                                                                                                                              DATE:  02/28/24 Neuromuscular re-education: Transversus abdominus training with multimodal cues for improved motor control and breath coordination Bil supine UE ball press with transversus abdominus and pelvic floor muscle contractions and breath coordination 10x Supine hip adduction ball press with transversus abdominus and pelvic floor muscle contractions and breath coordination 10x Bridge with hip adduction, transversus abdominus, and pelvic floor muscle 2 x 10 Unilateral side lying UE ball press in clam shell with transversus abdominus and pelvic floor muscle contractions and breath coordination 10x Supine leg  extensions 10x bil Supine shoulder flexion with weight 5lbs 10x Pelvic floor muscle contraction with bil thigh press 10x Squats with cues for pelvic floor muscle contraction and breath coordination - to table 10x   02/20/24 EVAL  Neuromuscular re-education: Pt provides verbal consent for internal vaginal/rectal pelvic floor exam. Internal pelvic floor muscle contraction training Quick flicks Long holds The knack Urge drill Therapeutic activities: Vulvovaginal massage  Vaginal moisturizers Lubricants (samples given of silicone based)    PATIENT EDUCATION:  Education details: See above Person educated: Patient Education method: Programmer, multimedia, Demonstration, Tactile cues, Verbal cues, and Handouts Education comprehension: verbalized understanding  HOME EXERCISE PROGRAM: [id number]  ASSESSMENT:  CLINICAL IMPRESSION: Pt has started seeing some improvement with urgency and incontinence on the way to the bathroom with use of the urge drill. She did very well with core training and incorporating pelvic floor muscle contraction into other exercises. HEP updated. She did have some difficulty wit hcoordination and breath holding, but cuing helpful. She will continue to benefit from skilled PT intervention in order to decrease urinary urgency/incontinence, address all impairments, improve quality of life, and progress functional strengthening program.   OBJECTIVE IMPAIRMENTS: decreased activity tolerance, decreased coordination, decreased endurance, decreased mobility, decreased ROM, decreased strength,  increased fascial restrictions, increased muscle spasms, impaired flexibility, impaired tone, improper body mechanics, postural dysfunction, and pain.   ACTIVITY LIMITATIONS: lifting, bending, and continence  PARTICIPATION LIMITATIONS: cleaning, laundry, interpersonal relationship, and community activity  PERSONAL FACTORS: 1 comorbidity: medical history are also affecting patient's functional  outcome.   REHAB POTENTIAL: Good  CLINICAL DECISION MAKING: Stable/uncomplicated  EVALUATION COMPLEXITY: Low   GOALS: Goals reviewed with patient? Yes  SHORT TERM GOALS: Target date: 03/19/2024   Pt will be independent with HEP.   Baseline: Goal status: INITIAL  2.  Pt will report 25% improvement in urinary incontinence in order to decrease anxiety with travel.  Baseline:  Goal status: INITIAL  3.  Pt will be able to teach back and utilize urge suppression technique in order to help reduce number of trips to the bathroom.    Baseline:  Goal status: INITIAL  4.  Pt will be independent with the knack in order to reduce stress urinary incontinence.  Baseline:  Goal status: INITIAL  5.  Pt will increase pelvic floor muscle strength to 3/5 in order to reduce urinary incontinence with laughing, coughing, and sneezing.  Baseline:  Goal status: INITIAL   LONG TERM GOALS: Target date: 08/06/2024  Pt will be independent with advanced HEP.   Baseline:  Goal status: INITIAL  2.  Pt will report 75% improvement in urinary incontinence in order to decrease anxiety with travel.  Baseline:  Goal status: INITIAL  3.  Pt will be able to go 2-3 hours in between voids without urgency or incontinence in order to improve QOL and perform all functional activities with less difficulty.   Baseline:  Goal status: INITIAL  4.  Pt will increase pelvic floor muscle endurance to greater than 10 seconds in order to decrease urinary incontinence what laughing, coughing, and sneezing.  Baseline:  Goal status: INITIAL  5.  Pt will report use of no more than one light pad a day. Baseline:  Goal status: INITIAL  6.  Pt will report no apprehension with traveling due to fear of finding bathroom or leaking.  Baseline:  Goal status: INITIAL  PLAN:  PT FREQUENCY: 1-2x/week  PT DURATION: 6 months   PLANNED INTERVENTIONS: 97110-Therapeutic exercises, 97530- Therapeutic activity, 97112-  Neuromuscular re-education, 97535- Self Care, [id number]- Manual therapy, Dry Needling, and Biofeedback  PLAN FOR NEXT SESSION: begin core training and progress into more challenging functional strengthening exercises.    Josette Mares, PT, DPT07/17/253:31 PM

## 2024-03-24 ENCOUNTER — Ambulatory Visit: Attending: Obstetrics and Gynecology

## 2024-03-24 DIAGNOSIS — R279 Unspecified lack of coordination: Secondary | ICD-10-CM | POA: Diagnosis present

## 2024-03-24 DIAGNOSIS — M6281 Muscle weakness (generalized): Secondary | ICD-10-CM | POA: Diagnosis present

## 2024-03-24 DIAGNOSIS — R293 Abnormal posture: Secondary | ICD-10-CM | POA: Insufficient documentation

## 2024-03-24 NOTE — Therapy (Signed)
 OUTPATIENT PHYSICAL THERAPY FEMALE PELVIC TREATMENT   Patient Name: Autumn Wyatt MRN: [id number] DOB:1965-11-29, 58 y.o., female Today's Date: 03/24/2024  END OF SESSION:  PT End of Session - 03/24/24 1016     Visit Number 3    Date for PT Re-Evaluation 08/06/24    Authorization Type [employer]    Authorization Time Period 02/20/2024-04/19/2024    Authorization - Visit Number 2    Authorization - Number of Visits 5    PT Start Time 1015    PT Stop Time 1055    PT Time Calculation (min) 40 min    Activity Tolerance Patient tolerated treatment well    Behavior During Therapy [facility] for tasks assessed/performed            Past Medical History:  Diagnosis Date   Abnormal pap    Allergy    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, LCIS   DIAPHORESIS 12/27/2009   Qualifier: Diagnosis of  By: Gwenn Grimes     Hypertension    Palpitations 12/27/2009   no current problems, Qualifier: Diagnosis of  By: Barnes, Kimalexis   SUPRAVENTRICULAR TACHYCARDIA 05/21/2020   had ablation - Qualifier: Diagnosis of  By: Gwenn Grimes   Past Surgical History:  Procedure Laterality Date   BREAST BIOPSY Left 12/14/2021   BREAST ENHANCEMENT SURGERY Right 01/19/2023   Procedure: MAMMOPLASTY AUGMENTATION (BREAST);  Surgeon: Arelia Filippo, MD;  Location: [facility];  Service: Plastics;  Laterality: Right;   BREAST RECONSTRUCTION WITH PLACEMENT OF TISSUE EXPANDER AND ALLODERM Left 01/31/2022   Procedure: BREAST RECONSTRUCTION WITH PLACEMENT OF TISSUE EXPANDER AND ALLODERM;  Surgeon: Arelia Filippo, MD;  Location: [facility];  Service: Plastics;  Laterality: Left;   CARPAL TUNNEL WITH CUBITAL TUNNEL Bilateral    EYE SURGERY  2006   INCISION AND DRAINAGE OF WOUND Left 02/18/2022   Procedure: IRRIGATION AND DEBRIDEMENT WOUND-LEFT BREAST;  Surgeon: Arelia Filippo, MD;  Location: [facility];  Service: Plastics;  Laterality: Left;   KNEE SURGERY      MASTECTOMY W/ SENTINEL NODE BIOPSY Left 01/31/2022   Procedure: LEFT NIPPLE SPARING MASTECTOMY AND SENTINEL LYMPH NODE BIOPSY;  Surgeon: Vernetta Berg, MD;  Location: [facility];  Service: General;  Laterality: Left;   REFRACTIVE SURGERY     REMOVAL OF TISSUE EXPANDER AND PLACEMENT OF IMPLANT Left 01/19/2023   Procedure: REMOVAL OF TISSUE EXPANDER AND PLACEMENT OF IMPLANT;  Surgeon: Arelia Filippo, MD;  Location: [facility];  Service: Plastics;  Laterality: Left;   SVT ABLATION N/A 05/21/2020   Procedure: SVT ABLATION;  Surgeon: Cindie Ole DASEN, MD;  Location: [facility] INVASIVE CV LAB;  Service: Cardiovascular;  Laterality: N/A;   TISSUE EXPANDER PLACEMENT Left 03/24/2022   Procedure: TISSUE EXPANDER - REMOVAL AND REPLACEMENT;  Surgeon: Arelia Filippo, MD;  Location:  SURGERY CENTER;  Service: Plastics;  Laterality: Left;   TRIGGER FINGER RELEASE Right    TRIGGER FINGER RELEASE Left 01/15/2023   at [facility]   WISDOM TOOTH EXTRACTION     Patient Active Problem List   Diagnosis Date Noted   Osteopenia after menopause 12/14/2022   Breast cancer, left breast (HCC) 01/31/2022   Malignant neoplasm of upper-outer quadrant of left breast in female, estrogen receptor positive (HCC) 12/26/2021   SVT (supraventricular tachycardia) (HCC) 12/27/2009   DIAPHORESIS 12/27/2009   PALPITATIONS 12/27/2009   SHORTNESS OF BREATH 12/27/2009    PCP: Darcel Pool, MD   REFERRING PROVIDER: Rivard,  Nena, MD   REFERRING DIAG: N39.46 (ICD-10-CM) - Mixed incontinence  THERAPY DIAG:  Abnormal posture  Muscle weakness (generalized)  Unspecified lack of coordination  Rationale for Evaluation and Treatment: Rehabilitation  ONSET DATE: 5-6 years ago (2019)  SUBJECTIVE:                                                                                                                                                                                            SUBJECTIVE STATEMENT: Pt states that she does not have to run to the bathroom like she used to. She has been trying very hard to work on exercises.    PAIN:  Are you having pain? No  PRECAUTIONS: Other: history of breast cancer  RED FLAGS: None   WEIGHT BEARING RESTRICTIONS: No  FALLS:  Has patient fallen in last 6 months? No  OCCUPATION: not working  ACTIVITY LEVEL : no exercise currently   PLOF: Independent  PATIENT GOALS: to be able to travel without worrying about bladder   PERTINENT HISTORY:  Breast cancer 2023, mastectomy Lt 2023, necrosis formed and removed after mastectomy, third surgery for first reconstruction, second surgery for reconstruction, radiation Sexual abuse: No  BOWEL MOVEMENT: Pain with bowel movement: No Type of bowel movement:Frequency 1x/day and Strain strain Fully empty rectum: Yes:   Leakage: No Pads: Yes: see below Fiber supplement/laxative No  URINATION: Pain with urination: No Fully empty bladder: Yes:   Stream: Strong Urgency: Yes  Frequency: 8-10x/day; 1x/night Fluid Intake: tries to limit due to incontinence Leakage: Urge to void, Walking to the bathroom, Coughing, Sneezing, Laughing, and just sitting without reason Pads: Yes: heavy poise pads (3) x2  INTERCOURSE:  Ability to have vaginal penetration Yes  Pain with intercourse: none Dryness Yes  Climax: WNL Marinoff Scale: 0/3 Lubricant: sometimes   PREGNANCY: Vaginal deliveries 2 Tearing Yes: small Episiotomy No C-section deliveries 0 Currently pregnant No  PROLAPSE: None   OBJECTIVE:  Note: Objective measures were completed at Evaluation unless otherwise noted.  02/20/24: PATIENT SURVEYS:   PFIQ-7: 56  COGNITION: Overall cognitive status: Within functional limits for tasks assessed     SENSATION: Light touch: Appears intact   FUNCTIONAL TESTS:  Squat: WNL Single leg stance:  Rt: pelvic drop  Lt: pelvic drop Curl-up test: abdominal distortion  throughout upper and lower   GAIT: Assistive device utilized: None Comments: WNL  POSTURE: rounded shoulders, forward head, decreased lumbar lordosis, increased thoracic kyphosis, and posterior pelvic tilt   LUMBARAROM/PROM:  A/PROM A/PROM  Eval (% available)  Flexion 100  Extension 75  Right lateral flexion 75  Left lateral flexion 75  Right  rotation 75  Left rotation 75   (Blank rows = not tested)  PALPATION:   General: Lt upper chest scar tissue restriction  Pelvic Alignment: posterior pelvic tilt  Abdominal: tension throughout abdomen                External Perineal Exam: Lt labial whiteness in tissue starting between clitoral hood and labia majora descending down to labia minor/major attachment - slightly tender - pt shown with mirror                             Internal Pelvic Floor: mild atrophy, no tenderness  Patient confirms identification and approves PT to assess internal pelvic floor and treatment Yes  PELVIC MMT:   MMT eval  Vaginal 2/5, 4 second hold, 7 repeat contractions  Diastasis Recti 3 finger width diastasis   (Blank rows = not tested)        TONE: low  PROLAPSE: WNL  TODAY'S TREATMENT:                                                                                                                              DATE:  03/24/24 Neuromuscular re-education: Bridge with hip adduction, transversus abdominus, and pelvic floor muscle 2 x 10 Modified dead bug 10x bil Supine full shoulder flexion with 8 lbs 2 x 10 Seated hip adduction ball press with transversus abdominus and pelvic floor muscle 2 x 10 Seated hip abduction red band with transversus abdominus and pelvic floor muscle 2 x 10 Therapeutic activities: Squats with cues for pelvic floor muscle contraction and breath coordination - to table 2 x 10 Staggered dead lift with rotation and unilateral 8 lb weight 10x bil Pallof press red band 2 x 10 bil Bil shoulder extensions red band 2 x  10 Standing 3 way kick red band 10x each, bil Resisted side steps 5 x 10 red band   02/28/24 Neuromuscular re-education: Transversus abdominus training with multimodal cues for improved motor control and breath coordination Bil supine UE ball press with transversus abdominus and pelvic floor muscle contractions and breath coordination 10x Supine hip adduction ball press with transversus abdominus and pelvic floor muscle contractions and breath coordination 10x Bridge with hip adduction, transversus abdominus, and pelvic floor muscle 2 x 10 Unilateral side lying UE ball press in clam shell with transversus abdominus and pelvic floor muscle contractions and breath coordination 10x Supine leg extensions 10x bil Supine shoulder flexion with weight 5lbs 10x Pelvic floor muscle contraction with bil thigh press 10x Squats with cues for pelvic floor muscle contraction and breath coordination - to table 10x   02/20/24 EVAL  Neuromuscular re-education: Pt provides verbal consent for internal vaginal/rectal pelvic floor exam. Internal pelvic floor muscle contraction training Quick flicks Long holds The knack Urge drill Therapeutic activities: Vulvovaginal massage  Vaginal moisturizers Lubricants (samples given of silicone based)    PATIENT EDUCATION:  Education  details: See above Person educated: Patient Education method: Explanation, Demonstration, Tactile cues, Verbal cues, and Handouts Education comprehension: verbalized understanding  HOME EXERCISE PROGRAM: [id number]  ASSESSMENT:  CLINICAL IMPRESSION: Pt doing well with urinary urgency. She has been focusing on performing Hep regularly. She was able to progress to more functional and gravity dependent exercises for pelvic floor muscles and core with good tolerance and core activation. She will continue to benefit from skilled PT intervention in order to decrease urinary urgency/incontinence, address all impairments, improve quality  of life, and progress functional strengthening program.   OBJECTIVE IMPAIRMENTS: decreased activity tolerance, decreased coordination, decreased endurance, decreased mobility, decreased ROM, decreased strength, increased fascial restrictions, increased muscle spasms, impaired flexibility, impaired tone, improper body mechanics, postural dysfunction, and pain.   ACTIVITY LIMITATIONS: lifting, bending, and continence  PARTICIPATION LIMITATIONS: cleaning, laundry, interpersonal relationship, and community activity  PERSONAL FACTORS: 1 comorbidity: medical history are also affecting patient's functional outcome.   REHAB POTENTIAL: Good  CLINICAL DECISION MAKING: Stable/uncomplicated  EVALUATION COMPLEXITY: Low   GOALS: Goals reviewed with patient? Yes  SHORT TERM GOALS: Updated 03/24/24   Pt will be independent with HEP.   Baseline: Goal status: Met 03/24/24  2.  Pt will report 25% improvement in urinary incontinence in order to decrease anxiety with travel.  Baseline:  Goal status: In progress 03/24/24  3.  Pt will be able to teach back and utilize urge suppression technique in order to help reduce number of trips to the bathroom.    Baseline:  Goal status: Met 03/24/24  4.  Pt will be independent with the knack in order to reduce stress urinary incontinence.  Baseline:  Goal status: Met 03/24/24  5.  Pt will increase pelvic floor muscle strength to 3/5 in order to reduce urinary incontinence with laughing, coughing, and sneezing.  Baseline:  Goal status: In progress 03/24/24   LONG TERM GOALS: Updated 03/24/24  Pt will be independent with advanced HEP.   Baseline:  Goal status: In progress 03/24/24  2.  Pt will report 75% improvement in urinary incontinence in order to decrease anxiety with travel.  Baseline:  Goal status: In progress 03/24/24  3.  Pt will be able to go 2-3 hours in between voids without urgency or incontinence in order to improve QOL and perform all  functional activities with less difficulty.   Baseline:  Goal status: In progress 03/24/24  4.  Pt will increase pelvic floor muscle endurance to greater than 10 seconds in order to decrease urinary incontinence what laughing, coughing, and sneezing.  Baseline:  Goal status: In progress 03/24/24  5.  Pt will report use of no more than one light pad a day. Baseline:  Goal status: In progress 03/24/24  6.  Pt will report no apprehension with traveling due to fear of finding bathroom or leaking.  Baseline:  Goal status: In progress 03/24/24  PLAN:  PT FREQUENCY: 1-2x/week  PT DURATION: 6 months   PLANNED INTERVENTIONS: 97110-Therapeutic exercises, 97530- Therapeutic activity, 97112- Neuromuscular re-education, 97535- Self Care, [id number]- Manual therapy, Dry Needling, and Biofeedback  PLAN FOR NEXT SESSION: begin core training and progress into more challenging functional strengthening exercises.    Josette Mares, PT, DPT08/06/2509:51 AM

## 2024-04-02 ENCOUNTER — Ambulatory Visit
Admission: RE | Admit: 2024-04-02 | Discharge: 2024-04-02 | Disposition: A | Discharge status: Home / Self Care | Source: Ambulatory Visit | Attending: Nurse Practitioner | Admitting: Nurse Practitioner

## 2024-04-02 DIAGNOSIS — Z170 Estrogen receptor positive status [ER+]: Secondary | ICD-10-CM

## 2024-04-02 MED ORDER — GADOPICLENOL 0.5 MMOL/ML IV SOLN
7.0000 mL | Freq: Once | INTRAVENOUS | Status: AC | PRN
  Administered 2024-04-02: 7 mL via INTRAVENOUS

## 2024-04-07 ENCOUNTER — Other Ambulatory Visit: Payer: Self-pay | Admitting: Nurse Practitioner

## 2024-04-07 ENCOUNTER — Ambulatory Visit

## 2024-04-07 DIAGNOSIS — Z170 Estrogen receptor positive status [ER+]: Secondary | ICD-10-CM

## 2024-04-07 NOTE — Assessment & Plan Note (Signed)
 invasive lobular carcinoma, stage IA, p(T3, N0), ER+/PR+/HER2-, Grade 1/2, (+)  LCIS -found on screening mammogram. S/p left mastectomy on 01/31/22 by Dr. Magnus Ivan, path showed: 6.1 cm invasive lobular carcinoma with LCIS. Margins and lymph node were negative. -Oncotype RS 10, low risk -she has completed adjuvant post-mastectomy radiation under Dr. Mitzi Hansen on 06/15/22. -She tried adjuvant letrozole, could not tolerate due to arthralgia.  She was switched to exemestane in July 2024. -Has been tolerating exemestane well.  Next screening mammogram due April 2025.

## 2024-04-07 NOTE — Progress Notes (Unsigned)
 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 S/p left mastectomy on 01/31/22 by Dr. Vernetta, path showed: 6.1 cm invasive lobular carcinoma with LCIS. Margins and lymph node were negative. -Oncotype RS 10, low risk -she has completed adjuvant post-mastectomy radiation under Dr. Dewey on 06/15/22. -She tried adjuvant letrozole , could not tolerate due to arthralgia.  She was switched to exemestane  in July 2024. -Has been tolerating exemestane  well.   - Bilateral breast MRI done 04/02/2024.  Results were benign. -Unilateral right screening mammogram recommended in February 2026. -Repeat bilateral breast MRI in August 2026. -Continue exemestane  daily. -Plan for labs and follow-up in 6 months.  Understand she would likely switch to [facility] only provider.  In that case, we will follow-up with patient as needed.   Breast cancer care The patient plans to change medical insurance which only covers providers at [facility]. She has already had a consult with Dr. Comer Hoyer, oncology. She has a routine appointment scheduled for 07/2024. The patient would like to hold off on additional imaging studies ordered from here until after she meets with Dr. Hoyer again.   Plan: Labs reviewed.  -stable CBC with  mild elevation in Hgb and Hct.  -CMP unremarkable.  -Ca 27-29 pending Reviewed recent bilateral breast MRI which was benign.  Unilateral right mammogram recommended for 09/2024 and repeat breast MRI in 03/2025.  Continue exemestane  daily. Will plan for follow up in 6 months with labs if patient's insurance does not change.  The patient understands the plans discussed today and is in agreement with them.  She knows to contact our office if she develops concerns prior to her next appointment.  I provided 25 minutes of face-to-face time during this encounter and > 50% was spent counseling as documented under my assessment and plan.    Autumn Wyatt  E Shayla Heming, NP  [facility] - A DEPT OF [facility] [address] [place] [address] Dept: [phone] Dept Fax: [phone]   No orders of the defined types were placed in this encounter.     CHIEF COMPLAINT:  CC: Left breast cancer, ER +  Current Treatment: Exemestane  (started 02/04/2023)  INTERVAL HISTORY:  Autumn Wyatt is here today for repeat clinical assessment.  On 09/26/2023.  Bilateral breast MRI was done 04/02/2024.  There was no evidence of malignancy in the right breast or left mastectomy site.  She has intact silicone implants.  An annual right screening mammogram should be done 09/2024.  Annual breast MRI should be done in August 2026 due to high risk from lobular breast cancer history.  From a cancer perspective, she denies new problems or concerns.  She has not noticed any new problems, masses, or lumps in either breast.  She is planning to switch insurance in December 2025.  This will limit her to [facility] providers only.  She has had consult  with Dr. Marquita, and [facility].  She has a follow-up scheduled in December with that provider.  She denies chest pain, chest pressure, or shortness of breath. She denies headaches or visual disturbances. She denies abdominal pain,  nausea, vomiting, or changes in bowel or bladder habits.  She denies fevers or chills. She denies pain. Her appetite is good. Her weight has been stable.  I have reviewed the past medical history, past surgical history, social history and family history with the patient and they are unchanged from previous note.  ALLERGIES:  is allergic to codeine, demerol [meperidine], oxycodone, tape, and versed  [midazolam ].  MEDICATIONS:  Current Outpatient Medications  Medication Sig Dispense Refill   acetaminophen  (TYLENOL ) 500 MG tablet Take 500-1,000 mg by mouth every 6 (six) hours as needed (pain.).     Calcium Carb-Cholecalciferol (CALCIUM 600+D3 PO) Take by mouth.     exemestane  (AROMASIN ) 25 MG tablet Take 1 tablet (25 mg total) by mouth daily after breakfast. 90 tablet 1   ibuprofen (ADVIL,MOTRIN) 600 MG tablet Take 400-600 mg by mouth every 6 (six) hours as needed for headache.     No current facility-administered medications for this visit.    HISTORY OF PRESENT ILLNESS:   Oncology History Overview Note   Cancer Staging  Malignant neoplasm of upper-outer quadrant of left breast in female, estrogen receptor positive (HCC) Staging form: Breast, AJCC 8th Edition - Clinical: Stage IA (cT1c, cN0, cM0, G2, ER+, PR+, HER2-) - Unsigned Stage prefix: Initial diagnosis Method of lymph node assessment: Clinical Histologic grading system: 3 grade system - Pathologic stage from 02/20/2022: Stage IA (pT3, pN0(sn), cM0, G1, ER+, PR+, HER2-, Oncotype DX score: 10) - Signed by Lanell Donald Stagger, PA-C on 02/20/2022 Stage prefix: Initial diagnosis Method of lymph node assessment: Sentinel lymph node biopsy Multigene prognostic tests performed: Oncotype DX Recurrence score range: Less than 11 Histologic grading system: 3 grade system     Malignant neoplasm of upper-outer quadrant of left breast in female, estrogen receptor positive (HCC)  12/13/2021 Mammogram   CLINICAL DATA:  58 year old female for  further evaluation of possible LEFT breast distortion on screening mammogram.   EXAM: DIGITAL DIAGNOSTIC UNILATERAL LEFT MAMMOGRAM WITH TOMOSYNTHESIS AND CAD; ULTRASOUND LEFT BREAST LIMITED  IMPRESSION: 1. Highly suspicious 1.1 cm UPPER-OUTER LEFT breast mass. Tissue sampling is recommended. 2. No abnormal appearing LEFT axillary lymph nodes.   12/14/2021 Initial Biopsy   Diagnosis Breast, left, needle core biopsy, 2:00 5cmfn - INVASIVE MAMMARY CARCINOMA, GRADE 1/2. - MAMMARY CARCINOMA IN SITU. - SEE NOTE. Diagnosis Note The greatest tumor dimension is 0.9 cm.  Addendum: Immunohistochemistry for E-cadherin is negative consistent with lobular carcinoma.  PROGNOSTIC INDICATORS Results: The tumor cells are EQUIVOCAL for Her2 (2+). Her2 by FISH will be performed and the results reported separately. Estrogen Receptor: 70%, POSITIVE, STRONG STAINING INTENSITY Progesterone Receptor: 90%, POSITIVE, STRONG STAINING INTENSITY Proliferation Marker Ki67: 1%  FLUORESCENCE IN-SITU HYBRIDIZATION Results: GROUP 5: HER2 **NEGATIVE**   01/31/2022 Surgery   left mastectomy by Dr. Vernetta, path showed: 6.1 cm invasive lobular carcinoma with LCIS. Margins and lymph node were negative.    01/31/2022 Oncotype testing   10/3%, <1% absolute chemotherapy benefit   02/20/2022 Cancer Staging   Staging form: Breast, AJCC 8th Edition - Pathologic stage from 02/20/2022: Stage IA (pT3, pN0(sn), cM0, G1, ER+, PR+, HER2-, Oncotype DX score: 10) - Signed by Lanell Donald Stagger, PA-C on 02/20/2022 Stage prefix: Initial diagnosis Method of lymph node assessment: Sentinel lymph node biopsy Multigene prognostic tests  performed: Oncotype DX Recurrence score range: Less than 11 Histologic grading system: 3 grade system   05/01/2022 - 06/15/2022 Radiation Therapy   Site Technique Total Dose (Gy) Dose per Fx (Gy) Completed Fx Beam Energies  Chest Wall, Left: CW_L 3D 50.4/50.4 1.8 28/28 10XFFF  Chest Wall, Left:  CW_L_SCLV 3D 50.4/50.4 1.8 28/28 6X, 10X  Chest Wall, Left: CW_L_Bst specialPort 10/10 2 5/5 6E     06/2022 - 02/02/2023 Anti-estrogen oral therapy   Letrozole . Stopped due to severe joint pain    02/04/2023 -  Anti-estrogen oral therapy   exemestane    04/02/2024 Imaging   Bilateral breast MRI with and without contrast IMPRESSION: 1. No MRI evidence of malignancy involving the RIGHT breast. 2. No evidence of recurrent malignancy in the LEFT mastectomy site. 3. Intact prepectoral silicone implants.   RECOMMENDATION: 1. Annual screening RIGHT mammography which is due currently as the most recent prior mammogram was in April, 2024. 2. Annual or biennial high-risk screening MRI can be considered given the patient's history of lobular cancer and lobular neoplasia.   BI-RADS CATEGORY  2: Benign.       REVIEW OF SYSTEMS:   Constitutional: Denies fevers, chills or abnormal weight loss Eyes: Denies blurriness of vision Ears, nose, mouth, throat, and face: Denies mucositis or sore throat Respiratory: Denies cough, dyspnea or wheezes Cardiovascular: Denies palpitation, chest discomfort or lower extremity swelling Gastrointestinal:  Denies nausea, heartburn or change in bowel habits Skin: Denies abnormal skin rashes Lymphatics: Denies new lymphadenopathy or easy bruising Neurological:Denies numbness, tingling or new weaknesses Behavioral/Psych: Mood is stable, no new changes  All other systems were reviewed with the patient and are negative.   VITALS:   Today's Vitals   04/08/24 1027 04/08/24 1030 04/08/24 1208  BP: (!) 170/90 (!) 140/80   Pulse:  80   Resp:  17   Temp:  98.2 F (36.8 C)   SpO2:  98%   Weight:  156 lb 12.8 oz (71.1 kg)   PainSc:   0-No pain   Body mass index is 32.77 kg/m.   Wt Readings from Last 3 Encounters:  04/08/24 156 lb 12.8 oz (71.1 kg)  09/26/23 157 lb 7 oz (71.4 kg)  08/27/23 151 lb 4 oz (68.6 kg)    Body mass index is 32.77  kg/m.  Performance status (ECOG): 1 - Symptomatic but completely ambulatory  PHYSICAL EXAM:   GENERAL:alert, no distress and comfortable SKIN: skin color, texture, turgor are normal, no rashes or significant lesions EYES: normal, Conjunctiva are pink and non-injected, sclera clear OROPHARYNX:no exudate, no erythema and lips, buccal mucosa, and tongue normal  NECK: supple, thyroid normal size, non-tender, without nodularity LYMPH:  no palpable lymphadenopathy in the cervical, axillary or inguinal LUNGS: clear to auscultation and percussion with normal breathing effort HEART: regular rate & rhythm and no murmurs and no lower extremity edema ABDOMEN:abdomen soft, non-tender and normal bowel sounds Musculoskeletal:no cyanosis of digits and no clubbing  NEURO: alert & oriented x 3 with fluent speech, no focal motor/sensory deficits BREAST: intact silicone implant on left side with some palpable and tender scar tissue along the inferior, inner and lower quadrant of the left breast. There are no other palpable masses or lumps. The nipple is surgically absent. There is no axillary lymphadenopathy on the left side. There are no palpable lumps or masses in the right breast. There is no nipple inversion or nipple discharge. There is no axillary lymphadenopathy on the right.   LABORATORY DATA:  I  have reviewed the data as listed    Component Value Date/Time   NA 142 04/08/2024 1001   NA 138 05/19/2020 1045   K 3.7 04/08/2024 1001   CL 106 04/08/2024 1001   CO2 29 04/08/2024 1001   GLUCOSE 127 (H) 04/08/2024 1001   BUN 16 04/08/2024 1001   BUN 11 05/19/2020 1045   CREATININE 0.60 04/08/2024 1001   CALCIUM 9.6 04/08/2024 1001   PROT 7.2 04/08/2024 1001   ALBUMIN 4.4 04/08/2024 1001   AST 31 04/08/2024 1001   ALT 43 04/08/2024 1001   ALKPHOS 59 04/08/2024 1001   BILITOT 0.8 04/08/2024 1001   GFRNONAA >60 04/08/2024 1001   GFRAA 124 05/19/2020 1045     Lab Results  Component Value Date    WBC 5.0 04/08/2024   NEUTROABS 3.1 04/08/2024   HGB 16.4 (H) 04/08/2024   HCT 48.4 (H) 04/08/2024   MCV 94.9 04/08/2024   PLT 190 04/08/2024     RADIOGRAPHIC STUDIES: MR BREAST BILATERAL W WO CONTRAST INC CAD Result Date: 04/02/2024 CLINICAL DATA:  58 year old with a personal history of malignant LEFT mastectomy in 2023, pathology invasive lobular carcinoma and lobular neoplasia (LCIS), with implant reconstruction. RIGHT breast augmentation mammoplasty at that time. Adjuvant radiation therapy. Currently undergoing hormonal chemoprevention with exemestane . High-risk MRI evaluation. EXAM: BILATERAL BREAST MRI WITH AND WITHOUT CONTRAST TECHNIQUE: Multiplanar, multisequence MR images of both breasts were obtained prior to and following the intravenous administration of 7 ml of Gadavist . Three-dimensional MR images were rendered by post-processing of the original MR data on an independent workstation. The three-dimensional MR images were interpreted, and findings are reported in the following complete MRI report for this study. Three dimensional images were evaluated at the independent interpreting workstation using the DynaCAD thin client. COMPARISON:  Breast MRI 10/03/2023 and earlier. Prior mammography and breast ultrasounds, most recently screening RIGHT mammography 11/21/2022. FINDINGS: Breast composition: b. Scattered fibroglandular tissue. (RIGHT breast) Background parenchymal enhancement: Mild. RIGHT breast: No suspicious mass or abnormal enhancement. Intact prepectoral silicone implant. LEFT breast: Mastectomy. No evidence of recurrent malignancy. Intact prepectoral silicone implant Lymph nodes: No abnormal appearing lymph nodes. Ancillary findings:  Multiple benign liver cysts. IMPRESSION: 1. No MRI evidence of malignancy involving the RIGHT breast. 2. No evidence of recurrent malignancy in the LEFT mastectomy site. 3. Intact prepectoral silicone implants. RECOMMENDATION: 1. Annual screening RIGHT  mammography which is due currently as the most recent prior mammogram was in April, 2024. 2. Annual or biennial high-risk screening MRI can be considered given the patient's history of lobular cancer and lobular neoplasia. BI-RADS CATEGORY  2: Benign. Electronically Signed   By: Debby Satterfield M.D.   On: 04/02/2024 14:02

## 2024-04-08 ENCOUNTER — Inpatient Hospital Stay: Payer: BC Managed Care – PPO | Attending: Hematology

## 2024-04-08 ENCOUNTER — Encounter: Payer: Self-pay | Admitting: Nurse Practitioner

## 2024-04-08 ENCOUNTER — Inpatient Hospital Stay (HOSPITAL_BASED_OUTPATIENT_CLINIC_OR_DEPARTMENT_OTHER): Payer: BC Managed Care – PPO | Admitting: Nurse Practitioner

## 2024-04-08 VITALS — BP 140/80 | HR 80 | Temp 98.2°F | Resp 17 | Wt 156.8 lb

## 2024-04-08 DIAGNOSIS — Z9012 Acquired absence of left breast and nipple: Secondary | ICD-10-CM | POA: Insufficient documentation

## 2024-04-08 DIAGNOSIS — Z79811 Long term (current) use of aromatase inhibitors: Secondary | ICD-10-CM | POA: Insufficient documentation

## 2024-04-08 DIAGNOSIS — C50412 Malignant neoplasm of upper-outer quadrant of left female breast: Secondary | ICD-10-CM | POA: Diagnosis present

## 2024-04-08 DIAGNOSIS — Z170 Estrogen receptor positive status [ER+]: Secondary | ICD-10-CM | POA: Diagnosis not present

## 2024-04-08 LAB — CBC WITH DIFFERENTIAL (CANCER CENTER ONLY)
Abs Immature Granulocytes: 0.02 K/uL (ref 0.00–0.07)
Basophils Absolute: 0.1 K/uL (ref 0.0–0.1)
Basophils Relative: 1 %
Eosinophils Absolute: 0.1 K/uL (ref 0.0–0.5)
Eosinophils Relative: 2 %
HCT: 48.4 % — ABNORMAL HIGH (ref 36.0–46.0)
Hemoglobin: 16.4 g/dL — ABNORMAL HIGH (ref 12.0–15.0)
Immature Granulocytes: 0 %
Lymphocytes Relative: 27 %
Lymphs Abs: 1.3 K/uL (ref 0.7–4.0)
MCH: 32.2 pg (ref 26.0–34.0)
MCHC: 33.9 g/dL (ref 30.0–36.0)
MCV: 94.9 fL (ref 80.0–100.0)
Monocytes Absolute: 0.4 K/uL (ref 0.1–1.0)
Monocytes Relative: 8 %
Neutro Abs: 3.1 K/uL (ref 1.7–7.7)
Neutrophils Relative %: 62 %
Platelet Count: 190 K/uL (ref 150–400)
RBC: 5.1 MIL/uL (ref 3.87–5.11)
RDW: 12.4 % (ref 11.5–15.5)
WBC Count: 5 K/uL (ref 4.0–10.5)
nRBC: 0 % (ref 0.0–0.2)

## 2024-04-08 LAB — CMP (CANCER CENTER ONLY)
ALT: 43 U/L (ref 0–44)
AST: 31 U/L (ref 15–41)
Albumin: 4.4 g/dL (ref 3.5–5.0)
Alkaline Phosphatase: 59 U/L (ref 38–126)
Anion gap: 7 (ref 5–15)
BUN: 16 mg/dL (ref 6–20)
CO2: 29 mmol/L (ref 22–32)
Calcium: 9.6 mg/dL (ref 8.9–10.3)
Chloride: 106 mmol/L (ref 98–111)
Creatinine: 0.6 mg/dL (ref 0.44–1.00)
GFR, Estimated: >60 mL/min (ref >60)
Glucose, Bld: 127 mg/dL — ABNORMAL HIGH (ref 70–99)
Potassium: 3.7 mmol/L (ref 3.5–5.1)
Sodium: 142 mmol/L (ref 135–145)
Total Bilirubin: 0.8 mg/dL (ref 0.0–1.2)
Total Protein: 7.2 g/dL (ref 6.5–8.1)

## 2024-04-09 ENCOUNTER — Ambulatory Visit: Payer: Self-pay

## 2024-04-09 DIAGNOSIS — R293 Abnormal posture: Secondary | ICD-10-CM | POA: Diagnosis not present

## 2024-04-09 DIAGNOSIS — M6281 Muscle weakness (generalized): Secondary | ICD-10-CM

## 2024-04-09 DIAGNOSIS — R279 Unspecified lack of coordination: Secondary | ICD-10-CM

## 2024-04-09 LAB — CANCER ANTIGEN 27.29: CA 27.29: 29.8 U/mL (ref 0.0–38.6)

## 2024-04-09 NOTE — Therapy (Addendum)
 OUTPATIENT PHYSICAL THERAPY FEMALE PELVIC TREATMENT   Patient Name: Autumn Wyatt MRN: [id number] DOB:1966/04/25, 58 y.o., female Today's Date: 04/09/2024  END OF SESSION:  PT End of Session - 04/09/24 1145     Visit Number 4    Date for PT Re-Evaluation 08/06/24    Authorization Type [employer]    Authorization Time Period 02/20/2024-04/19/2024    Authorization - Visit Number 3    Authorization - Number of Visits 5    PT Start Time 1145    PT Stop Time 1225    PT Time Calculation (min) 40 min    Activity Tolerance Patient tolerated treatment well    Behavior During Therapy [facility] for tasks assessed/performed            Past Medical History:  Diagnosis Date   Abnormal pap    Allergy    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, LCIS   DIAPHORESIS 12/27/2009   Qualifier: Diagnosis of  By: Gwenn Grimes     Hypertension    Palpitations 12/27/2009   no current problems, Qualifier: Diagnosis of  By: Barnes, Kimalexis   SUPRAVENTRICULAR TACHYCARDIA 05/21/2020   had ablation - Qualifier: Diagnosis of  By: Gwenn Grimes   Past Surgical History:  Procedure Laterality Date   BREAST BIOPSY Left 12/14/2021   BREAST ENHANCEMENT SURGERY Right 01/19/2023   Procedure: MAMMOPLASTY AUGMENTATION (BREAST);  Surgeon: Arelia Filippo, MD;  Location: [facility];  Service: Plastics;  Laterality: Right;   BREAST RECONSTRUCTION WITH PLACEMENT OF TISSUE EXPANDER AND ALLODERM Left 01/31/2022   Procedure: BREAST RECONSTRUCTION WITH PLACEMENT OF TISSUE EXPANDER AND ALLODERM;  Surgeon: Arelia Filippo, MD;  Location: [facility];  Service: Plastics;  Laterality: Left;   CARPAL TUNNEL WITH CUBITAL TUNNEL Bilateral    EYE SURGERY  2006   INCISION AND DRAINAGE OF WOUND Left 02/18/2022   Procedure: IRRIGATION AND DEBRIDEMENT WOUND-LEFT BREAST;  Surgeon: Arelia Filippo, MD;  Location: [facility];  Service: Plastics;  Laterality: Left;   KNEE SURGERY      MASTECTOMY W/ SENTINEL NODE BIOPSY Left 01/31/2022   Procedure: LEFT NIPPLE SPARING MASTECTOMY AND SENTINEL LYMPH NODE BIOPSY;  Surgeon: Vernetta Berg, MD;  Location: [facility];  Service: General;  Laterality: Left;   REFRACTIVE SURGERY     REMOVAL OF TISSUE EXPANDER AND PLACEMENT OF IMPLANT Left 01/19/2023   Procedure: REMOVAL OF TISSUE EXPANDER AND PLACEMENT OF IMPLANT;  Surgeon: Arelia Filippo, MD;  Location: [facility];  Service: Plastics;  Laterality: Left;   SVT ABLATION N/A 05/21/2020   Procedure: SVT ABLATION;  Surgeon: Cindie Ole DASEN, MD;  Location: [facility];  Service: Cardiovascular;  Laterality: N/A;   TISSUE EXPANDER PLACEMENT Left 03/24/2022   Procedure: TISSUE EXPANDER - REMOVAL AND REPLACEMENT;  Surgeon: Arelia Filippo, MD;  Location: [facility];  Service: Plastics;  Laterality: Left;   TRIGGER FINGER RELEASE Right    TRIGGER FINGER RELEASE Left 01/15/2023   at [facility]   WISDOM TOOTH EXTRACTION     Patient Active Problem List   Diagnosis Date Noted   Osteopenia after menopause 12/14/2022   Breast cancer, left breast (HCC) 01/31/2022   Malignant neoplasm of upper-outer quadrant of left breast in female, estrogen receptor positive (HCC) 12/26/2021   SVT (supraventricular tachycardia) (HCC) 12/27/2009   DIAPHORESIS 12/27/2009   PALPITATIONS 12/27/2009   SHORTNESS OF BREATH 12/27/2009    PCP: Darcel Pool, MD   REFERRING PROVIDER: Rivard,  Nena, MD   REFERRING DIAG: N39.46 (ICD-10-CM) - Mixed incontinence  THERAPY DIAG:  Abnormal posture  Muscle weakness (generalized)  Unspecified lack of coordination  Rationale for Evaluation and Treatment: Rehabilitation  ONSET DATE: 5-6 years ago (2019)  SUBJECTIVE:                                                                                                                                                                                            SUBJECTIVE STATEMENT: Pt states that she has not been doing exercises as much. She leaks quite a bit during dead bug exercise. She feels like she is 30-40% better.    PAIN:  Are you having pain? No  PRECAUTIONS: Other: history of breast cancer  RED FLAGS: None   WEIGHT BEARING RESTRICTIONS: No  FALLS:  Has patient fallen in last 6 months? No  OCCUPATION: not working  ACTIVITY LEVEL : no exercise currently   PLOF: Independent  PATIENT GOALS: to be able to travel without worrying about bladder   PERTINENT HISTORY:  Breast cancer 2023, mastectomy Lt 2023, necrosis formed and removed after mastectomy, third surgery for first reconstruction, second surgery for reconstruction, radiation Sexual abuse: No  BOWEL MOVEMENT: Pain with bowel movement: No Type of bowel movement:Frequency 1x/day and Strain strain Fully empty rectum: Yes:   Leakage: No Pads: Yes: see below Fiber supplement/laxative No  URINATION: Pain with urination: No Fully empty bladder: Yes:   Stream: Strong Urgency: Yes  Frequency: 8-10x/day; 1x/night Fluid Intake: tries to limit due to incontinence Leakage: Urge to void, Walking to the bathroom, Coughing, Sneezing, Laughing, and just sitting without reason Pads: Yes: heavy poise pads (3) x2  INTERCOURSE:  Ability to have vaginal penetration Yes  Pain with intercourse: none Dryness Yes  Climax: WNL Marinoff Scale: 0/3 Lubricant: sometimes   PREGNANCY: Vaginal deliveries 2 Tearing Yes: small Episiotomy No C-section deliveries 0 Currently pregnant No  PROLAPSE: None   OBJECTIVE:  Note: Objective measures were completed at Evaluation unless otherwise noted. 04/09/24: PFIQ-7: 52 Trampoline bouncing: causes some leaking Good transversus abdominus contraction with exhale Requires verbal cues to reduce breath holding in more difficult exercises such as lateral lunges and sumo squats More difficulty achieving pelvic floor muscle contraction  in wide base of support     02/20/24: PATIENT SURVEYS:   PFIQ-7: 67  COGNITION: Overall cognitive status: Within functional limits for tasks assessed     SENSATION: Light touch: Appears intact   FUNCTIONAL TESTS:  Squat: WNL Single leg stance:  Rt: pelvic drop  Lt: pelvic drop Curl-up test: abdominal distortion throughout upper and lower   GAIT: Assistive device  utilized: None Comments: WNL  POSTURE: rounded shoulders, forward head, decreased lumbar lordosis, increased thoracic kyphosis, and posterior pelvic tilt   LUMBARAROM/PROM:  A/PROM A/PROM  Eval (% available)  Flexion 100  Extension 75  Right lateral flexion 75  Left lateral flexion 75  Right rotation 75  Left rotation 75   (Blank rows = not tested)  PALPATION:   General: Lt upper chest scar tissue restriction  Pelvic Alignment: posterior pelvic tilt  Abdominal: tension throughout abdomen                External Perineal Exam: Lt labial whiteness in tissue starting between clitoral hood and labia majora descending down to labia minor/major attachment - slightly tender - pt shown with mirror                             Internal Pelvic Floor: mild atrophy, no tenderness  Patient confirms identification and approves PT to assess internal pelvic floor and treatment Yes  PELVIC MMT:   MMT eval  Vaginal 2/5, 4 second hold, 7 repeat contractions  Diastasis Recti 3 finger width diastasis   (Blank rows = not tested)        TONE: low  PROLAPSE: WNL  TODAY'S TREATMENT:                                                                                                                              DATE:  04/09/24 Neuromuscular re-education: Modified dead bug making sure she leaves one of her feet down to help support and manage pressure better 10x bil Trampoline exercises: Side to side bouncing 2 min Staggered stance bouncing 2 min bil Straight up and down bouncing 2 min Therapeutic  activities: Lateral lunges in wide stance 10x bil 5 lbs  Sumo Squat 2 x 10 15 lb kettle bell Staggered stance unilateral row with rotation 7 lbs on pulley 2 x 10 bil Pallof press red band 2 x 10 bil Bil shoulder extensions red band 2 x 10    03/24/24 Neuromuscular re-education: Bridge with hip adduction, transversus abdominus, and pelvic floor muscle 2 x 10 Modified dead bug 10x bil Supine full shoulder flexion with 8 lbs 2 x 10 Seated hip adduction ball press with transversus abdominus and pelvic floor muscle 2 x 10 Seated hip abduction red band with transversus abdominus and pelvic floor muscle 2 x 10 Therapeutic activities: Squats with cues for pelvic floor muscle contraction and breath coordination - to table 2 x 10 Staggered dead lift with rotation and unilateral 8 lb weight 10x bil Pallof press red band 2 x 10 bil Bil shoulder extensions red band 2 x 10 Standing 3 way kick red band 10x each, bil Resisted side steps 5 x 10 red band   02/28/24 Neuromuscular re-education: Transversus abdominus training with multimodal cues for improved motor control and breath coordination Bil supine UE ball press with transversus abdominus  and pelvic floor muscle contractions and breath coordination 10x Supine hip adduction ball press with transversus abdominus and pelvic floor muscle contractions and breath coordination 10x Bridge with hip adduction, transversus abdominus, and pelvic floor muscle 2 x 10 Unilateral side lying UE ball press in clam shell with transversus abdominus and pelvic floor muscle contractions and breath coordination 10x Supine leg extensions 10x bil Supine shoulder flexion with weight 5lbs 10x Pelvic floor muscle contraction with bil thigh press 10x Squats with cues for pelvic floor muscle contraction and breath coordination - to table 10x     PATIENT EDUCATION:  Education details: See above Person educated: Patient Education method: Programmer, multimedia, Facilities manager,  Actor cues, Verbal cues, and Handouts Education comprehension: verbalized understanding  HOME EXERCISE PROGRAM: [id number]  ASSESSMENT:  CLINICAL IMPRESSION: Pt states that she was having a lot of leaking with dead bug; when we performed exercise together, she had both LE elevated which was causing her to bear down, putting pressure on bladder. She felt much better with one LE remaining on the table to help support her and allow her to achieve better intra-abdominal management. We progressed exercises to include gentle impact training on trampoline; she is still leaking with lateral movements with this impact she also could not perform heel slams without leaking so we did not add these today. She reports overall 30-40% progress, but is still using 2 pads a day. Her PFIQ-7 score has reduced to 52 from 67, demonstrating good functional improvement in bladder control. She does still benefit from cues to help decrease breath holding during more challenging activities, which is probably why she leaks in many of these activities. She will continue to benefit from skilled PT intervention in order to decrease urinary urgency/incontinence an distress incontinence, address all impairments, improve quality of life, and progress functional strengthening program.   OBJECTIVE IMPAIRMENTS: decreased activity tolerance, decreased coordination, decreased endurance, decreased mobility, decreased ROM, decreased strength, increased fascial restrictions, increased muscle spasms, impaired flexibility, impaired tone, improper body mechanics, postural dysfunction, and pain.   ACTIVITY LIMITATIONS: lifting, bending, and continence  PARTICIPATION LIMITATIONS: cleaning, laundry, interpersonal relationship, and community activity  PERSONAL FACTORS: 1 comorbidity: medical history are also affecting patient's functional outcome.   REHAB POTENTIAL: Good  CLINICAL DECISION MAKING: Stable/uncomplicated  EVALUATION COMPLEXITY:  Low   GOALS: Goals reviewed with patient? Yes  SHORT TERM GOALS: Updated 04/09/24   Pt will be independent with HEP.   Baseline: Goal status: Met 03/24/24  2.  Pt will report 25% improvement in urinary incontinence in order to decrease anxiety with travel.  Baseline: 30-40% better Goal status: In progress 04/09/24  3.  Pt will be able to teach back and utilize urge suppression technique in order to help reduce number of trips to the bathroom.    Baseline:  Goal status: Met 03/24/24  4.  Pt will be independent with the knack in order to reduce stress urinary incontinence.  Baseline:  Goal status: Met 03/24/24  5.  Pt will increase pelvic floor muscle strength to 3/5 in order to reduce urinary incontinence with laughing, coughing, and sneezing.  Baseline: not formally assessed today Goal status: In progress 04/09/24   LONG TERM GOALS: Updated 04/09/24  Pt will be independent with advanced HEP.   Baseline:  Goal status: In progress 04/09/24  2.  Pt will report 75% improvement in urinary incontinence in order to decrease anxiety with travel.  Baseline: 30-40% - PFIQ-7 improved from 67 to 52 Goal status: In progress 04/09/24  3.  Pt will be able to go 2-3 hours in between voids without urgency or incontinence in order to improve QOL and perform all functional activities with less difficulty.   Baseline: every 30 minutes in the morning still, but 3-4 hours in the afternoon Goal status: In progress 04/09/24  4.  Pt will increase pelvic floor muscle endurance to greater than 10 seconds in order to decrease urinary incontinence what laughing, coughing, and sneezing.  Baseline: not formally assessed today Goal status: In progress 04/09/24  5.  Pt will report use of no more than one light pad a day. Baseline: still using 2 a day Goal status: In progress 04/09/24  6.  Pt will report no apprehension with traveling due to fear of finding bathroom or leaking.  Baseline:  Goal status:  In progress 04/09/24  PLAN:  PT FREQUENCY: 1-2x/week  PT DURATION: 6 months   PLANNED INTERVENTIONS: 97110-Therapeutic exercises, 97530- Therapeutic activity, 97112- Neuromuscular re-education, 97535- Self Care, [id number]- Manual therapy, Dry Needling, and Biofeedback  PLAN FOR NEXT SESSION: begin core training and progress into more challenging functional strengthening exercises.    Josette Mares, PT, DPT08/27/2512:28 PM

## 2024-04-28 ENCOUNTER — Ambulatory Visit

## 2024-05-12 ENCOUNTER — Ambulatory Visit: Attending: Obstetrics and Gynecology

## 2024-05-12 DIAGNOSIS — M6281 Muscle weakness (generalized): Secondary | ICD-10-CM | POA: Insufficient documentation

## 2024-05-12 DIAGNOSIS — R293 Abnormal posture: Secondary | ICD-10-CM | POA: Diagnosis present

## 2024-05-12 DIAGNOSIS — R279 Unspecified lack of coordination: Secondary | ICD-10-CM | POA: Diagnosis present

## 2024-05-12 NOTE — Therapy (Signed)
 OUTPATIENT PHYSICAL THERAPY FEMALE PELVIC TREATMENT   Patient Name: Autumn Wyatt MRN: [id number] DOB:Jul 10, 1966, 58 y.o., female Today's Date: 05/12/2024  END OF SESSION:  PT End of Session - 05/12/24 1019     Visit Number 5    Date for Recertification  08/06/24    Authorization Type [employer]    Authorization Time Period 04/09/2024 - 06/07/2024    Authorization - Visit Number 1    Authorization - Number of Visits 6    PT Start Time 1017    PT Stop Time 1057    PT Time Calculation (min) 40 min    Activity Tolerance Patient tolerated treatment well    Behavior During Therapy [facility] for tasks assessed/performed            Past Medical History:  Diagnosis Date   Abnormal pap    Allergy    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, LCIS   DIAPHORESIS 12/27/2009   Qualifier: Diagnosis of  By: Gwenn Grimes     Hypertension    Palpitations 12/27/2009   no current problems, Qualifier: Diagnosis of  By: Barnes, [employer]   SUPRAVENTRICULAR TACHYCARDIA 05/21/2020   had ablation - Qualifier: Diagnosis of  By: Gwenn Grimes   Past Surgical History:  Procedure Laterality Date   BREAST BIOPSY Left 12/14/2021   BREAST ENHANCEMENT SURGERY Right 01/19/2023   Procedure: MAMMOPLASTY AUGMENTATION (BREAST);  Surgeon: Arelia Filippo, MD;  Location: [facility];  Service: Plastics;  Laterality: Right;   BREAST RECONSTRUCTION WITH PLACEMENT OF TISSUE EXPANDER AND ALLODERM Left 01/31/2022   Procedure: BREAST RECONSTRUCTION WITH PLACEMENT OF TISSUE EXPANDER AND ALLODERM;  Surgeon: Arelia Filippo, MD;  Location: [facility];  Service: Plastics;  Laterality: Left;   CARPAL TUNNEL WITH CUBITAL TUNNEL Bilateral    EYE SURGERY  2006   INCISION AND DRAINAGE OF WOUND Left 02/18/2022   Procedure: IRRIGATION AND DEBRIDEMENT WOUND-LEFT BREAST;  Surgeon: Arelia Filippo, MD;  Location: [facility];  Service: Plastics;  Laterality: Left;   KNEE SURGERY      MASTECTOMY W/ SENTINEL NODE BIOPSY Left 01/31/2022   Procedure: LEFT NIPPLE SPARING MASTECTOMY AND SENTINEL LYMPH NODE BIOPSY;  Surgeon: Vernetta Berg, MD;  Location: [facility];  Service: General;  Laterality: Left;   REFRACTIVE SURGERY     REMOVAL OF TISSUE EXPANDER AND PLACEMENT OF IMPLANT Left 01/19/2023   Procedure: REMOVAL OF TISSUE EXPANDER AND PLACEMENT OF IMPLANT;  Surgeon: Arelia Filippo, MD;  Location: [facility];  Service: Plastics;  Laterality: Left;   SVT ABLATION N/A 05/21/2020   Procedure: SVT ABLATION;  Surgeon: Cindie Ole DASEN, MD;  Location: [facility];  Service: Cardiovascular;  Laterality: N/A;   TISSUE EXPANDER PLACEMENT Left 03/24/2022   Procedure: TISSUE EXPANDER - REMOVAL AND REPLACEMENT;  Surgeon: Arelia Filippo, MD;  Location: [facility];  Service: Plastics;  Laterality: Left;   TRIGGER FINGER RELEASE Right    TRIGGER FINGER RELEASE Left 01/15/2023   at [facility]   WISDOM TOOTH EXTRACTION     Patient Active Problem List   Diagnosis Date Noted   Osteopenia after menopause 12/14/2022   Breast cancer, left breast (HCC) 01/31/2022   Malignant neoplasm of upper-outer quadrant of left breast in female, estrogen receptor positive (HCC) 12/26/2021   SVT (supraventricular tachycardia) (HCC) 12/27/2009   DIAPHORESIS 12/27/2009   PALPITATIONS 12/27/2009   SHORTNESS OF BREATH 12/27/2009    PCP: Darcel Pool, MD   REFERRING  PROVIDER: Darcel Pool, MD   REFERRING DIAG: N39.46 (ICD-10-CM) - Mixed incontinence  THERAPY DIAG:  Abnormal posture  Muscle weakness (generalized)  Unspecified lack of coordination  Rationale for Evaluation and Treatment: Rehabilitation  ONSET DATE: 5-6 years ago (2019)  SUBJECTIVE:                                                                                                                                                                                            SUBJECTIVE STATEMENT: Pt doing well overall and states that she continues to feel like she is making progress.    PAIN:  Are you having pain? No  PRECAUTIONS: Other: history of breast cancer  RED FLAGS: None   WEIGHT BEARING RESTRICTIONS: No  FALLS:  Has patient fallen in last 6 months? No  OCCUPATION: not working  ACTIVITY LEVEL : no exercise currently   PLOF: Independent  PATIENT GOALS: to be able to travel without worrying about bladder   PERTINENT HISTORY:  Breast cancer 2023, mastectomy Lt 2023, necrosis formed and removed after mastectomy, third surgery for first reconstruction, second surgery for reconstruction, radiation Sexual abuse: No  BOWEL MOVEMENT: Pain with bowel movement: No Type of bowel movement:Frequency 1x/day and Strain strain Fully empty rectum: Yes:   Leakage: No Pads: Yes: see below Fiber supplement/laxative No  URINATION: Pain with urination: No Fully empty bladder: Yes:   Stream: Strong Urgency: Yes  Frequency: 8-10x/day; 1x/night Fluid Intake: tries to limit due to incontinence Leakage: Urge to void, Walking to the bathroom, Coughing, Sneezing, Laughing, and just sitting without reason Pads: Yes: heavy poise pads (3) x2  INTERCOURSE:  Ability to have vaginal penetration Yes  Pain with intercourse: none Dryness Yes  Climax: WNL Marinoff Scale: 0/3 Lubricant: sometimes   PREGNANCY: Vaginal deliveries 2 Tearing Yes: small Episiotomy No C-section deliveries 0 Currently pregnant No  PROLAPSE: None   OBJECTIVE:  Note: Objective measures were completed at Evaluation unless otherwise noted. 04/09/24: PFIQ-7: 52 Trampoline bouncing: causes some leaking Good transversus abdominus contraction with exhale Requires verbal cues to reduce breath holding in more difficult exercises such as lateral lunges and sumo squats More difficulty achieving pelvic floor muscle contraction in wide base of support     02/20/24: PATIENT  SURVEYS:   PFIQ-7: 67  COGNITION: Overall cognitive status: Within functional limits for tasks assessed     SENSATION: Light touch: Appears intact   FUNCTIONAL TESTS:  Squat: WNL Single leg stance:  Rt: pelvic drop  Lt: pelvic drop Curl-up test: abdominal distortion throughout upper and lower   GAIT: Assistive device utilized: None Comments: WNL  POSTURE: rounded shoulders, forward  head, decreased lumbar lordosis, increased thoracic kyphosis, and posterior pelvic tilt   LUMBARAROM/PROM:  A/PROM A/PROM  Eval (% available)  Flexion 100  Extension 75  Right lateral flexion 75  Left lateral flexion 75  Right rotation 75  Left rotation 75   (Blank rows = not tested)  PALPATION:   General: Lt upper chest scar tissue restriction  Pelvic Alignment: posterior pelvic tilt  Abdominal: tension throughout abdomen                External Perineal Exam: Lt labial whiteness in tissue starting between clitoral hood and labia majora descending down to labia minor/major attachment - slightly tender - pt shown with mirror                             Internal Pelvic Floor: mild atrophy, no tenderness  Patient confirms identification and approves PT to assess internal pelvic floor and treatment Yes  PELVIC MMT:   MMT eval  Vaginal 2/5, 4 second hold, 7 repeat contractions  Diastasis Recti 3 finger width diastasis   (Blank rows = not tested)        TONE: low  PROLAPSE: WNL  TODAY'S TREATMENT:                                                                                                                              DATE:  05/12/24 Neuromuscular re-education: Trampoline exercises: Side to side bouncing 2 min Staggered stance bouncing 2 min bil Straight up and down bouncing 2 min Wall ball marching + yellow ball 2 x 20 Standing chop +10 lbs 2 x 10 bil Standing reverse chop + 7lbs 2 x 10 bil Therapeutic activities: Sumo Squat 2 x 10 15 lb kettle bell Single leg dead  lift with row + 10lbs 10x bil Forward/backward walk outs + 7lbs bil 10x Running man 2 x 10 Pallof press + rotation + green band 10x bil   04/09/24 Neuromuscular re-education: Modified dead bug making sure she leaves one of her feet down to help support and manage pressure better 10x bil Trampoline exercises: Side to side bouncing 2 min Staggered stance bouncing 2 min bil Straight up and down bouncing 2 min Therapeutic activities: Lateral lunges in wide stance 10x bil 5 lbs  Sumo Squat 2 x 10 15 lb kettle bell Staggered stance unilateral row with rotation 7 lbs on pulley 2 x 10 bil Pallof press red band 2 x 10 bil Bil shoulder extensions red band 2 x 10    03/24/24 Neuromuscular re-education: Bridge with hip adduction, transversus abdominus, and pelvic floor muscle 2 x 10 Modified dead bug 10x bil Supine full shoulder flexion with 8 lbs 2 x 10 Seated hip adduction ball press with transversus abdominus and pelvic floor muscle 2 x 10 Seated hip abduction red band with transversus abdominus and pelvic floor muscle 2 x 10 Therapeutic activities: Squats with cues  for pelvic floor muscle contraction and breath coordination - to table 2 x 10 Staggered dead lift with rotation and unilateral 8 lb weight 10x bil Pallof press red band 2 x 10 bil Bil shoulder extensions red band 2 x 10 Standing 3 way kick red band 10x each, bil Resisted side steps 5 x 10 red band   PATIENT EDUCATION:  Education details: See above Person educated: Patient Education method: Explanation, Demonstration, Tactile cues, Verbal cues, and Handouts Education comprehension: verbalized understanding  HOME EXERCISE PROGRAM: [id number]  ASSESSMENT:  CLINICAL IMPRESSION: Pt making progress with bladder control. Her foot is feeling better as well and not disruptful in exercises. She was able to continue working on pelvic floor muscle contractions on trampoline and progress functional strengthening activities. She  does require some moderate cuing for improved core support and stability with breathing throughout exercises. She will continue to benefit from skilled PT intervention in order to decrease urinary urgency/incontinence an distress incontinence, address all impairments, improve quality of life, and progress functional strengthening program.   OBJECTIVE IMPAIRMENTS: decreased activity tolerance, decreased coordination, decreased endurance, decreased mobility, decreased ROM, decreased strength, increased fascial restrictions, increased muscle spasms, impaired flexibility, impaired tone, improper body mechanics, postural dysfunction, and pain.   ACTIVITY LIMITATIONS: lifting, bending, and continence  PARTICIPATION LIMITATIONS: cleaning, laundry, interpersonal relationship, and community activity  PERSONAL FACTORS: 1 comorbidity: medical history are also affecting patient's functional outcome.   REHAB POTENTIAL: Good  CLINICAL DECISION MAKING: Stable/uncomplicated  EVALUATION COMPLEXITY: Low   GOALS: Goals reviewed with patient? Yes  SHORT TERM GOALS: Updated 05/12/24   Pt will be independent with HEP.   Baseline: Goal status: Met 03/24/24  2.  Pt will report 25% improvement in urinary incontinence in order to decrease anxiety with travel.  Baseline: 30-40% better Goal status: In progress 04/09/24  3.  Pt will be able to teach back and utilize urge suppression technique in order to help reduce number of trips to the bathroom.    Baseline:  Goal status: Met 03/24/24  4.  Pt will be independent with the knack in order to reduce stress urinary incontinence.  Baseline:  Goal status: Met 03/24/24  5.  Pt will increase pelvic floor muscle strength to 3/5 in order to reduce urinary incontinence with laughing, coughing, and sneezing.  Baseline: not formally assessed today Goal status: In progress 05/12/24   LONG TERM GOALS: Updated 05/12/24  Pt will be independent with advanced HEP.    Baseline:  Goal status: In progress 05/12/24  2.  Pt will report 75% improvement in urinary incontinence in order to decrease anxiety with travel.  Baseline: 30-40% - PFIQ-7 improved from 67 to 52 Goal status: In progress 05/12/24  3.  Pt will be able to go 2-3 hours in between voids without urgency or incontinence in order to improve QOL and perform all functional activities with less difficulty.   Baseline: every 30 minutes in the morning still, but 3-4 hours in the afternoon Goal status: In progress 05/12/24  4.  Pt will increase pelvic floor muscle endurance to greater than 10 seconds in order to decrease urinary incontinence what laughing, coughing, and sneezing.  Baseline: not formally assessed today Goal status: In progress 05/12/24  5.  Pt will report use of no more than one light pad a day. Baseline: still using 2 a day, but feels like there is less in the pads Goal status: In progress 05/12/24  6.  Pt will report no apprehension with traveling due  to fear of finding bathroom or leaking.  Baseline:  Goal status: MET 05/12/24  PLAN:  PT FREQUENCY: 1-2x/week  PT DURATION: 6 months   PLANNED INTERVENTIONS: 97110-Therapeutic exercises, 97530- Therapeutic activity, 97112- Neuromuscular re-education, 97535- Self Care, [id number]- Manual therapy, Dry Needling, and Biofeedback  PLAN FOR NEXT SESSION: progress core training and progress into more challenging functional strengthening exercises   Josette Mares, PT, DPT09/29/2510:54 AM

## 2024-05-19 ENCOUNTER — Ambulatory Visit: Attending: Obstetrics and Gynecology

## 2024-05-19 DIAGNOSIS — R293 Abnormal posture: Secondary | ICD-10-CM | POA: Diagnosis present

## 2024-05-19 DIAGNOSIS — M6281 Muscle weakness (generalized): Secondary | ICD-10-CM | POA: Diagnosis present

## 2024-05-19 DIAGNOSIS — R279 Unspecified lack of coordination: Secondary | ICD-10-CM | POA: Insufficient documentation

## 2024-05-19 NOTE — Therapy (Signed)
 OUTPATIENT PHYSICAL THERAPY FEMALE PELVIC TREATMENT   Patient Name: Autumn Wyatt MRN: [id number] DOB:Dec 23, 1965, 58 y.o., female Today's Date: 05/19/2024  END OF SESSION:  PT End of Session - 05/19/24 1015     Visit Number 6    Date for Recertification  08/06/24    Authorization Type [employer]    Authorization Time Period 04/09/2024 - 06/07/2024    Authorization - Visit Number 2    Authorization - Number of Visits 6    PT Start Time 1015    PT Stop Time 1055    PT Time Calculation (min) 40 min    Activity Tolerance Patient tolerated treatment well    Behavior During Therapy [facility] for tasks assessed/performed            Past Medical History:  Diagnosis Date   Abnormal pap    Allergy    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, LCIS   DIAPHORESIS 12/27/2009   Qualifier: Diagnosis of  By: Gwenn Grimes     Hypertension    Palpitations 12/27/2009   no current problems, Qualifier: Diagnosis of  By: Barnes, Kimalexis   SUPRAVENTRICULAR TACHYCARDIA 05/21/2020   had ablation - Qualifier: Diagnosis of  By: Gwenn Grimes   Past Surgical History:  Procedure Laterality Date   BREAST BIOPSY Left 12/14/2021   BREAST ENHANCEMENT SURGERY Right 01/19/2023   Procedure: MAMMOPLASTY AUGMENTATION (BREAST);  Surgeon: Arelia Filippo, MD;  Location: [facility];  Service: Plastics;  Laterality: Right;   BREAST RECONSTRUCTION WITH PLACEMENT OF TISSUE EXPANDER AND ALLODERM Left 01/31/2022   Procedure: BREAST RECONSTRUCTION WITH PLACEMENT OF TISSUE EXPANDER AND ALLODERM;  Surgeon: Arelia Filippo, MD;  Location: [facility];  Service: Plastics;  Laterality: Left;   CARPAL TUNNEL WITH CUBITAL TUNNEL Bilateral    EYE SURGERY  2006   INCISION AND DRAINAGE OF WOUND Left 02/18/2022   Procedure: IRRIGATION AND DEBRIDEMENT WOUND-LEFT BREAST;  Surgeon: Arelia Filippo, MD;  Location: [facility];  Service: Plastics;  Laterality: Left;   KNEE SURGERY      MASTECTOMY W/ SENTINEL NODE BIOPSY Left 01/31/2022   Procedure: LEFT NIPPLE SPARING MASTECTOMY AND SENTINEL LYMPH NODE BIOPSY;  Surgeon: Vernetta Berg, MD;  Location: [facility];  Service: General;  Laterality: Left;   REFRACTIVE SURGERY     REMOVAL OF TISSUE EXPANDER AND PLACEMENT OF IMPLANT Left 01/19/2023   Procedure: REMOVAL OF TISSUE EXPANDER AND PLACEMENT OF IMPLANT;  Surgeon: Arelia Filippo, MD;  Location: [facility];  Service: Plastics;  Laterality: Left;   SVT ABLATION N/A 05/21/2020   Procedure: SVT ABLATION;  Surgeon: Cindie Ole DASEN, MD;  Location: [facility] INVASIVE CV LAB;  Service: Cardiovascular;  Laterality: N/A;   TISSUE EXPANDER PLACEMENT Left 03/24/2022   Procedure: TISSUE EXPANDER - REMOVAL AND REPLACEMENT;  Surgeon: Arelia Filippo, MD;  Location:  SURGERY CENTER;  Service: Plastics;  Laterality: Left;   TRIGGER FINGER RELEASE Right    TRIGGER FINGER RELEASE Left 01/15/2023   [facility]   WISDOM TOOTH EXTRACTION     Patient Active Problem List   Diagnosis Date Noted   Osteopenia after menopause 12/14/2022   Breast cancer, left breast (HCC) 01/31/2022   Malignant neoplasm of upper-outer quadrant of left breast in female, estrogen receptor positive (HCC) 12/26/2021   SVT (supraventricular tachycardia) (HCC) 12/27/2009   DIAPHORESIS 12/27/2009   PALPITATIONS 12/27/2009   SHORTNESS OF BREATH 12/27/2009    PCP: Darcel Pool, MD   REFERRING  PROVIDER: Darcel Pool, MD   REFERRING DIAG: N39.46 (ICD-10-CM) - Mixed incontinence  THERAPY DIAG:  Abnormal posture  Muscle weakness (generalized)  Unspecified lack of coordination  Rationale for Evaluation and Treatment: Rehabilitation  ONSET DATE: 5-6 years ago (2019)  SUBJECTIVE:                                                                                                                                                                                            SUBJECTIVE STATEMENT: Pt reports no changes with bladder. She feels like exercises are going well and she is doing them more frequently. She is still having some off and on foot pain.    PAIN:  Are you having pain? No  PRECAUTIONS: Other: history of breast cancer  RED FLAGS: None   WEIGHT BEARING RESTRICTIONS: No  FALLS:  Has patient fallen in last 6 months? No  OCCUPATION: not working  ACTIVITY LEVEL : no exercise currently   PLOF: Independent  PATIENT GOALS: to be able to travel without worrying about bladder   PERTINENT HISTORY:  Breast cancer 2023, mastectomy Lt 2023, necrosis formed and removed after mastectomy, third surgery for first reconstruction, second surgery for reconstruction, radiation Sexual abuse: No  BOWEL MOVEMENT: Pain with bowel movement: No Type of bowel movement:Frequency 1x/day and Strain strain Fully empty rectum: Yes:   Leakage: No Pads: Yes: see below Fiber supplement/laxative No  URINATION: Pain with urination: No Fully empty bladder: Yes:   Stream: Strong Urgency: Yes  Frequency: 8-10x/day; 1x/night Fluid Intake: tries to limit due to incontinence Leakage: Urge to void, Walking to the bathroom, Coughing, Sneezing, Laughing, and just sitting without reason Pads: Yes: heavy poise pads (3) x2  INTERCOURSE:  Ability to have vaginal penetration Yes  Pain with intercourse: none Dryness Yes  Climax: WNL Marinoff Scale: 0/3 Lubricant: sometimes   PREGNANCY: Vaginal deliveries 2 Tearing Yes: small Episiotomy No C-section deliveries 0 Currently pregnant No  PROLAPSE: None   OBJECTIVE:  Note: Objective measures were completed at Evaluation unless otherwise noted. 04/09/24: PFIQ-7: 52 Trampoline bouncing: causes some leaking Good transversus abdominus contraction with exhale Requires verbal cues to reduce breath holding in more difficult exercises such as lateral lunges and sumo squats More difficulty achieving pelvic floor  muscle contraction in wide base of support     02/20/24: PATIENT SURVEYS:   PFIQ-7: 67  COGNITION: Overall cognitive status: Within functional limits for tasks assessed     SENSATION: Light touch: Appears intact   FUNCTIONAL TESTS:  Squat: WNL Single leg stance:  Rt: pelvic drop  Lt: pelvic drop Curl-up test: abdominal distortion throughout upper and lower  GAIT: Assistive device utilized: None Comments: WNL  POSTURE: rounded shoulders, forward head, decreased lumbar lordosis, increased thoracic kyphosis, and posterior pelvic tilt   LUMBARAROM/PROM:  A/PROM A/PROM  Eval (% available)  Flexion 100  Extension 75  Right lateral flexion 75  Left lateral flexion 75  Right rotation 75  Left rotation 75   (Blank rows = not tested)  PALPATION:   General: Lt upper chest scar tissue restriction  Pelvic Alignment: posterior pelvic tilt  Abdominal: tension throughout abdomen                External Perineal Exam: Lt labial whiteness in tissue starting between clitoral hood and labia majora descending down to labia minor/major attachment - slightly tender - pt shown with mirror                             Internal Pelvic Floor: mild atrophy, no tenderness  Patient confirms identification and approves PT to assess internal pelvic floor and treatment Yes  PELVIC MMT:   MMT eval  Vaginal 2/5, 4 second hold, 7 repeat contractions  Diastasis Recti 3 finger width diastasis   (Blank rows = not tested)        TONE: low  PROLAPSE: WNL  TODAY'S TREATMENT:                                                                                                                              DATE:  05/19/24 Neuromuscular re-education: Bridge + unilateral chest press + 10 lbs 2 x 10 bil Full supine shoulder flexion + 10 lbs + alternating march 2 x 10 Half kneeling lifts + 7 lbs 2 x 10 bil Bridges on foam roller 2 x 10 Standing in single let dead lift with hip rotation (airplane  to half moon) 10x bil Exercises: Ball up wall 10x Lower trunk rotation 2 x 10 Resisted open books + green band 10x bil Therapeutic activities: Running man 20x bil Single leg dead lift with row + 10lbs 10x bil In and out squats 10 lbs 2 x 10   05/12/24 Neuromuscular re-education: Trampoline exercises: Side to side bouncing 2 min Staggered stance bouncing 2 min bil Straight up and down bouncing 2 min Wall ball marching + yellow ball 2 x 20 Standing chop +10 lbs 2 x 10 bil Standing reverse chop + 7lbs 2 x 10 bil Therapeutic activities: Sumo Squat 2 x 10 15 lb kettle bell Single leg dead lift with row + 10lbs 10x bil Forward/backward walk outs + 7lbs bil 10x Running man 2 x 10 Pallof press + rotation + green band 10x bil   04/09/24 Neuromuscular re-education: Modified dead bug making sure she leaves one of her feet down to help support and manage pressure better 10x bil Trampoline exercises: Side to side bouncing 2 min Staggered stance bouncing 2 min bil Straight up and down bouncing 2 min Therapeutic activities:  Lateral lunges in wide stance 10x bil 5 lbs  Sumo Squat 2 x 10 15 lb kettle bell Staggered stance unilateral row with rotation 7 lbs on pulley 2 x 10 bil Pallof press red band 2 x 10 bil Bil shoulder extensions red band 2 x 10   PATIENT EDUCATION:  Education details: See above Person educated: Patient Education method: Explanation, Demonstration, Tactile cues, Verbal cues, and Handouts Education comprehension: verbalized understanding  HOME EXERCISE PROGRAM: [id number]  ASSESSMENT:  CLINICAL IMPRESSION: Pt reports no changes since last session, but she has been more consistent with exercises. Pt doing very well overall; however, no changes since last week. Believe she will see more change as she is more consistent with exercises. She did well with previous exercise progressions and addition to more standing and challenging bed exercises today demonstrating  appropriate challenge. She tolerated the variety of balance and functional strength training well. She will continue to benefit from skilled PT intervention in order to decrease urinary urgency/incontinence an distress incontinence, address all impairments, improve quality of life, and progress functional strengthening program.   OBJECTIVE IMPAIRMENTS: decreased activity tolerance, decreased coordination, decreased endurance, decreased mobility, decreased ROM, decreased strength, increased fascial restrictions, increased muscle spasms, impaired flexibility, impaired tone, improper body mechanics, postural dysfunction, and pain.   ACTIVITY LIMITATIONS: lifting, bending, and continence  PARTICIPATION LIMITATIONS: cleaning, laundry, interpersonal relationship, and community activity  PERSONAL FACTORS: 1 comorbidity: medical history are also affecting patient's functional outcome.   REHAB POTENTIAL: Good  CLINICAL DECISION MAKING: Stable/uncomplicated  EVALUATION COMPLEXITY: Low   GOALS: Goals reviewed with patient? Yes  SHORT TERM GOALS: Updated 05/12/24   Pt will be independent with HEP.   Baseline: Goal status: Met 03/24/24  2.  Pt will report 25% improvement in urinary incontinence in order to decrease anxiety with travel.  Baseline: 30-40% better Goal status: In progress 04/09/24  3.  Pt will be able to teach back and utilize urge suppression technique in order to help reduce number of trips to the bathroom.    Baseline:  Goal status: Met 03/24/24  4.  Pt will be independent with the knack in order to reduce stress urinary incontinence.  Baseline:  Goal status: Met 03/24/24  5.  Pt will increase pelvic floor muscle strength to 3/5 in order to reduce urinary incontinence with laughing, coughing, and sneezing.  Baseline: not formally assessed today Goal status: In progress 05/12/24   LONG TERM GOALS: Updated 05/12/24  Pt will be independent with advanced HEP.   Baseline:   Goal status: In progress 05/12/24  2.  Pt will report 75% improvement in urinary incontinence in order to decrease anxiety with travel.  Baseline: 30-40% - PFIQ-7 improved from 67 to 52 Goal status: In progress 05/12/24  3.  Pt will be able to go 2-3 hours in between voids without urgency or incontinence in order to improve QOL and perform all functional activities with less difficulty.   Baseline: every 30 minutes in the morning still, but 3-4 hours in the afternoon Goal status: In progress 05/12/24  4.  Pt will increase pelvic floor muscle endurance to greater than 10 seconds in order to decrease urinary incontinence what laughing, coughing, and sneezing.  Baseline: not formally assessed today Goal status: In progress 05/12/24  5.  Pt will report use of no more than one light pad a day. Baseline: still using 2 a day, but feels like there is less in the pads Goal status: In progress 05/12/24  6.  Pt will report no apprehension with traveling due to fear of finding bathroom or leaking.  Baseline:  Goal status: MET 05/12/24  PLAN:  PT FREQUENCY: 1-2x/week  PT DURATION: 6 months   PLANNED INTERVENTIONS: 97110-Therapeutic exercises, 97530- Therapeutic activity, 97112- Neuromuscular re-education, 97535- Self Care, [id number]- Manual therapy, Dry Needling, and Biofeedback  PLAN FOR NEXT SESSION: progress core training and progress into more challenging functional strengthening exercises   Josette Mares, PT, DPT10/01/2509:50 AM

## 2024-06-23 ENCOUNTER — Ambulatory Visit: Attending: Hematology

## 2024-06-23 VITALS — Wt 155.1 lb

## 2024-06-23 DIAGNOSIS — Z483 Aftercare following surgery for neoplasm: Secondary | ICD-10-CM | POA: Insufficient documentation

## 2024-06-23 NOTE — Therapy (Addendum)
 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, LCIS   DIAPHORESIS 12/27/2009   Qualifier: Diagnosis of  By: Gwenn Grimes     Hypertension    Palpitations 12/27/2009   no current problems, Qualifier: Diagnosis of  By: Barnes, Kimalexis   SUPRAVENTRICULAR TACHYCARDIA 05/21/2020   had ablation - Qualifier: Diagnosis of  By: Gwenn Grimes   Past Surgical History:  Procedure Laterality Date   BREAST BIOPSY Left 12/14/2021   BREAST ENHANCEMENT SURGERY Right 01/19/2023   Procedure: MAMMOPLASTY AUGMENTATION (BREAST);  Surgeon: Arelia Filippo, MD;  Location: [facility];  Service: Plastics;  Laterality: Right;   BREAST RECONSTRUCTION WITH PLACEMENT OF TISSUE EXPANDER AND ALLODERM Left 01/31/2022   Procedure: BREAST RECONSTRUCTION WITH PLACEMENT OF TISSUE EXPANDER AND ALLODERM;  Surgeon: Arelia Filippo, MD;  Location: [facility];  Service: Plastics;  Laterality: Left;   CARPAL TUNNEL WITH CUBITAL TUNNEL Bilateral    EYE SURGERY  2006   INCISION AND DRAINAGE OF WOUND Left 02/18/2022   Procedure: IRRIGATION AND DEBRIDEMENT WOUND-LEFT BREAST;  Surgeon: Arelia Filippo, MD;  Location: [facility];  Service: Plastics;  Laterality: Left;   KNEE SURGERY     MASTECTOMY W/ SENTINEL NODE BIOPSY Left 01/31/2022   Procedure: LEFT NIPPLE SPARING MASTECTOMY AND SENTINEL LYMPH NODE BIOPSY;   Surgeon: Vernetta Berg, MD;  Location: [facility];  Service: General;  Laterality: Left;   REFRACTIVE SURGERY     REMOVAL OF TISSUE EXPANDER AND PLACEMENT OF IMPLANT Left 01/19/2023   Procedure: REMOVAL OF TISSUE EXPANDER AND PLACEMENT OF IMPLANT;  Surgeon: Arelia Filippo, MD;  Location: [facility];  Service: Plastics;  Laterality: Left;   SVT ABLATION N/A 05/21/2020   Procedure: SVT ABLATION;  Surgeon: Cindie Ole DASEN, MD;  Location: [facility] INVASIVE CV LAB;  Service: Cardiovascular;  Laterality: N/A;   TISSUE EXPANDER PLACEMENT Left 03/24/2022   Procedure: TISSUE EXPANDER - REMOVAL AND REPLACEMENT;  Surgeon: Arelia Filippo, MD;  Location: [facility];  Service: Plastics;  Laterality: Left;   TRIGGER FINGER RELEASE Right    TRIGGER FINGER RELEASE Left 01/15/2023   at [facility]   WISDOM TOOTH EXTRACTION     Patient Active Problem List   Diagnosis Date Noted   Osteopenia after menopause 12/14/2022   Breast cancer, left breast (HCC) 01/31/2022   Malignant neoplasm of upper-outer quadrant of left breast in female, estrogen receptor positive (HCC) 12/26/2021   SVT (supraventricular tachycardia) (HCC) 12/27/2009   DIAPHORESIS 12/27/2009   PALPITATIONS 12/27/2009   SHORTNESS OF BREATH 12/27/2009    REFERRING DIAG: left breast cancer at risk for lymphedema  THERAPY DIAG: Aftercare following surgery for neoplasm  PERTINENT HISTORY: Pt had a left nipple sparing mastectomy with expander placement on 02/08/22 with 1 negative  LN removed. 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 Radiation completed. On the letrozole .  Will have switch to implant on 01/19/23.   PRECAUTIONS: left UE Lymphedema risk, None  SUBJECTIVE: Pt returns for her first 6 month L-Dex screen.   PAIN:  Are you having pain? No  SOZO SCREENING: Patient was assessed today using the SOZO machine to determine the lymphedema index score. This was compared to  her baseline score. It was determined that she is within the recommended range when compared to her baseline and no further action is needed at this time. She will continue SOZO screenings. These are done every 3 months for 2 years post operatively followed by every 6 months for 2 years, and then annually.   L-DEX FLOWSHEETS - 06/23/24 1000       L-DEX LYMPHEDEMA SCREENING   Measurement Type Unilateral    L-DEX MEASUREMENT EXTREMITY Upper Extremity    POSITION  Standing    DOMINANT SIDE Right    At Risk Side Left    BASELINE SCORE (UNILATERAL) 0.1    L-DEX SCORE (UNILATERAL) -0.9    VALUE CHANGE (UNILAT) -1          P: Transition to 6 month L-Dex screens until 01/2026.  Aden Berwyn Caldron, PTA 06/23/2024, 10:09 AM

## 2024-10-10 ENCOUNTER — Ambulatory Visit: Admitting: Nurse Practitioner

## 2024-10-10 ENCOUNTER — Other Ambulatory Visit

## 2024-12-22 ENCOUNTER — Ambulatory Visit: Attending: Hematology
